# Patient Record
Sex: Male | Born: 1945 | Race: White | Hispanic: No | Marital: Married | Smoking: Former smoker
Health system: Southern US, Community
[De-identification: ages and names within clinical notes are randomized; demographics above are authoritative.]

## PROBLEM LIST (undated history)

## (undated) DIAGNOSIS — I10 Essential (primary) hypertension: Secondary | ICD-10-CM

## (undated) DIAGNOSIS — M1990 Unspecified osteoarthritis, unspecified site: Secondary | ICD-10-CM

## (undated) DIAGNOSIS — Z87442 Personal history of urinary calculi: Secondary | ICD-10-CM

## (undated) DIAGNOSIS — C641 Malignant neoplasm of right kidney, except renal pelvis: Secondary | ICD-10-CM

## (undated) DIAGNOSIS — G8929 Other chronic pain: Secondary | ICD-10-CM

## (undated) DIAGNOSIS — M545 Low back pain, unspecified: Secondary | ICD-10-CM

## (undated) DIAGNOSIS — C61 Malignant neoplasm of prostate: Secondary | ICD-10-CM

## (undated) DIAGNOSIS — M4800 Spinal stenosis, site unspecified: Secondary | ICD-10-CM

## (undated) DIAGNOSIS — E781 Pure hyperglyceridemia: Secondary | ICD-10-CM

## (undated) DIAGNOSIS — B1910 Unspecified viral hepatitis B without hepatic coma: Secondary | ICD-10-CM

## (undated) HISTORY — PX: CYST EXCISION: SHX5701

## (undated) HISTORY — PX: FEMUR SURGERY: SHX943

## (undated) HISTORY — PX: JOINT REPLACEMENT: SHX530

## (undated) HISTORY — PX: FRACTURE SURGERY: SHX138

---

## 1980-03-04 HISTORY — PX: MASTOIDECTOMY: SHX711

## 1985-03-04 HISTORY — PX: FINGER SURGERY: SHX640

## 1999-08-22 ENCOUNTER — Encounter: Payer: Self-pay | Admitting: Orthopedic Surgery

## 1999-08-29 ENCOUNTER — Encounter: Payer: Self-pay | Admitting: Orthopedic Surgery

## 1999-08-29 ENCOUNTER — Inpatient Hospital Stay (HOSPITAL_COMMUNITY): Admission: RE | Admit: 1999-08-29 | Discharge: 1999-09-03 | Payer: Self-pay | Admitting: Orthopedic Surgery

## 2000-03-04 HISTORY — PX: TOTAL HIP ARTHROPLASTY: SHX124

## 2005-12-17 ENCOUNTER — Ambulatory Visit (HOSPITAL_COMMUNITY): Admission: RE | Admit: 2005-12-17 | Discharge: 2005-12-17 | Payer: Self-pay | Admitting: Orthopedic Surgery

## 2006-03-04 DIAGNOSIS — B1910 Unspecified viral hepatitis B without hepatic coma: Secondary | ICD-10-CM

## 2006-03-04 HISTORY — DX: Unspecified viral hepatitis B without hepatic coma: B19.10

## 2006-03-04 HISTORY — PX: INGUINAL HERNIA REPAIR: SUR1180

## 2006-10-03 DIAGNOSIS — C61 Malignant neoplasm of prostate: Secondary | ICD-10-CM

## 2006-10-03 HISTORY — DX: Malignant neoplasm of prostate: C61

## 2006-10-03 HISTORY — PX: PROSTATECTOMY: SHX69

## 2008-03-04 HISTORY — PX: CARPAL TUNNEL RELEASE: SHX101

## 2008-10-26 ENCOUNTER — Ambulatory Visit (HOSPITAL_BASED_OUTPATIENT_CLINIC_OR_DEPARTMENT_OTHER): Admission: RE | Admit: 2008-10-26 | Discharge: 2008-10-26 | Payer: Self-pay | Admitting: Orthopedic Surgery

## 2010-06-09 LAB — POCT I-STAT, CHEM 8
BUN: 27 mg/dL — ABNORMAL HIGH (ref 6–23)
Calcium, Ion: 1.11 mmol/L — ABNORMAL LOW (ref 1.12–1.32)
Chloride: 102 mEq/L (ref 96–112)
Creatinine, Ser: 0.8 mg/dL (ref 0.4–1.5)
Glucose, Bld: 103 mg/dL — ABNORMAL HIGH (ref 70–99)
HCT: 41 % (ref 39.0–52.0)
Hemoglobin: 13.9 g/dL (ref 13.0–17.0)
Potassium: 2.8 mEq/L — ABNORMAL LOW (ref 3.5–5.1)
Sodium: 141 mEq/L (ref 135–145)
TCO2: 30 mmol/L (ref 0–100)

## 2010-07-17 NOTE — Op Note (Signed)
NAME:  Jonathan Harvey, Jonathan Harvey NO.:  [id number]   MEDICAL RECORD NO.:  [id number]          PATIENT TYPE:  AMB   LOCATION:  DSC                          FACILITY:  [facility]   PHYSICIAN:  Feliberto Gottron. Turner Daniels, M.D.   DATE OF BIRTH:  August 20, 1945   DATE OF PROCEDURE:  10/26/2008  DATE OF DISCHARGE:                               OPERATIVE REPORT   PREOPERATIVE DIAGNOSIS:  Right carpal tunnel syndrome.   POSTOPERATIVE DIAGNOSIS:  Right carpal tunnel syndrome.   PROCEDURE:  Right carpal tunnel release.   SURGEON:  Feliberto Gottron. Turner Daniels, MD   FIRST ASSISTANT:  Shirl Harris, PA-C   ANESTHESIA:  Bier block.   ESTIMATED BLOOD LOSS:  Minimal.   FLUID REPLACEMENT:  500 mL of crystalloid.   DRAINS PLACED:  None.   TOURNIQUET TIME:  None.   INDICATIONS FOR PROCEDURE:  A 65 year old gentleman with fairly classic  clinical right carpal tunnel syndrome who has failed conservative  treatment with observation, splinting, anti-inflammatory medicines.  It  wakes him up 7/7 nights per week.  He desires elective right carpal  tunnel release to decrease pain and increase functioning.  He does not  have any thenar muscle atrophy.  Risks and benefits of surgery were  discussed, questions answered.   DESCRIPTION OF PROCEDURE:  The patient identified by armband and taken  to the operating room at [facility] after receiving  preoperative IV antibiotics.  Tourniquet was applied high to the right  arm, and right Bier block anesthetic was induced.  The right upper  extremity prepped and draped in the usual sterile fashion from the  fingertips to the mid forearm and time-out procedure performed.  We then  began the operation itself by making a palmar longitudinal incision  starting at the wrist flexion crease going distally for about 3 or 4 cm  through the skin and subcutaneous tissue.  We then made a small incision  in the palmar fascia distally allowing passage of a Therapist, nutritional into  the carpal tunnel.  We then cut down the [place], which was kept  on the volar aspect of the tunnel to protect the nerve performing the  carpal tunnel release, which was then extended up into the forearm  fascia with a black handled scissors.  This allowed [place] to thoroughly  probed the carpal tunnel.  The median nerve was noted to be intact.  There was an area where it was necked down into an hour glass  configuration as the one under the transverse carpal ligament.  At this  point, the wound was irrigated out with normal saline solution and then  the skin  only was closed with running 4-0 nylon suture, and a dressing of  Xeroform, 4 x 4 dressing sponges, Webril, and an Ace wrap applied.  The  patient was then taken to the recovery room without difficulty after the  tourniquet was let down.      Feliberto Gottron. Turner Daniels, M.D.  Electronically Signed     FJR/MEDQ  D:  10/26/2008  T:  10/27/2008  Job:  [id number] 

## 2010-07-20 NOTE — Op Note (Signed)
[place]  Patient:    CASMIR, AUGUSTE                         MRN: [id number] Proc. Date: 08/29/99 Adm. 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  It was fixed with Knowles pins that are still in place. Because of increasing pain and stiffness and inability to ambulate, as well as about a 1-1/2 inch leg length discrepancy, half that being an apparent leg length discrepancy, desires total hip arthroplasty.  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  It was fixed with Knowles pins that are still in place.  Because of increasing pain and stiffness and inability to ambulate, as well as about a 1-1/2 inch leg length discrepancy, half that being an apparent leg length discrepancy, desires total hip arthroplasty.  PROCEDURE:  Left total hip arthroplasty after removal of the Knowles pins.  We used the SROM system for the stem side with a 20 x 15 x 42 x 160 stem.  A 20S large cone, a NK+0 28 mm Cobalt chrome ball, a 52 mm Depuy dual locking cup with a 10 degree liner index posterior and superior.  ANESTHESIA:  General endotracheal.  SURGEON:  Alinda Deem, M.D.  FIRST ASSISTANT:  Dorthula Matas, P.A.-C.  SECOND ASSISTANT:  Mamie Levers, P.A. student.  ESTIMATED BLOOD LOSS:  750 cc.  FLUID REPLACEMENT:  2600 cc of crystalloid.  DRAINS PLACED:  Two medium Hemovacs and a Foley catheter.  URINE OUTPUT:  300 cc.  INDICATIONS FOR PROCEDURE:  A 65 year old man with severe degenerative arthritis of the left hip.  He has failed conservative treatment and anti-inflammatory  medicines, judicious use of narcotics, and use of a cane. He has had this arthritis for many, many years with a large amount of hypertrophic _______ around the pelvis.  He desires elective total hip arthroplasty to decrease pain and increase functioning, and he has first ______ motion at the hip joint as it is now.  DESCRIPTION OF PROCEDURE:  The patient was identified by arm band and taken to the operating room at [facility].  Appropriate anesthetic monitors were attached and general endotracheal anesthesia induced with the patient in the supine position.  A Foley catheter was placed.  He was rolled up on to his right side and _________ Loraine Leriche II pelvic clamp in the right lateral decubitus position.  The left lower extremity was then prepped and draped in the usual sterile fashion from the ankle to the hemipelvis, and the skin along the lateral hip and thigh infiltrated with 20 cc of 0.5% Marcaine and epinephrine solution.  A 25 cm incision centered over the greater trochanter, allowing a lateral to posterolateral approach was then made through the skin and subcutaneous tissue down to the level of the iliotibial band as it ran over the greater trochanter.  This was then incised in line with the skin incision, exposing the greater trochanter and the vastus lateralis.  We were able to palpate the screw  heads of the Knowles pins through the vastus lateralis and these fibers were split, and then using a chisel, we had to cut out two of the screw heads and then using the Knowles pin and wrench, extracted the screws without difficulty.  We then directed attention to the posterior aspect of the hip and took down the posterior capsule, at least what was left of it.  There was virtually no capsule and carved out large amounts of osteophytes, some of them, almost an inch in size from the posterior aspect of the acetabulum and the femoral head and neck region.  Once this had been accomplished, we  were actually able to dislocate the femoral head and also the large anterior osteophytes were removed as well.  Once we had carved off, the osteophytes were identified in the femoral neck, and performed a standard neck cut one fingerbreadth above the lesser trochanter, removing the huge femoral head that was about double the normal volume of the femoral head one would expect.  The patient actually had a pseudoacetabulum that had a good 1/4 inch thick bone around most of the rim, and after trimming it so that it was anteverted about 15-20 degrees, we then sequentially reamed the pseudoacetabulum up to 56 mm, obtaining good bony contact in all areas.  We reamed up to 55 mm with the hemispherical reamer and then hammered into place a 56 mm dual lock shell in 20 degrees of anteversion, and placed a 10 degree liner in the shell after placing the occluder first. An excellent fit was accomplished and no supplemental screws were necessary.  We continued to trim excess bone from around the periphery and the acetabulum, and then flexed and internally rotated the proximal femur, and once again removed huge osteophytes from around the femoral neck, some of them almost 3/4 of an inch in width.  We identified the old femoral neck using the box chisel at the proximal femur followed by sequential reaming up to a 15 mm axial reamer to the appropriate depth through an SROM stem, and then reamed half way down with a 15.5 cylindrical reamer.  Conical reaming was then accomplished up to a 20S cone followed by calcar milling to a 20 large calcar. A 20S large trial cone was then placed with a 15 mm diameter stem x 42 base neck, and NK+0 and NK+6, 28 mm trial balls were placed.  The NK+6 gave [place] the best leg length recovery, although he still probably is a good half inch short.  The hip was stable at 90 of flexion of 60 of internal rotation, and full extension and external rotation of about 30 degrees, and could  not be dislocated anteriorly.  At this point, the trial components were removed and the femur was once again sounded with the cone extractor, and then a 46F large ZTT cone was hammered  into place followed by a 20 x 15 x 42 x 160 stem in the same version as the cone, and an NK+6, 28 mm Cobalt chrome ball was hammered on the stem.  The hip once again reduced and stability found to be excellent.  Because of the amount of dissection and removal of heterotopic bone, medium Hemovacs were placed in the wound to help remove excess bone.  The wound was thoroughly irrigated with normal saline solution.  There was no posterior capsule repair.  The iliotibial band was repaired with running #1 Vicryl suture, the subcutaneous tissue with 0 and 2-0 undyed Vicryl suture, the  skin with running interlocking 3-0 nylon suture.  A dressing of Xeroform followed by a dressing, sponges, and Hypafix tape applied.  The patient was rolled supine and an abduction pillow placed.  He was awakened and taken to the recovery room without difficulty. DD:  08/29/99 TD:  08/30/99 Job: [id number]

## 2010-07-20 NOTE — Discharge Summary (Signed)
[place]  Patient:    Jonathan Harvey, Jonathan Harvey                         MRN: [id number] Adm. Date:  [id number] Disc. Date: [id number] Attending:  Alinda Deem Dictator:   Dorthula Matas, P.A.-C.                           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  SECONDARY DIAGNOSES: 1. History of viral hepatitis B. 2. Hypertension.  PROCEDURES:  Left total hip arthroplasty.  DISCHARGE SUMMARY/INTRODUCTION:  The patient is a 65 year old male admitted for treatment of end-stage DJD of the left hip.  HISTORY OF PRESENT ILLNESS:  The patient has a multi-year history of increasing pain of the left hip after an injury as a child, much worse x the last 20 months.  He has trouble sleeping.  He cannot do house chores and cannot work.  He has start up pain and profound limp.  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  The patient uses a cane or crutch.  He cannot do exercises because of pain.  Unable to fully extend hip.  The risks and benefits of total hip arthroplasty were reviewed and the patient understands and desires left total hip.  X-ray showed positive femoral head collapse with osteophytes and DJD changes with positive retained pins from previous surgery.  SOCIAL HISTORY:  Occasional ethanol.  No tobacco.  No IV drug abuse.  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  PAST MEDICAL HISTORY:  Normal childhood diseases.  FAMILY HISTORY:  Mother is alive and well at age 70 with a history of cardiac disease and DJD.  Father alive and well at age 68 with a history of CVA and hypertension.  ADULT DISEASES:  Hypertension and DJD.  PAST SURGICAL HISTORY:  Hip surgery in 1962.  Mastoidectomy in 1980.  Leg fracture in 1957.  ALLERGIES:  No known  difficulties with general endotracheal anesthesia.  No known drug allergies.  MEDICATIONS ON ADMISSION: 1. Vioxx 25 mg 1 p.o. q.d. 2. Hyzaar 100/25 1 p.o. q.d. 3. Rondec-TR SA 1 p.o. q.d. 4. Tetracycline 500 mg 1 p.o. q.d. 5. Aspirin 80 mg 1 p.o. q.d.  REVIEW OF SYSTEMS:  Positive for glasses and decreased hearing.  Denies shortness of breath, chest pain, or bleeding from mouth or rectum.  No recent UTIs, free bleeding, or glandular diseases.  PHYSICAL EXAMINATION:  VITAL SIGNS:  The patient is a 7 foot 1 inch, 190 pound male.  Blood pressure 140/88.  HEENT:  Head is normocephalic, atraumatic.  Eyes:  PERRLA, ______.  Ears: TMs are clear.  Nose is clear.  No polyps.  Throat is clear.  NECK:  Supple.  Full range of motion.  RESPIRATORY:  Clear to auscultation and percussion.  CARDIOVASCULAR:  Regular rate and rhythm.  No murmurs, rubs, or gallops.  ABDOMEN:  Soft, nontender.  No masses.  Bowel sounds 2+.  GENITOURINARY/RECTAL:  Not performed.  NEUROLOGIC:  Alert and oriented x 3.  Normal speech pattern.  Cranial nerves II-XII grossly intact.  ORTHOPEDIC:  Left hip 30 degree forward flexion retracture, ______ attempts at motion and foot tap.  Range of motion of the left hip internal rotation 10, right hip internal rotation 30.  External rotation left is 30 and right 45. Abduction 20 and 40.  Adduction 10 and 20.  Neurovascularly intact to light touch and pinprick.  DTRs 2+.  Pulses distally 2+.  LABORATORY DATA:  Preoperative labs, including CBC, CMET, chest x-ray, EKG, PT, and PTT were all within normal limits with the exception of EKG which showed right bundle branch block.  HOSPITAL COURSE:  On the day of admission, the patient was taken to the operating room at [facility], where he underwent a left total hip arthroplasty after removal of old pins from the left hip.  The patient received an ______ system with a stem 20 x 15 x 42 x 160, a 20 S large  cone, an NK+0 20 mm cobalt chrome ball, a 52 mm dual locking cup by DePuy, and a 10-degree liner index posterior and superior.  The patient was placed on perioperative antibiotics.  He was placed on postoperative Coumadin per pharmacy protocol.  Foley catheter was placed in bladder and medium Hemovac double arm was placed in the wound.  He was begun on postoperative PT the day of surgery.  He was placed on postoperative PCA for pain control. First postoperative day, the patient was awake and alert in moderate pain. Vital signs were stable.  He was afebrile, hemoglobin 10, neurovascularly intact.  Dressing was dry and otherwise doing well postoperatively.  The second postoperative day, the patient was without complaint.  ______ and physical therapy.  He was afebrile.  Vital signs were stable.  PT 19.1, INR of 2.1.  The dressing was dry.  Hemovac was quiescent and discontinued without difficulty.  Continue with physical therapy.  The third postoperative day, the patient was without complaint, taking p.o., and voiding without difficulty. Afebrile, vital signs stable.  The wound was benign, otherwise neurovascularly intact.  Hemoglobin 8.1, hematocrit 21.9, INR 2.2.  The dressing was changed. Continue with Coumadin and discharge planning continued.  The fourth postoperative day, the patient was awake and alert.  Decreased pain, otherwise improving.  On the fifth postoperative day, the patient was awake and alert and in moderate pain.  He was afebrile.  The wound was clean and dry.  He was neurovascularly intact and medically stable.  He was discharged home to the care of his family on that date.  ACTIVITY:  He is to continue being weight-bearing as tolerated with total hip precautions on the left side.  WOUND CARE:  He will do dressing changes q.d.  FOLLOW-UP:  Return to see [place] in one weeks time for followup check and possible suture removal.  DISCHARGE MEDICATIONS:  He will continue with  Coumadin per pharmacy protocol  for two weeks postoperatively.  He was given Tylox for pain control and will resume home preoperative medications.  DISCHARGE INSTRUCTIONS:  He should return to see [place] in one weeks time, as mentioned, or sooner if he is having difficulty with drainage, increased pain, or fever over 101. DD:  09/17/99 TD:  09/17/99 Job: [id number]

## 2012-01-10 DIAGNOSIS — M4802 Spinal stenosis, cervical region: Secondary | ICD-10-CM | POA: Insufficient documentation

## 2012-07-01 ENCOUNTER — Ambulatory Visit
Admission: RE | Admit: 2012-07-01 | Discharge: 2012-07-01 | Disposition: A | Discharge status: Home / Self Care | Payer: BC Managed Care – PPO | Source: Ambulatory Visit | Attending: Orthopedic Surgery | Admitting: Orthopedic Surgery

## 2012-07-01 ENCOUNTER — Other Ambulatory Visit: Payer: Self-pay | Admitting: Orthopedic Surgery

## 2012-07-01 DIAGNOSIS — C61 Malignant neoplasm of prostate: Secondary | ICD-10-CM | POA: Insufficient documentation

## 2012-07-01 DIAGNOSIS — N529 Male erectile dysfunction, unspecified: Secondary | ICD-10-CM | POA: Insufficient documentation

## 2012-07-01 DIAGNOSIS — R102 Pelvic and perineal pain: Secondary | ICD-10-CM

## 2012-07-01 DIAGNOSIS — R3129 Other microscopic hematuria: Secondary | ICD-10-CM | POA: Insufficient documentation

## 2015-09-19 ENCOUNTER — Other Ambulatory Visit: Payer: Self-pay | Admitting: Orthopedic Surgery

## 2015-09-19 ENCOUNTER — Ambulatory Visit
Admission: RE | Admit: 2015-09-19 | Discharge: 2015-09-19 | Disposition: A | Discharge status: Home / Self Care | Payer: Medicare Other | Source: Ambulatory Visit | Attending: Orthopedic Surgery | Admitting: Orthopedic Surgery

## 2015-09-19 DIAGNOSIS — M8950 Osteolysis, unspecified site: Secondary | ICD-10-CM

## 2016-03-20 ENCOUNTER — Ambulatory Visit: Payer: Medicare Other | Admitting: Physical Therapy

## 2016-03-26 ENCOUNTER — Ambulatory Visit: Payer: Medicare Other | Attending: Orthopedic Surgery | Admitting: Physical Therapy

## 2016-03-26 DIAGNOSIS — M25661 Stiffness of right knee, not elsewhere classified: Secondary | ICD-10-CM | POA: Diagnosis present

## 2016-03-26 DIAGNOSIS — M25561 Pain in right knee: Secondary | ICD-10-CM | POA: Diagnosis not present

## 2016-03-26 DIAGNOSIS — G8929 Other chronic pain: Secondary | ICD-10-CM | POA: Diagnosis present

## 2016-03-26 NOTE — Therapy (Signed)
[facility] [place], [place], [address] Phone: [phone]   Fax:  [phone]  Physical Therapy Evaluation  Patient Details  Name: Jonathan Harvey MRN: [id number] Date of Birth: October 16, 1945 Referring Provider: [name] MD.  Encounter Date: 03/26/2016      PT End of Session - 03/26/16 1339    Visit Number 1   Number of Visits 8   Date for PT Re-Evaluation 04/23/16   PT Start Time 1115   PT Stop Time 1204   PT Time Calculation (min) 49 min   Activity Tolerance Patient tolerated treatment well   Behavior During Therapy [facility] for tasks assessed/performed      No past medical history on file.  No past surgical history on file.  There were no vitals filed for this visit.       Subjective Assessment - 03/26/16 1323    Pertinent History Sciatica.  Leg length discrepancy.  Left knee pain.            [facility] PT Assessment - 03/26/16 0001      Assessment   Medical Diagnosis Right knee pain.   Referring Provider [name] MD.   Onset Date/Surgical Date --  Years.     Precautions   Precautions None     Restrictions   Weight Bearing Restrictions No     Balance Screen   Has the patient fallen in the past 6 months No   Has the patient had a decrease in activity level because of a fear of falling?  No   Is the patient reluctant to leave their home because of a fear of falling?  No     Home Environment   Living Environment Private residence     Prior Function   Level of Independence Independent     Observation/Other Assessments   Observations --  Left shoe lift.  Severe loss of RT patellar mobility.     Posture/Postural Control   Posture Comments Bilateral hip ER.  Right tibial rotation.  Right patella sits laterally.  Right genu valgum.  Right LE significantly longer than left.     ROM / Strength   AROM / PROM / Strength AROM;Strength     AROM   Overall AROM Comments -25 degrees of right knee  extension and flexion= 95 degrees and right hip flexion= 105 degrees.     Strength   Overall Strength Comments Right hip strength= 4-/5; right knee strength is a solid 4+/5.     Palpation   Palpation comment Pain "in" right knee.     Special Tests    Special Tests --  Valgus instability.     Transfers   Transfers --  Independent.     Ambulation/Gait   Gait Comments The patient ambulating with a cane with right knee in excessiove valgum and right tibial ER with a Trendelenburg gait pattern noted.                     [facility] [facility] PT Treatment/Exercise - 03/26/16 0001      Exercises   Exercises Knee/Hip     Knee/Hip Exercises: Aerobic   Nustep Level 3 x 15 minutes moving forward x 1 to increase knee flexion.                  PT Short Term Goals - 03/26/16 1305      PT SHORT TERM GOAL #1   Title STG's=LTG's.  PT Long Term Goals - 03/26/16 1305      PT LONG TERM GOAL #1   Title Independent with a HEP prior to surgery next month.   Time 3   Period Weeks   Status New               Plan - 03/26/16 1330    Clinical Impression Statement The patient presents to OPPT with a great deal of right knee pain.  His pain is increasing and his function is decreasing.  He has a significant loss of right hip strength and loss of both right knee flexion and extension.  He has a signifcant gait deviation, leg length discrepancy and right knee instability and a severe loss of right patellar mobility all of which decrease his level of function.   Rehab Potential Good   Clinical Impairments Affecting Rehab Potential Right knee flexion contracture.   PT Frequency 2x / week   PT Duration 3 weeks   PT Treatment/Interventions ADLs/Self Care Home Management;Therapeutic activities;Therapeutic exercise;Neuromuscular re-education;Patient/family education;Manual techniques   PT Next Visit Plan Nustep; Left knee ROM; right hip strengthening.      Patient  will benefit from skilled therapeutic intervention in order to improve the following deficits and impairments:  Abnormal gait, Decreased activity tolerance, Decreased range of motion, Decreased strength, Difficulty walking, Pain  Visit Diagnosis: Chronic pain of right knee - Plan: PT plan of care cert/re-cert  Stiffness of right knee, not elsewhere classified - Plan: PT plan of care cert/re-cert      G-Codes - [id number]    Functional Assessment Tool Used 51% limitation.   Functional Limitation Mobility: Walking and moving around   Mobility: Walking and Moving Around Current Status [phone]) At least 40 percent but less than 60 percent impaired, limited or restricted   Mobility: Walking and Moving Around Goal Status [phone]) At least 20 percent but less than 40 percent impaired, limited or restricted       Problem List There are no active problems to display for this patient.   Juli Odom, [place] MPT 03/26/2016, 1:47 PM  [facility] [address] [place], [place], [address] Phone: [phone]   Fax:  [phone]  Name: Jonathan Harvey MRN: [id number] Date of Birth: 20-Mar-1945

## 2016-03-29 ENCOUNTER — Ambulatory Visit: Payer: Medicare Other | Admitting: Physical Therapy

## 2016-03-29 DIAGNOSIS — M25561 Pain in right knee: Secondary | ICD-10-CM | POA: Diagnosis not present

## 2016-03-29 DIAGNOSIS — M25661 Stiffness of right knee, not elsewhere classified: Secondary | ICD-10-CM

## 2016-03-29 DIAGNOSIS — G8929 Other chronic pain: Secondary | ICD-10-CM

## 2016-03-29 NOTE — Therapy (Signed)
[facility] [place], [place], [address] Phone: [phone]   Fax:  [phone]  Physical Therapy Treatment  Patient Details  Name: Jonathan Harvey MRN: [id number] Date of Birth: 1946-03-02 Referring Provider: [name] MD.  Encounter Date: 03/29/2016      PT End of Session - 03/29/16 1010    Visit Number 2   Number of Visits 8   Date for PT Re-Evaluation 04/23/16   PT Start Time 0910      No past medical history on file.  No past surgical history on file.  There were no vitals filed for this visit.  Treatment:  Nustep level 4 x 15 minutes; supine 5# overpressure stretch x 5 minute; rockerboard x 5 minutes; 10# knee extension stretch x 4 minutes                               PT Short Term Goals - 03/26/16 1305      PT SHORT TERM GOAL #1   Title STG's=LTG's.           PT Long Term Goals - 03/26/16 1305      PT LONG TERM GOAL #1   Title Independent with a HEP prior to surgery next month.   Time 3   Period Weeks   Status New             Patient will benefit from skilled therapeutic intervention in order to improve the following deficits and impairments:     Visit Diagnosis: Chronic pain of right knee  Stiffness of right knee, not elsewhere classified     Problem List There are no active problems to display for this patient.   Rion Schnitzer, [place] MPT 03/29/2016, 10:12 AM  [facility] [address] [place], [place], [address] Phone: [phone]   Fax:  [phone]  Name: Jonathan Harvey MRN: [id number] Date of Birth: 03-20-1945

## 2016-04-01 ENCOUNTER — Ambulatory Visit: Payer: Medicare Other | Admitting: Physical Therapy

## 2016-04-01 ENCOUNTER — Encounter: Payer: Self-pay | Admitting: Physical Therapy

## 2016-04-01 DIAGNOSIS — M25561 Pain in right knee: Principal | ICD-10-CM

## 2016-04-01 DIAGNOSIS — G8929 Other chronic pain: Secondary | ICD-10-CM

## 2016-04-01 DIAGNOSIS — M25661 Stiffness of right knee, not elsewhere classified: Secondary | ICD-10-CM

## 2016-04-01 NOTE — Therapy (Signed)
[facility] [place], [place], [address] Phone: [phone]   Fax:  [phone]  Physical Therapy Treatment  Patient Details  Name: Jonathan Harvey MRN: [id number] Date of Birth: 02-29-1946 Referring Provider: [name] MD.  Encounter Date: 04/01/2016      PT End of Session - 04/01/16 1522    Visit Number 3   Number of Visits 8   Date for PT Re-Evaluation 04/23/16   PT Start Time [id number]   PT Stop Time 1528   PT Time Calculation (min) 44 min   Activity Tolerance Patient tolerated treatment well   Behavior During Therapy [place] for tasks assessed/performed      History reviewed. No pertinent past medical history.  History reviewed. No pertinent surgical history.  There were no vitals filed for this visit.      Subjective Assessment - 04/01/16 1448    Subjective Patient going to see ortho MD tomorrow re: left hip pain/previous THR (bone growing over onto femoral head component) ongoing pain knee in right LE   Pertinent History Sciatica.  Leg length discrepancy.  Left knee pain.   Limitations Walking   How long can you walk comfortably? Short community distances.   Patient Stated Goals Reduce overall pain.   Currently in Pain? Yes   Pain Score 4    Pain Location Knee   Pain Orientation Right   Pain Descriptors / Indicators Aching   Pain Type Chronic pain   Pain Onset More than a month ago   Pain Frequency Constant   Aggravating Factors  prolong activity   Pain Relieving Factors at rest            [facility] PT Assessment - 04/01/16 0001      ROM / Strength   AROM / PROM / Strength PROM;AROM     AROM   AROM Assessment Site Knee   Right/Left Knee Right   Right Knee Extension -19   Right Knee Flexion 95     PROM   PROM Assessment Site Knee   Right/Left Knee Right   Right Knee Extension -14   Right Knee Flexion 105                     [facility] Adult PT Treatment/Exercise - 04/01/16 0001       Knee/Hip Exercises: Aerobic   Nustep 37min L4 UE/LE     Knee/Hip Exercises: Standing   Lateral Step Up 2 sets;Right;10 reps;Step Height: 6"   Forward Step Up Right;2 sets;10 reps;Step Height: 6"   Rocker Board 3 minutes     Knee/Hip Exercises: Supine   Other Supine Knee/Hip Exercises hip abd x20 with red t-band     Knee/Hip Exercises: Sidelying   Hip ABduction Strengthening;Right;10 reps;1 set     Manual Therapy   Manual Therapy Passive ROM   Passive ROM gentle PROM to right knee flexion and ext with gentle holds                  PT Short Term Goals - 03/26/16 1305      PT SHORT TERM GOAL #1   Title STG's=LTG's.           PT Long Term Goals - 04/01/16 1523      PT LONG TERM GOAL #1   Title Independent with a HEP prior to surgery next month.   Time 3   Period Weeks   Status On-going  Plan - 04/01/16 1523    Clinical Impression Statement Patient tolerated treatment well today. Patient reported less pain with exercises. Patient able to complete all exercises with no difficulty. Patient has reported doing self stretches per MPT instruction. Current goal ongoing.    Rehab Potential Good   Clinical Impairments Affecting Rehab Potential Right knee flexion contracture.   PT Frequency 2x / week   PT Duration 3 weeks   PT Treatment/Interventions ADLs/Self Care Home Management;Therapeutic activities;Therapeutic exercise;Neuromuscular re-education;Patient/family education;Manual techniques   PT Next Visit Plan Nustep; Left knee ROM; right hip strengthening.   PT Home Exercise Plan establish HEP next treatment   Consulted and Agree with Plan of Care Patient      Patient will benefit from skilled therapeutic intervention in order to improve the following deficits and impairments:  Abnormal gait, Decreased activity tolerance, Decreased range of motion, Decreased strength, Difficulty walking, Pain  Visit Diagnosis: Chronic pain of right  knee  Stiffness of right knee, not elsewhere classified     Problem List There are no active problems to display for this patient.   Phillips Climes, PTA 04/01/2016, 3:28 PM  [facility] [address] [place], [place], [address] Phone: [phone]   Fax:  [phone]  Name: Jonathan Harvey MRN: [id number] Date of Birth: 18-Dec-1945

## 2016-04-03 ENCOUNTER — Encounter: Payer: Medicare Other | Admitting: Physical Therapy

## 2016-04-10 ENCOUNTER — Other Ambulatory Visit: Payer: Self-pay | Admitting: Orthopedic Surgery

## 2016-04-16 ENCOUNTER — Inpatient Hospital Stay (HOSPITAL_COMMUNITY): Admission: RE | Admit: 2016-04-16 | Payer: Medicare Other | Source: Ambulatory Visit

## 2016-04-18 ENCOUNTER — Encounter (HOSPITAL_COMMUNITY)
Admission: RE | Admit: 2016-04-18 | Discharge: 2016-04-18 | Disposition: A | Discharge status: Home / Self Care | Payer: Medicare Other | Source: Ambulatory Visit | Attending: Orthopedic Surgery | Admitting: Orthopedic Surgery

## 2016-04-18 ENCOUNTER — Encounter (HOSPITAL_COMMUNITY): Admission: RE | Admit: 2016-04-18 | Payer: Medicare Other | Source: Ambulatory Visit

## 2016-04-18 ENCOUNTER — Encounter (HOSPITAL_COMMUNITY): Payer: Self-pay

## 2016-04-18 DIAGNOSIS — I4510 Unspecified right bundle-branch block: Secondary | ICD-10-CM | POA: Insufficient documentation

## 2016-04-18 DIAGNOSIS — Z01818 Encounter for other preprocedural examination: Secondary | ICD-10-CM | POA: Diagnosis not present

## 2016-04-18 DIAGNOSIS — Z01812 Encounter for preprocedural laboratory examination: Secondary | ICD-10-CM | POA: Diagnosis not present

## 2016-04-18 DIAGNOSIS — Z0183 Encounter for blood typing: Secondary | ICD-10-CM | POA: Diagnosis not present

## 2016-04-18 HISTORY — DX: Essential (primary) hypertension: I10

## 2016-04-18 HISTORY — DX: Personal history of urinary calculi: Z87.442

## 2016-04-18 HISTORY — DX: Unspecified osteoarthritis, unspecified site: M19.90

## 2016-04-18 LAB — BASIC METABOLIC PANEL
ANION GAP: 13 (ref 5–15)
BUN: 24 mg/dL — ABNORMAL HIGH (ref 6–20)
CO2: 29 mmol/L (ref 22–32)
Calcium: 9.6 mg/dL (ref 8.9–10.3)
Chloride: 100 mmol/L — ABNORMAL LOW (ref 101–111)
Creatinine, Ser: 1.41 mg/dL — ABNORMAL HIGH (ref 0.61–1.24)
GFR calc non Af Amer: 50 mL/min — ABNORMAL LOW (ref >60)
GFR, EST AFRICAN AMERICAN: 58 mL/min — AB (ref >60)
Glucose, Bld: 100 mg/dL — ABNORMAL HIGH (ref 65–99)
POTASSIUM: 3.2 mmol/L — AB (ref 3.5–5.1)
SODIUM: 142 mmol/L (ref 135–145)

## 2016-04-18 LAB — CBC WITH DIFFERENTIAL/PLATELET
BASOS ABS: 0 10*3/uL (ref 0.0–0.1)
Basophils Relative: 0 %
Eosinophils Absolute: 0.4 10*3/uL (ref 0.0–0.7)
Eosinophils Relative: 8 %
HEMATOCRIT: 38.2 % — AB (ref 39.0–52.0)
Hemoglobin: 13.2 g/dL (ref 13.0–17.0)
LYMPHS PCT: 34 %
Lymphs Abs: 1.9 10*3/uL (ref 0.7–4.0)
MCH: 31.1 pg (ref 26.0–34.0)
MCHC: 34.6 g/dL (ref 30.0–36.0)
MCV: 89.9 fL (ref 78.0–100.0)
Monocytes Absolute: 0.4 10*3/uL (ref 0.1–1.0)
Monocytes Relative: 8 %
NEUTROS ABS: 2.7 10*3/uL (ref 1.7–7.7)
Neutrophils Relative %: 50 %
PLATELETS: 275 10*3/uL (ref 150–400)
RBC: 4.25 MIL/uL (ref 4.22–5.81)
RDW: 13.4 % (ref 11.5–15.5)
WBC: 5.5 10*3/uL (ref 4.0–10.5)

## 2016-04-18 LAB — TYPE AND SCREEN
ABO/RH(D): A POS
ANTIBODY SCREEN: NEGATIVE

## 2016-04-18 LAB — SURGICAL PCR SCREEN
MRSA, PCR: NEGATIVE
Staphylococcus aureus: NEGATIVE

## 2016-04-18 LAB — PROTIME-INR
INR: 1.06
Prothrombin Time: 13.8 seconds (ref 11.4–15.2)

## 2016-04-18 LAB — URINALYSIS, ROUTINE W REFLEX MICROSCOPIC
BILIRUBIN URINE: NEGATIVE
GLUCOSE, UA: NEGATIVE mg/dL
HGB URINE DIPSTICK: NEGATIVE
Ketones, ur: NEGATIVE mg/dL
Leukocytes, UA: NEGATIVE
Nitrite: NEGATIVE
Protein, ur: NEGATIVE mg/dL
SPECIFIC GRAVITY, URINE: 1.009 (ref 1.005–1.030)
pH: 6 (ref 5.0–8.0)

## 2016-04-18 LAB — APTT: APTT: 38 s — AB (ref 24–36)

## 2016-04-18 LAB — ABO/RH: ABO/RH(D): A POS

## 2016-04-18 NOTE — Pre-Procedure Instructions (Signed)
Jonathan Harvey  04/18/2016      [facility] [place], [place] [address] Phone: [phone] Fax: [phone]    Your procedure is scheduled on  04-24-2016   Wednesday .  Report to [facility] Admitting at 10:45 A.M.   Call this number if you have problems the morning of surgery:  [phone]   Remember:  Do not eat food or drink liquids after midnight.   Take these medicines the morning of surgery with A SIP OF WATER Zyrtec,gabapentin(neurontin),metoprolol,Tramadol     Do not wear jewelry,  Do not wear lotions, powders, or perfumes, or deoderant.  Do not shave 48 hours prior to surgery.  Men may shave face and neck.   Do not bring valuables to the hospital.  [facility] is not responsible for any belongings or valuables.  Contacts, dentures or bridgework may not be worn into surgery.  Leave your suitcase in the car.  After surgery it may be brought to your room.  For patients admitted to the hospital, discharge time will be determined by your treatment team.  Patients discharged the day of surgery will not be allowed to drive home.   [place] - Preparing for Surgery  Before surgery, you can play an important role.  Because skin is not sterile, your skin needs to be as free of germs as possible.  You can reduce the number of germs on you skin by washing with CHG (chlorahexidine gluconate) soap before surgery.  CHG is an antiseptic cleaner which kills germs and bonds with the skin to continue killing germs even after washing.  Please DO NOT use if you have an allergy to CHG or antibacterial soaps.  If your skin becomes reddened/irritated stop using the CHG and inform your nurse when you arrive at Short Stay.  Do not shave (including legs and underarms) for at least 48 hours prior to the first CHG shower.  You may shave your face.  Please follow these instructions carefully:   1.  Shower with CHG  Soap the night before surgery and the                                morning of Surgery.  2.  If you choose to wash your hair, wash your hair first as usual with your  normal shampoo.  3.  After you shampoo, rinse your hair and body thoroughly to remove the                      Shampoo.  4.  Use CHG as you would any other liquid soap.  You can apply chg directly  to the skin and wash gently with scrungie or a clean washcloth.  5.  Apply the CHG Soap to your body ONLY FROM THE NECK DOWN.        Do not use on open wounds or open sores.  Avoid contact with your eyes,       ears, mouth and genitals (private parts).  Wash genitals (private parts)       with your normal soap.  6.  Wash thoroughly, paying special attention to the area where your surgery   will be performed.  7.  Thoroughly rinse your body with warm water from the neck down.  8.  DO NOT shower/wash with your normal soap after  using and rinsing off  the CHG Soap.  9.  Pat yourself dry with a clean towel.            10.  Wear clean pajamas.            11.  Place clean sheets on your bed the night of your first shower and do not  sleep with pets.  Day of Surgery  Do not apply any lotions/deoderants the morning of surgery.  Please wear clean clothes to the hospital/surgery center.   Special instructions: STOP ASPIRIN,ANTIINFLAMATORIES (IBUPROFEN,ALEVE,MOTRIN,ADVIL,GOODY'S POWDERS),HERBAL SUPPLEMENTS,FISH OIL,AND VITAMINS 5-7 DAYS PRIOR TO SURGERY  .

## 2016-04-23 MED ORDER — CEFAZOLIN SODIUM-DEXTROSE 2-4 GM/100ML-% IV SOLN
2.0000 g | INTRAVENOUS | Status: AC
  Administered 2016-04-24: 2 g via INTRAVENOUS
  Filled 2016-04-23: qty 100

## 2016-04-23 MED ORDER — TRANEXAMIC ACID 1000 MG/10ML IV SOLN
2000.0000 mg | INTRAVENOUS | Status: AC
  Administered 2016-04-24: 2000 mg via TOPICAL
  Filled 2016-04-23: qty 20

## 2016-04-23 NOTE — H&P (Signed)
TOTAL HIP REVISION ADMISSION H&P  Patient is admitted for left revision total hip arthroplasty.  Subjective:  Chief Complaint: left hip pain  HPI: Jonathan Harvey, 71 y.o. male, has a history of pain and functional disability in the left hip due to arthritis and patient has failed non-surgical conservative treatments for greater than 12 weeks to include NSAID's and/or analgesics, flexibility and strengthening excercises, use of assistive devices and activity modification. The indications for the revision total hip arthroplasty are bearing surface wear leading to  implant or hip misalignment.  Onset of symptoms was gradual starting 10 years ago with gradually worsening course since that time.  Prior procedures on the left hip include arthroplasty.  Patient currently rates pain in the left hip at 10 out of 10 with activity.  There is night pain, worsening of pain with activity and weight bearing, trendelenberg gait, pain that interfers with activities of daily living and pain with passive range of motion. Patient has evidence of joint space narrowing by imaging studies.  This condition presents safety issues increasing the risk of falls.   There is no current active infection.  There are no active problems to display for this patient.  Past Medical History:  Diagnosis Date  . Arthritis   . Blood dyscrasia    prostectomy    . Hepatitis    Hepatitis B  2008  . History of kidney stones   . Hypertension     Past Surgical History:  Procedure Laterality Date  . BREAST LUMPECTOMY Left    lump remove from left arm  . CARPAL TUNNEL RELEASE Right 2010  . curved femur Left   . HAND SURGERY Right 1987   right thumb and index finger  . HERNIA REPAIR  2008   inguineal  . JOINT REPLACEMENT Left 2002   hip replacement  . MASTOIDECTOMY  1982  . radical prostectomy      No prescriptions prior to admission.   Allergies  Allergen Reactions  . Lactose Intolerance (Gi) Other (See Comments)    Mild GI  upset    Social History  Substance Use Topics  . Smoking status: Never Smoker  . Smokeless tobacco: Former Systems developer  . Alcohol use 8.4 oz/week    14 Cans of beer per week    No family history on file.    Review of Systems  Constitutional: Negative.   HENT: Positive for hearing loss.   Eyes: Negative.        Poor vision  Respiratory: Positive for shortness of breath.   Cardiovascular: Negative.   Gastrointestinal: Negative.   Genitourinary: Negative.        Prostate cancer  Musculoskeletal: Positive for joint pain.  Skin: Negative.   Neurological: Negative.   Endo/Heme/Allergies: Negative.   Psychiatric/Behavioral: Negative.     Objective:  Physical Exam  Constitutional: He is oriented to person, place, and time. He appears well-developed and well-nourished.  HENT:  Head: Normocephalic and atraumatic.  Eyes: Pupils are equal, round, and reactive to light.  Neck: Normal range of motion. Neck supple.  Cardiovascular: Intact distal pulses.   Respiratory: Effort normal.  Musculoskeletal: He exhibits tenderness.  he continues to have mild irritability in the left hip with range of motion.  Mild tenderness laterally.  No instability.  Calves are soft and nontender.  He is neurovascularly intact distally.  Neurological: He is alert and oriented to person, place, and time.  Skin: Skin is warm and dry.  Psychiatric: He has a normal mood  and affect. His behavior is normal. Judgment and thought content normal.    Vital signs in last 24 hours:     Labs:   Estimated body mass index is 30.03 kg/m as calculated from the following:   Height as of 04/18/16: 5\' 8"  (1.727 m).   Weight as of 04/18/16: 89.6 kg (197 lb 8 oz).  Imaging Review:  Plain radiographs demonstrate AP of the pelvis and crosstable lateral of the left hip are also taken and reviewed in office today.  Patient does appear to have bearing surface wear with superior migration of the femoral head.  Patient also has an  old greater troche fracture.  Assessment/Plan:  End stage arthritis, left hip(s) with failed previous arthroplasty.  The patient history, physical examination, clinical judgement of the provider and imaging studies are consistent with end stage degenerative joint disease of the left hip(s), previous total hip arthroplasty. Revision total hip arthroplasty is deemed medically necessary. The treatment options including medical management, injection therapy, arthroscopy and arthroplasty were discussed at length. The risks and benefits of total hip arthroplasty were presented and reviewed. The risks due to aseptic loosening, infection, stiffness, dislocation/subluxation,  thromboembolic complications and other imponderables were discussed.  The patient acknowledged the explanation, agreed to proceed with the plan and consent was signed. Patient is being admitted for inpatient treatment for surgery, pain control, PT, OT, prophylactic antibiotics, VTE prophylaxis, progressive ambulation and ADL's and discharge planning. The patient is planning to be discharged home with home health services

## 2016-04-24 ENCOUNTER — Inpatient Hospital Stay (HOSPITAL_COMMUNITY)
Admission: RE | Admit: 2016-04-24 | Discharge: 2016-04-26 | DRG: 467 | Disposition: A | Discharge status: Home Health | Payer: Medicare Other | Source: Ambulatory Visit | Attending: Orthopedic Surgery | Admitting: Orthopedic Surgery

## 2016-04-24 ENCOUNTER — Inpatient Hospital Stay (HOSPITAL_COMMUNITY): Payer: Medicare Other

## 2016-04-24 ENCOUNTER — Inpatient Hospital Stay (HOSPITAL_COMMUNITY): Payer: Medicare Other | Admitting: Certified Registered Nurse Anesthetist

## 2016-04-24 ENCOUNTER — Encounter (HOSPITAL_COMMUNITY)
Admission: RE | Disposition: A | Discharge status: Home Health | Payer: Self-pay | Source: Ambulatory Visit | Attending: Orthopedic Surgery

## 2016-04-24 ENCOUNTER — Encounter (HOSPITAL_COMMUNITY): Payer: Self-pay | Admitting: *Deleted

## 2016-04-24 ENCOUNTER — Inpatient Hospital Stay: Admit: 2016-04-24 | Payer: Medicare Other | Admitting: Orthopedic Surgery

## 2016-04-24 DIAGNOSIS — K759 Inflammatory liver disease, unspecified: Secondary | ICD-10-CM | POA: Diagnosis present

## 2016-04-24 DIAGNOSIS — I10 Essential (primary) hypertension: Secondary | ICD-10-CM | POA: Diagnosis present

## 2016-04-24 DIAGNOSIS — T84061A Wear of articular bearing surface of internal prosthetic left hip joint, initial encounter: Principal | ICD-10-CM | POA: Diagnosis present

## 2016-04-24 DIAGNOSIS — Y838 Other surgical procedures as the cause of abnormal reaction of the patient, or of later complication, without mention of misadventure at the time of the procedure: Secondary | ICD-10-CM | POA: Diagnosis present

## 2016-04-24 DIAGNOSIS — E739 Lactose intolerance, unspecified: Secondary | ICD-10-CM | POA: Diagnosis present

## 2016-04-24 DIAGNOSIS — Z87442 Personal history of urinary calculi: Secondary | ICD-10-CM

## 2016-04-24 DIAGNOSIS — Z87891 Personal history of nicotine dependence: Secondary | ICD-10-CM

## 2016-04-24 DIAGNOSIS — M25552 Pain in left hip: Secondary | ICD-10-CM | POA: Diagnosis present

## 2016-04-24 DIAGNOSIS — M1612 Unilateral primary osteoarthritis, left hip: Secondary | ICD-10-CM | POA: Diagnosis present

## 2016-04-24 DIAGNOSIS — Z01818 Encounter for other preprocedural examination: Secondary | ICD-10-CM

## 2016-04-24 DIAGNOSIS — Z96649 Presence of unspecified artificial hip joint: Secondary | ICD-10-CM

## 2016-04-24 DIAGNOSIS — D62 Acute posthemorrhagic anemia: Secondary | ICD-10-CM | POA: Diagnosis not present

## 2016-04-24 HISTORY — PX: TOTAL HIP REVISION: SHX763

## 2016-04-24 SURGERY — TOTAL HIP REVISION
Anesthesia: General | Site: Hip | Laterality: Left

## 2016-04-24 SURGERY — ARTHROPLASTY, KNEE, TOTAL
Anesthesia: Spinal | Laterality: Right

## 2016-04-24 MED ORDER — HYDROMORPHONE HCL 1 MG/ML IJ SOLN: 0.2500 mg | INTRAMUSCULAR | Status: DC | PRN

## 2016-04-24 MED ORDER — ALUMINUM HYDROXIDE GEL 320 MG/5ML PO SUSP
15.0000 mL | ORAL | Status: DC | PRN
  Filled 2016-04-24: qty 30

## 2016-04-24 MED ORDER — DOCUSATE SODIUM 100 MG PO CAPS
100.0000 mg | ORAL_CAPSULE | Freq: Two times a day (BID) | ORAL | Status: DC
  Administered 2016-04-24 – 2016-04-26 (×4): 100 mg via ORAL
  Filled 2016-04-24 (×4): qty 1

## 2016-04-24 MED ORDER — ACETAMINOPHEN 650 MG RE SUPP: 650.0000 mg | Freq: Four times a day (QID) | RECTAL | Status: DC | PRN

## 2016-04-24 MED ORDER — ONDANSETRON HCL 4 MG PO TABS: 4.0000 mg | ORAL_TABLET | Freq: Four times a day (QID) | ORAL | Status: DC | PRN

## 2016-04-24 MED ORDER — ONDANSETRON HCL 4 MG/2ML IJ SOLN: 4.0000 mg | Freq: Four times a day (QID) | INTRAMUSCULAR | Status: DC | PRN

## 2016-04-24 MED ORDER — MIDAZOLAM HCL 2 MG/2ML IJ SOLN
INTRAMUSCULAR | Status: AC
  Filled 2016-04-24: qty 2

## 2016-04-24 MED ORDER — ACETAMINOPHEN 325 MG PO TABS: 650.0000 mg | ORAL_TABLET | Freq: Four times a day (QID) | ORAL | Status: DC | PRN

## 2016-04-24 MED ORDER — METHOCARBAMOL 500 MG PO TABS
500.0000 mg | ORAL_TABLET | Freq: Four times a day (QID) | ORAL | Status: DC | PRN
  Administered 2016-04-26: 500 mg via ORAL
  Filled 2016-04-24: qty 1

## 2016-04-24 MED ORDER — SUGAMMADEX SODIUM 200 MG/2ML IV SOLN
INTRAVENOUS | Status: DC | PRN
  Administered 2016-04-24: 200 mg via INTRAVENOUS

## 2016-04-24 MED ORDER — OXYCODONE-ACETAMINOPHEN 5-325 MG PO TABS: 1.0000 | ORAL_TABLET | ORAL | 0 refills | Status: DC | PRN

## 2016-04-24 MED ORDER — MIDAZOLAM HCL 2 MG/2ML IJ SOLN
INTRAMUSCULAR | Status: DC | PRN
  Administered 2016-04-24: 2 mg via INTRAVENOUS

## 2016-04-24 MED ORDER — ASPIRIN EC 325 MG PO TBEC
325.0000 mg | DELAYED_RELEASE_TABLET | Freq: Every day | ORAL | Status: DC
  Administered 2016-04-25 – 2016-04-26 (×2): 325 mg via ORAL
  Filled 2016-04-24 (×2): qty 1

## 2016-04-24 MED ORDER — DIPHENHYDRAMINE HCL 12.5 MG/5ML PO ELIX: 12.5000 mg | ORAL_SOLUTION | ORAL | Status: DC | PRN

## 2016-04-24 MED ORDER — ROCURONIUM BROMIDE 10 MG/ML (PF) SYRINGE
PREFILLED_SYRINGE | INTRAVENOUS | Status: DC | PRN
  Administered 2016-04-24: 50 mg via INTRAVENOUS
  Administered 2016-04-24 (×2): 10 mg via INTRAVENOUS

## 2016-04-24 MED ORDER — LIDOCAINE 2% (20 MG/ML) 5 ML SYRINGE
INTRAMUSCULAR | Status: DC | PRN
  Administered 2016-04-24: 100 mg via INTRAVENOUS

## 2016-04-24 MED ORDER — OXYCODONE HCL 5 MG PO TABS
5.0000 mg | ORAL_TABLET | ORAL | Status: DC | PRN
  Administered 2016-04-24 – 2016-04-25 (×3): 10 mg via ORAL
  Administered 2016-04-26: 5 mg via ORAL
  Administered 2016-04-26: 10 mg via ORAL
  Filled 2016-04-24: qty 1
  Filled 2016-04-24 (×4): qty 2

## 2016-04-24 MED ORDER — DEXAMETHASONE SODIUM PHOSPHATE 10 MG/ML IJ SOLN
10.0000 mg | Freq: Once | INTRAMUSCULAR | Status: AC
  Administered 2016-04-25: 10 mg via INTRAVENOUS
  Filled 2016-04-24: qty 1

## 2016-04-24 MED ORDER — TRANEXAMIC ACID 1000 MG/10ML IV SOLN
1000.0000 mg | INTRAVENOUS | Status: AC
  Administered 2016-04-24: 1000 mg via INTRAVENOUS
  Filled 2016-04-24: qty 10

## 2016-04-24 MED ORDER — MENTHOL 3 MG MT LOZG: 1.0000 | LOZENGE | OROMUCOSAL | Status: DC | PRN

## 2016-04-24 MED ORDER — LOSARTAN POTASSIUM 50 MG PO TABS
100.0000 mg | ORAL_TABLET | Freq: Every day | ORAL | Status: DC
  Administered 2016-04-26: 100 mg via ORAL
  Filled 2016-04-24 (×2): qty 2

## 2016-04-24 MED ORDER — ROCURONIUM BROMIDE 50 MG/5ML IV SOSY
PREFILLED_SYRINGE | INTRAVENOUS | Status: AC
  Filled 2016-04-24: qty 5

## 2016-04-24 MED ORDER — LIDOCAINE 2% (20 MG/ML) 5 ML SYRINGE
INTRAMUSCULAR | Status: AC
  Filled 2016-04-24: qty 5

## 2016-04-24 MED ORDER — HYDROCHLOROTHIAZIDE 25 MG PO TABS
25.0000 mg | ORAL_TABLET | Freq: Every day | ORAL | Status: DC
  Administered 2016-04-26: 25 mg via ORAL
  Filled 2016-04-24 (×2): qty 1

## 2016-04-24 MED ORDER — PROPOFOL 10 MG/ML IV BOLUS
INTRAVENOUS | Status: DC | PRN
  Administered 2016-04-24: 150 mg via INTRAVENOUS

## 2016-04-24 MED ORDER — METOCLOPRAMIDE HCL 5 MG PO TABS
5.0000 mg | ORAL_TABLET | Freq: Three times a day (TID) | ORAL | Status: DC | PRN
Start: 2016-04-24 — End: 2016-04-26

## 2016-04-24 MED ORDER — PROPOFOL 10 MG/ML IV BOLUS
INTRAVENOUS | Status: AC
  Filled 2016-04-24: qty 20

## 2016-04-24 MED ORDER — SODIUM CHLORIDE 0.9 % IR SOLN
Status: DC | PRN
  Administered 2016-04-24: 1

## 2016-04-24 MED ORDER — BUPIVACAINE-EPINEPHRINE (PF) 0.5% -1:200000 IJ SOLN
INTRAMUSCULAR | Status: AC
  Filled 2016-04-24: qty 30

## 2016-04-24 MED ORDER — METOCLOPRAMIDE HCL 5 MG/ML IJ SOLN: 5.0000 mg | Freq: Three times a day (TID) | INTRAMUSCULAR | Status: DC | PRN

## 2016-04-24 MED ORDER — LACTATED RINGERS IV SOLN
INTRAVENOUS | Status: DC
  Administered 2016-04-24 (×2): via INTRAVENOUS

## 2016-04-24 MED ORDER — KCL IN DEXTROSE-NACL 20-5-0.45 MEQ/L-%-% IV SOLN
INTRAVENOUS | Status: DC
  Administered 2016-04-24: 21:00:00 via INTRAVENOUS
  Filled 2016-04-24 (×2): qty 1000

## 2016-04-24 MED ORDER — PHENYLEPHRINE HCL 10 MG/ML IJ SOLN
INTRAVENOUS | Status: DC | PRN
  Administered 2016-04-24: 25 ug/min via INTRAVENOUS

## 2016-04-24 MED ORDER — FENTANYL CITRATE (PF) 100 MCG/2ML IJ SOLN
INTRAMUSCULAR | Status: AC
  Filled 2016-04-24: qty 2

## 2016-04-24 MED ORDER — BUPIVACAINE LIPOSOME 1.3 % IJ SUSP
20.0000 mL | INTRAMUSCULAR | Status: AC
  Administered 2016-04-24: 20 mL
  Filled 2016-04-24: qty 20

## 2016-04-24 MED ORDER — ONDANSETRON HCL 4 MG/2ML IJ SOLN
INTRAMUSCULAR | Status: DC | PRN
  Administered 2016-04-24: 4 mg via INTRAVENOUS

## 2016-04-24 MED ORDER — SUCCINYLCHOLINE CHLORIDE 200 MG/10ML IV SOSY
PREFILLED_SYRINGE | INTRAVENOUS | Status: AC
  Filled 2016-04-24: qty 10

## 2016-04-24 MED ORDER — METHOCARBAMOL 1000 MG/10ML IJ SOLN
500.0000 mg | Freq: Four times a day (QID) | INTRAMUSCULAR | Status: DC | PRN
  Filled 2016-04-24: qty 5

## 2016-04-24 MED ORDER — LOSARTAN POTASSIUM-HCTZ 100-25 MG PO TABS: 1.0000 | ORAL_TABLET | Freq: Every day | ORAL | Status: DC

## 2016-04-24 MED ORDER — FLEET ENEMA 7-19 GM/118ML RE ENEM: 1.0000 | ENEMA | Freq: Once | RECTAL | Status: DC | PRN

## 2016-04-24 MED ORDER — PHENOL 1.4 % MT LIQD: 1.0000 | OROMUCOSAL | Status: DC | PRN

## 2016-04-24 MED ORDER — BUPIVACAINE-EPINEPHRINE 0.5% -1:200000 IJ SOLN
INTRAMUSCULAR | Status: DC | PRN
  Administered 2016-04-24: 30 mL

## 2016-04-24 MED ORDER — SODIUM CHLORIDE 0.9 % IV SOLN
2000.0000 mg | INTRAVENOUS | Status: AC
  Administered 2016-04-24: 2000 mg via TOPICAL
  Filled 2016-04-24: qty 20

## 2016-04-24 MED ORDER — PHENYLEPHRINE 40 MCG/ML (10ML) SYRINGE FOR IV PUSH (FOR BLOOD PRESSURE SUPPORT)
PREFILLED_SYRINGE | INTRAVENOUS | Status: DC | PRN
  Administered 2016-04-24 (×3): 80 ug via INTRAVENOUS

## 2016-04-24 MED ORDER — FENTANYL CITRATE (PF) 100 MCG/2ML IJ SOLN
INTRAMUSCULAR | Status: DC | PRN
  Administered 2016-04-24 (×2): 50 ug via INTRAVENOUS

## 2016-04-24 MED ORDER — LORATADINE 10 MG PO TABS
10.0000 mg | ORAL_TABLET | Freq: Every day | ORAL | Status: DC
  Administered 2016-04-24 – 2016-04-26 (×3): 10 mg via ORAL
  Filled 2016-04-24 (×2): qty 1

## 2016-04-24 MED ORDER — METOPROLOL SUCCINATE ER 25 MG PO TB24
25.0000 mg | ORAL_TABLET | Freq: Every day | ORAL | Status: DC
  Administered 2016-04-25 – 2016-04-26 (×2): 25 mg via ORAL
  Filled 2016-04-24 (×2): qty 1

## 2016-04-24 MED ORDER — ASPIRIN EC 325 MG PO TBEC: 325.0000 mg | DELAYED_RELEASE_TABLET | Freq: Two times a day (BID) | ORAL | 0 refills | Status: DC

## 2016-04-24 MED ORDER — TIZANIDINE HCL 2 MG PO TABS: 2.0000 mg | ORAL_TABLET | Freq: Four times a day (QID) | ORAL | 0 refills | Status: DC | PRN

## 2016-04-24 MED ORDER — GABAPENTIN 300 MG PO CAPS
300.0000 mg | ORAL_CAPSULE | Freq: Three times a day (TID) | ORAL | Status: DC
  Administered 2016-04-24 – 2016-04-26 (×5): 300 mg via ORAL
  Filled 2016-04-24 (×5): qty 1

## 2016-04-24 MED ORDER — CELECOXIB 200 MG PO CAPS
200.0000 mg | ORAL_CAPSULE | Freq: Two times a day (BID) | ORAL | Status: DC
  Administered 2016-04-24 – 2016-04-26 (×4): 200 mg via ORAL
  Filled 2016-04-24 (×4): qty 1

## 2016-04-24 MED ORDER — POLYETHYLENE GLYCOL 3350 17 G PO PACK: 17.0000 g | PACK | Freq: Every day | ORAL | Status: DC | PRN

## 2016-04-24 MED ORDER — DEXTROSE-NACL 5-0.45 % IV SOLN: INTRAVENOUS | Status: DC

## 2016-04-24 MED ORDER — BISACODYL 5 MG PO TBEC: 5.0000 mg | DELAYED_RELEASE_TABLET | Freq: Every day | ORAL | Status: DC | PRN

## 2016-04-24 MED ORDER — FUROSEMIDE 20 MG PO TABS
20.0000 mg | ORAL_TABLET | Freq: Every day | ORAL | Status: DC
  Administered 2016-04-24 – 2016-04-26 (×3): 20 mg via ORAL
  Filled 2016-04-24 (×3): qty 1

## 2016-04-24 MED ORDER — PHENYLEPHRINE 40 MCG/ML (10ML) SYRINGE FOR IV PUSH (FOR BLOOD PRESSURE SUPPORT)
PREFILLED_SYRINGE | INTRAVENOUS | Status: AC
  Filled 2016-04-24: qty 10

## 2016-04-24 MED ORDER — HYDROMORPHONE HCL 2 MG/ML IJ SOLN: 1.0000 mg | INTRAMUSCULAR | Status: DC | PRN

## 2016-04-24 SURGICAL SUPPLY — 71 items
BLADE SAW SAG 73X25 THK (BLADE)
BLADE SAW SGTL 73X25 THK (BLADE) IMPLANT
BRUSH FEMORAL CANAL (MISCELLANEOUS) IMPLANT
COVER SURGICAL LIGHT HANDLE (MISCELLANEOUS) ×3 IMPLANT
DRAPE C-ARM 42X72 X-RAY (DRAPES) IMPLANT
DRAPE ORTHO SPLIT 77X108 STRL (DRAPES) ×3
DRAPE PROXIMA HALF (DRAPES) ×3 IMPLANT
DRAPE SURG ORHT 6 SPLT 77X108 (DRAPES) ×1 IMPLANT
DRAPE U-SHAPE 47X51 STRL (DRAPES) ×3 IMPLANT
DRESSING AQUACEL AG SP 3.5X10 (GAUZE/BANDAGES/DRESSINGS) IMPLANT
DRILL BIT 7/64X5 (BIT) ×3 IMPLANT
DRSG AQUACEL AG ADV 3.5X10 (GAUZE/BANDAGES/DRESSINGS) ×3 IMPLANT
DRSG AQUACEL AG SP 3.5X10 (GAUZE/BANDAGES/DRESSINGS) ×3
DURAPREP 26ML APPLICATOR (WOUND CARE) ×3 IMPLANT
ELECT BLADE 4.0 EZ CLEAN MEGAD (MISCELLANEOUS)
ELECT BLADE 6.5 EXT (BLADE) ×2 IMPLANT
ELECT REM PT RETURN 9FT ADLT (ELECTROSURGICAL) ×3
ELECTRODE BLDE 4.0 EZ CLN MEGD (MISCELLANEOUS) IMPLANT
ELECTRODE REM PT RTRN 9FT ADLT (ELECTROSURGICAL) ×1 IMPLANT
EVACUATOR 1/8 PVC DRAIN (DRAIN) IMPLANT
GAUZE SPONGE 4X4 12PLY STRL (GAUZE/BANDAGES/DRESSINGS) ×3 IMPLANT
GAUZE XEROFORM 5X9 LF (GAUZE/BANDAGES/DRESSINGS) ×3 IMPLANT
GLOVE BIO SURGEON STRL SZ7.5 (GLOVE) ×3 IMPLANT
GLOVE BIO SURGEON STRL SZ8.5 (GLOVE) ×6 IMPLANT
GLOVE BIOGEL PI IND STRL 8 (GLOVE) ×2 IMPLANT
GLOVE BIOGEL PI IND STRL 9 (GLOVE) ×1 IMPLANT
GLOVE BIOGEL PI INDICATOR 8 (GLOVE) ×4
GLOVE BIOGEL PI INDICATOR 9 (GLOVE) ×2
GOWN STRL REUS W/ TWL LRG LVL3 (GOWN DISPOSABLE) ×1 IMPLANT
GOWN STRL REUS W/ TWL XL LVL3 (GOWN DISPOSABLE) ×2 IMPLANT
GOWN STRL REUS W/TWL LRG LVL3 (GOWN DISPOSABLE) ×3
GOWN STRL REUS W/TWL XL LVL3 (GOWN DISPOSABLE) ×6
HANDPIECE INTERPULSE COAX TIP (DISPOSABLE) ×3
HEAD SROM 36MM PLUS9 (Hips) IMPLANT
HOOD PEEL AWAY FACE SHEILD DIS (HOOD) ×6 IMPLANT
KIT BASIN OR (CUSTOM PROCEDURE TRAY) ×3 IMPLANT
KIT ROOM TURNOVER OR (KITS) ×3 IMPLANT
LINER MARATHON 41OD 36X56 (Hips) ×2 IMPLANT
MANIFOLD NEPTUNE II (INSTRUMENTS) ×3 IMPLANT
NDL 18GX1X1/2 (RX/OR ONLY) (NEEDLE) IMPLANT
NEEDLE 18GX1X1/2 (RX/OR ONLY) (NEEDLE) ×3 IMPLANT
NEEDLE 22X1 1/2 (OR ONLY) (NEEDLE) ×3 IMPLANT
NS IRRIG 1000ML POUR BTL (IV SOLUTION) ×6 IMPLANT
PACK TOTAL JOINT (CUSTOM PROCEDURE TRAY) ×3 IMPLANT
PAD ARMBOARD 7.5X6 YLW CONV (MISCELLANEOUS) ×6 IMPLANT
PASSER SUT SWANSON 36MM LOOP (INSTRUMENTS) ×2 IMPLANT
PRESSURIZER FEMORAL UNIV (MISCELLANEOUS) IMPLANT
RING LOCK ACET OD 56/68 (Hips) ×2 IMPLANT
SET HNDPC FAN SPRY TIP SCT (DISPOSABLE) IMPLANT
SLEEVE SURGEON STRL (DRAPES) IMPLANT
SPONGE LAP 18X18 X RAY DECT (DISPOSABLE) ×6 IMPLANT
SROM HEAD 36MM PLUS9 (Hips) ×3 IMPLANT
STAPLER VISISTAT 35W (STAPLE) ×3 IMPLANT
SUT ETHIBOND 2 V 37 (SUTURE) ×3 IMPLANT
SUT VIC AB 0 CTX 36 (SUTURE) ×6
SUT VIC AB 0 CTX36XBRD ANTBCTR (SUTURE) ×1 IMPLANT
SUT VIC AB 1 CTX 36 (SUTURE) ×6
SUT VIC AB 1 CTX36XBRD ANBCTR (SUTURE) ×1 IMPLANT
SUT VIC AB 2-0 CT1 27 (SUTURE) ×3
SUT VIC AB 2-0 CT1 TAPERPNT 27 (SUTURE) ×1 IMPLANT
SUT VIC AB 3-0 CT1 27 (SUTURE) ×3
SUT VIC AB 3-0 CT1 TAPERPNT 27 (SUTURE) ×1 IMPLANT
SWAB COLLECTION DEVICE MRSA (MISCELLANEOUS) ×3 IMPLANT
SYR 20ML ECCENTRIC (SYRINGE) ×3 IMPLANT
SYR CONTROL 10ML LL (SYRINGE) ×3 IMPLANT
TOWEL OR 17X24 6PK STRL BLUE (TOWEL DISPOSABLE) ×3 IMPLANT
TOWEL OR 17X26 10 PK STRL BLUE (TOWEL DISPOSABLE) ×3 IMPLANT
TOWER CARTRIDGE SMART MIX (DISPOSABLE) IMPLANT
TRAY FOLEY CATH 14FR (SET/KITS/TRAYS/PACK) ×2 IMPLANT
TUBE ANAEROBIC SPECIMEN COL (MISCELLANEOUS) ×3 IMPLANT
WATER STERILE IRR 1000ML POUR (IV SOLUTION) ×5 IMPLANT

## 2016-04-24 NOTE — Interval H&P Note (Signed)
History and Physical Interval Note:  04/24/2016 12:51 PM  Jonathan Harvey  has presented today for surgery, with the diagnosis of LEFT HIP POLY WEAR  The various methods of treatment have been discussed with the patient and family. After consideration of risks, benefits and other options for treatment, the patient has consented to  Procedure(s): TOTAL HIP REVISION (Left) as a surgical intervention .  The patient's history has been reviewed, patient examined, no change in status, stable for surgery.  I have reviewed the patient's chart and labs.  Questions were answered to the patient's satisfaction.     Kerin Salen

## 2016-04-24 NOTE — Op Note (Signed)
Preop diagnosis: Worn polyethylene liner with fracture of locking ring 71 year old left total hip   Postoperative diagnosis: Same  Procedure: Revision: Revision right total hip arthroplasty with removal of the 6 mm 10 fractured polyethylene liner and locking ring pieces Roman intact Duraloc shell we also removed the 28 mm 6 femoral head and revise to a new +4, 10 index posterior superior polyethylene liner with a new locking ring. On the femoral side we revised the femoral head component to a +936 mm femoral head.  Surgeon: Kathalene Frames. Mayer Camel M.D.  Assistant: Kerry Hough. Barton Dubois  (present throughout entire procedure and necessary for timely completion of the procedure)  Estimated blood loss: 400 cc  Fluid replacement: 1500 cc of crystalloid  Complications: None  Specimens: none  Indications: Patient had uncemented right total up arthroplasty by me 17 years ago and did well until the last couple of years when he was noted to have superior polyethylene wear in the last 6 months pain is gotten much worse x-ray show that the locking ring may be breaking and he is taken for revision of the polyethylene liner locking ring femoral head and any other components and may be damaged or loose. He is having significant pain and grinding and catching. Risks and benefits of surgery been discussed and all questions answered.    Procedure: Patient was identified by arm band receive preoperative IV antibiotics in the holding area at, [facility] hospital. Taken to the operating room where the appropriate anesthetic monitors were attached and general endotracheal anesthesia induced with the patient in the supine position. Rolled into the L lateral decubitus position and fixed there with a mark 2 pelvic clamp. The limb prepped and draped in usual sterile fashion from the ankle to the hemipelvis. Time out procedure performed. Skin along the lateral hip and thigh infiltrated with 10 cc of a mixture of 20 mL X roll and 30 mL  1/2% Marcaine and epinephrine solution. We began the operation by recreating the old posterior lateral incision 15 cm in line through the skin and subcutaneous tissue down to the level of the IT band which was cut in line with the skin incision. This exposed a gap between the greater trochanter and a trochanteric flare consistent with a trochanteric fracture he sustained a years ago but did well with nonoperative treatment. There is some metal staining of the tissues that was removed with electrocautery. We then took down the junction of the posterior pseudocapsule and greater trochanter encountering serosanguineous synovial fluid. The fluid was sent for Gram stain and culture although did not appear infected. We then took out posterior inferior and superior pseudocapsule which also calcifications and ossifications. We then dislocated a total hip and removed the femoral head with a mallet and metal cylinder.  We continued to remove scar tissue from around the acetabular component. The trunnion was tucked superiorly and anteriorly to the acetabular component, and the polyethylene liner was removed using the extraction tool. The O liner was noted to be fractured superiorly and the locking ring was fractured and pieces of it were found in the synovium and out along the greater trochanter. We then carefully cleared around the edges of the acetabular component in preparation for the a new liner and make sure that the locking mechanism was intact. At this point we tested the acetabular component to make sure was not loose it was not and a new 56 mm Duraloc polyethylene liner +4 mm, 10 liner index posterior superior was hammered into  place with a new locking ring and settled down firmly. Because the patient was short going into the procedure we selected a +9 mm 36 mm metal head to lengthen him a total of about 6 mm compared to where he was preoperatively. Stability was noted to 100 of flexion with 60 of internal  rotation.  The wound is irrigated out normal saline solution. The trochanteric fracture was reduced with a limb and abduction and #1 running Vicryl sutures were used to hold the reduction we anticipated will stretch out since this fractures a years old on the bone was relatively thin. The wound is thoroughly irrigated out with pulse lavage at this point and the rest of the expiratory limb Marcaine solution were injected into the soft tissues for anesthesia. We then closed the IT band with running #1 Vicryl suture, the subcutaneous tissue with 0 and 2-0 undyed Vicryl suture, the skin was closed with running interlocking 3-0 nylon suture. A dressing of Aquacel was then applied, the patient was unclamped a rolled supine awakened extubated and taken to the recovery without difficulty.

## 2016-04-24 NOTE — Discharge Instructions (Signed)

## 2016-04-24 NOTE — Transfer of Care (Signed)
Immediate Anesthesia Transfer of Care Note  Patient: Jonathan Harvey  Procedure(s) Performed: Procedure(s): TOTAL HIP REVISION (Left)  Patient Location: PACU  Anesthesia Type:General  Level of Consciousness: awake, alert  and oriented  Airway & Oxygen Therapy: Patient Spontanous Breathing and Patient connected to nasal cannula oxygen  Post-op Assessment: Report given to RN and Post -op Vital signs reviewed and stable  Post vital signs: Reviewed and stable  Last Vitals:  Vitals:   04/24/16 1111  BP: (!) 151/65  Pulse: 88  Resp: 18  Temp: 36.7 C    Last Pain:  Vitals:   04/24/16 1111  TempSrc: Oral      Patients Stated Pain Goal: 1 ([id number])  Complications: No apparent anesthesia complications

## 2016-04-24 NOTE — Anesthesia Postprocedure Evaluation (Signed)
Anesthesia Post Note  Patient: MATTTHEW KAWECKI  Procedure(s) Performed: Procedure(s) (LRB): TOTAL HIP REVISION (Left)  Patient location during evaluation: PACU Anesthesia Type: General Level of consciousness: awake and alert Pain management: pain level controlled Vital Signs Assessment: post-procedure vital signs reviewed and stable Respiratory status: spontaneous breathing, nonlabored ventilation, respiratory function stable and patient connected to nasal cannula oxygen Cardiovascular status: blood pressure returned to baseline and stable Postop Assessment: no signs of nausea or vomiting Anesthetic complications: no       Last Vitals:  Vitals:   04/24/16 1750 04/24/16 1956  BP: (!) 114/52 (!) 115/52  Pulse: 74 80  Resp: 14 14  Temp: 36.4 C 36.4 C    Last Pain:  Vitals:   04/24/16 1956  TempSrc: Oral  PainSc:                  Maeleigh Buschman,W. EDMOND

## 2016-04-24 NOTE — Interval H&P Note (Signed)
History and Physical Interval Note:  04/24/2016 12:54 PM  Jonathan Harvey  has presented today for surgery, with the diagnosis of LEFT HIP POLY WEAR  The various methods of treatment have been discussed with the patient and family. After consideration of risks, benefits and other options for treatment, the patient has consented to  Procedure(s): TOTAL HIP REVISION (Left) as a surgical intervention .  The patient's history has been reviewed, patient examined, no change in status, stable for surgery.  I have reviewed the patient's chart and labs.  Questions were answered to the patient's satisfaction.     Kerin Salen

## 2016-04-24 NOTE — Anesthesia Procedure Notes (Signed)
Procedure Name: Intubation Date/Time: 04/24/2016 1:46 PM Performed by: Julieta Bellini Pre-anesthesia Checklist: Patient identified, Emergency Drugs available, Suction available and Patient being monitored Patient Re-evaluated:Patient Re-evaluated prior to inductionOxygen Delivery Method: Circle system utilized Preoxygenation: Pre-oxygenation with 100% oxygen Intubation Type: IV induction Ventilation: Mask ventilation without difficulty Laryngoscope Size: Mac and 3 Grade View: Grade I Tube type: Oral Tube size: 7.5 mm Number of attempts: 1 Airway Equipment and Method: Stylet Placement Confirmation: ETT inserted through vocal cords under direct vision,  positive ETCO2 and breath sounds checked- equal and bilateral Secured at: 23 cm Tube secured with: Tape Dental Injury: Teeth and Oropharynx as per pre-operative assessment

## 2016-04-24 NOTE — Anesthesia Preprocedure Evaluation (Addendum)
Anesthesia Evaluation  Patient identified by MRN, date of birth, ID band Patient awake    Reviewed: Allergy & Precautions, NPO status   Airway Mallampati: II  TM Distance: >3 FB     Dental   Pulmonary neg pulmonary ROS,    breath sounds clear to auscultation       Cardiovascular hypertension,  Rhythm:Regular Rate:Normal     Neuro/Psych    GI/Hepatic negative GI ROS, (+) Hepatitis -  Endo/Other  negative endocrine ROS  Renal/GU negative Renal ROS     Musculoskeletal   Abdominal   Peds  Hematology  (+) Blood dyscrasia, ,   Anesthesia Other Findings   Reproductive/Obstetrics                             Anesthesia Physical Anesthesia Plan  ASA: III  Anesthesia Plan: General   Post-op Pain Management:    Induction: Intravenous  Airway Management Planned: Oral ETT  Additional Equipment:   Intra-op Plan:   Post-operative Plan: Possible Post-op intubation/ventilation  Informed Consent: I have reviewed the patients History and Physical, chart, labs and discussed the procedure including the risks, benefits and alternatives for the proposed anesthesia with the patient or authorized representative who has indicated his/her understanding and acceptance.   Dental advisory given  Plan Discussed with: Anesthesiologist and CRNA  Anesthesia Plan Comments:         Anesthesia Quick Evaluation

## 2016-04-25 ENCOUNTER — Encounter (HOSPITAL_COMMUNITY): Payer: Self-pay | Admitting: Orthopedic Surgery

## 2016-04-25 LAB — CBC
HEMATOCRIT: 33.1 % — AB (ref 39.0–52.0)
Hemoglobin: 11.1 g/dL — ABNORMAL LOW (ref 13.0–17.0)
MCH: 30.8 pg (ref 26.0–34.0)
MCHC: 33.5 g/dL (ref 30.0–36.0)
MCV: 91.9 fL (ref 78.0–100.0)
PLATELETS: 260 10*3/uL (ref 150–400)
RBC: 3.6 MIL/uL — ABNORMAL LOW (ref 4.22–5.81)
RDW: 14 % (ref 11.5–15.5)
WBC: 9 10*3/uL (ref 4.0–10.5)

## 2016-04-25 LAB — BASIC METABOLIC PANEL
ANION GAP: 9 (ref 5–15)
BUN: 17 mg/dL (ref 6–20)
CALCIUM: 8.9 mg/dL (ref 8.9–10.3)
CO2: 30 mmol/L (ref 22–32)
CREATININE: 1.2 mg/dL (ref 0.61–1.24)
Chloride: 102 mmol/L (ref 101–111)
Glucose, Bld: 120 mg/dL — ABNORMAL HIGH (ref 65–99)
Potassium: 3.6 mmol/L (ref 3.5–5.1)
Sodium: 141 mmol/L (ref 135–145)

## 2016-04-25 NOTE — Care Management Note (Signed)
Case Management Note  Patient Details  Name: Jonathan Harvey MRN: [id number] Date of Birth: 23-Nov-1945  Subjective/Objective:  71 y.o. S/p revision THA. Ordered 3n1 from [facility] rep.                   Action/Plan: CM will follow.    Expected Discharge Date:                  Expected Discharge Plan:     [name]:     Discharge planning Services  CM Consult  Post Acute Care Choice:  Durable Medical Equipment Choice offered to:     DME Arranged:  3-N-1 DME Agency:  [place]:    [place] [facility]:     Status of Service:  In process, will continue to follow  If discussed at Long Length of Stay Meetings, dates discussed:    Additional Comments:  Delrae Sawyers, RN 04/25/2016, 4:34 PM

## 2016-04-25 NOTE — Progress Notes (Signed)
PATIENT ID: Jonathan Harvey  MRN: [id number]  DOB/AGE:  1945-04-13 / 71 y.o.  1 Day Post-Op Procedure(s) (LRB): TOTAL HIP REVISION (Left)    PROGRESS NOTE Subjective: Patient is alert, oriented, no Nausea, no Vomiting, yes passing gas, . Taking PO well. Denies SOB, Chest or Calf Pain. Using Incentive Spirometer, PAS in place. Ambulate WBAT with pt standing at beside last night. Patient reports pain as  4/10  .    Objective: Vital signs in last 24 hours: Vitals:   04/24/16 1750 04/24/16 1956 04/25/16 0002 04/25/16 0600  BP: (!) 114/52 (!) 115/52 116/68 (!) 112/59  Pulse: 74 80 75 78  Resp: 14 14 14 16   Temp: 97.6 F (36.4 C) 97.5 F (36.4 C) 97.5 F (36.4 C) 98.3 F (36.8 C)  TempSrc: Oral Oral Oral Oral  SpO2: 100% 99% 99% 96%  Weight:      Height:          Intake/Output from previous day: I/O last 3 completed shifts: In: 1440 [P.O.:240; I.V.:1200] Out: 1375 [Urine:975; Blood:400]   Intake/Output this shift: No intake/output data recorded.   LABORATORY DATA:  Recent Labs  04/25/16 0627  WBC 9.0  HGB 11.1*  HCT 33.1*  PLT 260  NA 141  K 3.6  CL 102  CO2 30  BUN 17  CREATININE 1.20  GLUCOSE 120*  CALCIUM 8.9    Examination: Neurologically intact Neurovascular intact Sensation intact distally Intact pulses distally Dorsiflexion/Plantar flexion intact Incision: dressing C/D/I and no drainage No cellulitis present Compartment soft} XR AP&Lat of hip shows well placed\fixed THA  Assessment:   1 Day Post-Op Procedure(s) (LRB): TOTAL HIP REVISION (Left) ADDITIONAL DIAGNOSIS:  Expected Acute Blood Loss Anemia, Hypertension and kidney stones, hepatitis, and blood dyscrasia  Plan: PT/OT WBAT, THA  DVT Prophylaxis: SCDx72 hrs, ASA 325 mg BID x 2 weeks  DISCHARGE PLAN: Home, when pt passes therapy goals  DISCHARGE NEEDS: HHPT, Walker and 3-in-1 comode seat

## 2016-04-25 NOTE — Evaluation (Signed)
Physical Therapy Evaluation Patient Details Name: Jonathan Harvey MRN: [id number] DOB: 07/21/1945 Today's Date: 04/25/2016   History of Present Illness  Pt is a 71 y.o. male s/p L posterior total hip revision. PMHx: Hepatitis B, HTN.   Clinical Impression  Pt is POD 1 from the above procedure and moving well with therapy. Prior to admission, pt was independent and performed a few short sessions of therapy in order to prepare for his right knee replacement. Pt will  Benefit from continued skilled PT services to address the below deficits prior to discharge.     Follow Up Recommendations Home health PT;Supervision for mobility/OOB    Equipment Recommendations  3in1 (PT)    Recommendations for Other Services       Precautions / Restrictions Precautions Precautions: Fall;Posterior Hip Precaution Booklet Issued: Yes (comment) Precaution Comments: Pt is able to recall all precautions from OT eval Restrictions Weight Bearing Restrictions: Yes LLE Weight Bearing: Weight bearing as tolerated      Mobility  Bed Mobility               General bed mobility comments: OOB in recliner when PT arrives  Transfers Overall transfer level: Needs assistance Equipment used: Rolling walker (2 wheeled) Transfers: Sit to/from Stand Sit to Stand: Supervision         General transfer comment: Cues for hand placement.  Ambulation/Gait Ambulation/Gait assistance: Supervision Ambulation Distance (Feet): 500 Feet Assistive device: Rolling walker (2 wheeled) Gait Pattern/deviations: Step-through pattern Gait velocity: decreased Gait velocity interpretation: Below normal speed for age/gender General Gait Details: Mild antalgic gait with good step through sequencing  Stairs            Wheelchair Mobility    Modified Rankin (Stroke Patients Only)       Balance Overall balance assessment: Needs assistance Sitting-balance support: Feet supported;No upper extremity  supported Sitting balance-Leahy Scale: Good     Standing balance support: No upper extremity supported;During functional activity Standing balance-Leahy Scale: Good                               Pertinent Vitals/Pain Pain Assessment: 0-10 Pain Score: 3  Pain Location: L hip Pain Descriptors / Indicators: Sore;Dull Pain Intervention(s): Monitored during session;Limited activity within patient's tolerance;Premedicated before session    Home Living Family/patient expects to be discharged to:: Private residence Living Arrangements: Spouse/significant other Available Help at Discharge: Family;Available 24 hours/day Type of Home: House Home Access: Stairs to enter   [employer] of Steps: 2 Home Layout: One level Home Equipment: Walker - 2 wheels      Prior Function Level of Independence: Independent               Hand Dominance   Dominant Hand: Right    Extremity/Trunk Assessment   Upper Extremity Assessment Upper Extremity Assessment: Defer to OT evaluation    Lower Extremity Assessment Lower Extremity Assessment: LLE deficits/detail LLE Deficits / Details: pt presents with at least 3/5 grossly to LLE. Unable to fully assess due to pain LLE: Unable to fully assess due to pain    Cervical / Trunk Assessment Cervical / Trunk Assessment: Normal  Communication   Communication: HOH  Cognition Arousal/Alertness: Awake/alert Behavior During Therapy: WFL for tasks assessed/performed Overall Cognitive Status: Within Functional Limits for tasks assessed                      General Comments  Exercises Total Joint Exercises Ankle Circles/Pumps: AROM;Both;20 reps;Supine Quad Sets: AROM;Left;10 reps;Supine Heel Slides: AROM;Left;10 reps;Supine   Assessment/Plan    PT Assessment Patient needs continued PT services  PT Problem List Decreased strength;Decreased activity tolerance;Decreased balance;Decreased mobility       PT  Treatment Interventions DME instruction;Gait training;Stair training;Functional mobility training;Therapeutic activities;Therapeutic exercise;Balance training;Patient/family education    PT Goals (Current goals can be found in the Care Plan section)  Acute Rehab PT Goals Patient Stated Goal: return home PT Goal Formulation: With patient Time For Goal Achievement: 05/02/16 Potential to Achieve Goals: Good    Frequency 7X/week   Barriers to discharge        Co-evaluation               End of Session Equipment Utilized During Treatment: Gait belt Activity Tolerance: Patient tolerated treatment well Patient left: in chair;with call bell/phone within reach Nurse Communication: Mobility status PT Visit Diagnosis: Difficulty in walking, not elsewhere classified (R26.2);Pain Pain - Right/Left: Left Pain - part of body: Hip         Time: [id number] PT Time Calculation (min) (ACUTE ONLY): 20 min   Charges:   PT Evaluation $PT Eval Moderate Complexity: 1 Procedure     PT G Codes:         Scheryl Marten PT, DPT  [phone]  04/25/2016, 2:02 PM

## 2016-04-25 NOTE — Evaluation (Signed)
Occupational Therapy Evaluation and Discharge  Patient Details Name: Jonathan Harvey MRN: [id number] DOB: 29-Jan-1946 Today's Date: 04/25/2016    History of Present Illness Pt is a 71 y.o. male s/p L posterior total hip revision. PMHx: Hepatitis B, HTN.    Clinical Impression   Pt reports he was independent with ADL PTA. Currently pt overall supervision for ADL and functional mobility with the exception of min assist for LB ADL due to posterior hip precautions. Educated pt on maintaining posterior hip precautions during functional activities; pt able to recall 3/3 during session. Pt planning to d/c home with 24/7 supervision from his wife. No further acute OT needs identified; signing off at this time. Please re-consult if needs change. Thank you for this referral.    Follow Up Recommendations  No OT follow up;Supervision - Intermittent    Equipment Recommendations  3 in 1 bedside commode    Recommendations for Other Services PT consult     Precautions / Restrictions Precautions Precautions: Fall;Posterior Hip Precaution Booklet Issued: No Precaution Comments: Educated pt on posterior hip precautions, able to recall 3/3 at end of session.  Restrictions Weight Bearing Restrictions: Yes LLE Weight Bearing: Weight bearing as tolerated      Mobility Bed Mobility Overal bed mobility: Needs Assistance Bed Mobility: Sit to Supine       Sit to supine: Supervision;HOB elevated   General bed mobility comments: Increased time, no physical assist. HOB elevated with use of bed rails.  Transfers Overall transfer level: Needs assistance Equipment used: Rolling walker (2 wheeled) Transfers: Sit to/from Stand Sit to Stand: Supervision         General transfer comment: Cues for hand placement.    Balance Overall balance assessment: Needs assistance Sitting-balance support: Feet supported;No upper extremity supported Sitting balance-Leahy Scale: Good     Standing balance support:  No upper extremity supported;During functional activity Standing balance-Leahy Scale: Good                              ADL Overall ADL's : Needs assistance/impaired Eating/Feeding: Independent;Sitting   Grooming: Supervision/safety;Standing   Upper Body Bathing: Supervision/ safety;Sitting   Lower Body Bathing: Minimal assistance;Sit to/from stand   Upper Body Dressing : Supervision/safety;Sitting   Lower Body Dressing: Minimal assistance;Sit to/from stand Lower Body Dressing Details (indicate cue type and reason): Min assist due to posterior hip precautions. Pt reports wife to assist as needed. Toilet Transfer: Supervision/safety;Ambulation;BSC;RW Toilet Transfer Details (indicate cue type and reason): Simulated by sit to stand from EOB with functional mobility in room.     Tub/ Shower Transfer: Supervision/safety;Walk-in shower;Ambulation;Shower Technical sales engineer Details (indicate cue type and reason): Educated on walk in shower transfer technique with RW. Educated on use of shower chair for safety and supervision with transfers initially. Functional mobility during ADLs: Supervision/safety;Rolling walker       Vision Baseline Vision/History: Wears glasses Wears Glasses: At all times       Perception     Praxis      Pertinent Vitals/Pain Pain Assessment: Faces Faces Pain Scale: Hurts little more Pain Location: L hip Pain Descriptors / Indicators: Sore Pain Intervention(s): Monitored during session;Repositioned;Ice applied     Hand Dominance     Extremity/Trunk Assessment Upper Extremity Assessment Upper Extremity Assessment: Overall WFL for tasks assessed   Lower Extremity Assessment Lower Extremity Assessment: Defer to PT evaluation   Cervical / Trunk Assessment Cervical / Trunk Assessment: Normal  Communication Communication Communication: HOH (hearing aids)   Cognition Arousal/Alertness: Awake/alert Behavior During  Therapy: WFL for tasks assessed/performed Overall Cognitive Status: Within Functional Limits for tasks assessed                     General Comments       Exercises       Shoulder Instructions      Home Living Family/patient expects to be discharged to:: Private residence Living Arrangements: Spouse/significant other Available Help at Discharge: Family;Available 24 hours/day Type of Home: House Home Access: Stairs to enter [employer] of Steps: 2   Home Layout: One level     Bathroom Shower/Tub: Occupational psychologist: Handicapped height     Home Equipment: Environmental consultant - 2 wheels          Prior Functioning/Environment Level of Independence: Independent                 OT Problem List: Decreased strength;Impaired balance (sitting and/or standing);Decreased knowledge of use of DME or AE;Decreased knowledge of precautions;Pain      OT Treatment/Interventions:      OT Goals(Current goals can be found in the care plan section) Acute Rehab OT Goals Patient Stated Goal: return home OT Goal Formulation: All assessment and education complete, DC therapy  OT Frequency:     Barriers to D/C:            Co-evaluation              End of Session Equipment Utilized During Treatment: Rolling walker Nurse Communication: Mobility status;Precautions;Weight bearing status  Activity Tolerance: Patient tolerated treatment well Patient left: in chair;with call bell/phone within reach  OT Visit Diagnosis: Other abnormalities of gait and mobility (R26.89);Pain Pain - Right/Left: Left Pain - part of body: Hip                ADL either performed or assessed with clinical judgement  Time: 0813-0836 OT Time Calculation (min): 23 min Charges:  OT General Charges $OT Visit: 1 Procedure OT Evaluation $OT Eval Moderate Complexity: 1 Procedure OT Treatments $Self Care/Home Management : 8-22 mins G-Codes:     Mel Almond A. Ulice Brilliant, M.S.,  OTR/L Pager: [place] 04/25/2016, 8:50 AM

## 2016-04-25 NOTE — Progress Notes (Signed)
Physical Therapy Treatment Patient Details Name: Jonathan Harvey MRN: [id number] DOB: 1945/10/07 Today's Date: 04/25/2016    History of Present Illness Pt is a 71 y.o. male s/p L posterior total hip revision. PMHx: Hepatitis B, HTN.     PT Comments    Pt continues to be moving well with therapy. Performed gait x 500' and added in exercises in HEP. Answered all questions by wife who was present throughout this session. Pt will need to perform stair negotiation prior to discharge home.     Follow Up Recommendations  Home health PT;Supervision for mobility/OOB     Equipment Recommendations  3in1 (PT)    Recommendations for Other Services       Precautions / Restrictions Precautions Precautions: Fall;Posterior Hip Precaution Booklet Issued: Yes (comment) Precaution Comments: Pt is able to recall all precautions from OT eval Restrictions Weight Bearing Restrictions: Yes LLE Weight Bearing: Weight bearing as tolerated    Mobility  Bed Mobility               General bed mobility comments: OOB in recliner when PT arrives  Transfers Overall transfer level: Needs assistance Equipment used: Rolling walker (2 wheeled) Transfers: Sit to/from Stand Sit to Stand: Supervision         General transfer comment: Cues for hand placement.  Ambulation/Gait Ambulation/Gait assistance: Supervision Ambulation Distance (Feet): 500 Feet Assistive device: Rolling walker (2 wheeled) Gait Pattern/deviations: Step-through pattern Gait velocity: decreased Gait velocity interpretation: Below normal speed for age/gender General Gait Details: Mild antalgic gait with good step through sequencing   Stairs            Wheelchair Mobility    Modified Rankin (Stroke Patients Only)       Balance Overall balance assessment: Needs assistance Sitting-balance support: Feet supported;No upper extremity supported Sitting balance-Leahy Scale: Good     Standing balance support: No  upper extremity supported;During functional activity Standing balance-Leahy Scale: Good                      Cognition Arousal/Alertness: Awake/alert Behavior During Therapy: WFL for tasks assessed/performed Overall Cognitive Status: Within Functional Limits for tasks assessed                      Exercises Total Joint Exercises Ankle Circles/Pumps: AROM;Both;20 reps;Supine Quad Sets: AROM;Left;10 reps;Supine Short Arc Quad: AROM;Left;10 reps;Supine Heel Slides: AROM;Left;10 reps;Supine Hip ABduction/ADduction: AROM;Left;10 reps;Supine Long Arc Quad: PROM;Left;10 reps;Supine    General Comments        Pertinent Vitals/Pain Pain Assessment: 0-10 Pain Score: 3  Pain Location: L hip Pain Descriptors / Indicators: Sore;Dull Pain Intervention(s): Monitored during session;Limited activity within patient's tolerance;Repositioned;Ice applied    Home Living Family/patient expects to be discharged to:: Private residence Living Arrangements: Spouse/significant other Available Help at Discharge: Family;Available 24 hours/day Type of Home: House Home Access: Stairs to enter   Home Layout: One level Home Equipment: Environmental consultant - 2 wheels      Prior Function Level of Independence: Independent          PT Goals (current goals Jonathan now be found in the care plan section) Acute Rehab PT Goals Patient Stated Goal: return home PT Goal Formulation: With patient Time For Goal Achievement: 05/02/16 Potential to Achieve Goals: Good Progress towards PT goals: Progressing toward goals    Frequency    7X/week      PT Plan Current plan remains appropriate    Co-evaluation  End of Session Equipment Utilized During Treatment: Gait belt Activity Tolerance: Patient tolerated treatment well Patient left: in chair;with call bell/phone within reach Nurse Communication: Mobility status PT Visit Diagnosis: Difficulty in walking, not elsewhere classified  (R26.2);Pain Pain - Right/Left: Left Pain - part of body: Hip     Time: [id number] PT Time Calculation (min) (ACUTE ONLY): 22 min  Charges:  $Gait Training: 8-22 mins                    G Codes:       Scheryl Marten PT, DPT  [phone]  04/25/2016, 4:31 PM

## 2016-04-26 LAB — CBC
HCT: 29.4 % — ABNORMAL LOW (ref 39.0–52.0)
Hemoglobin: 9.8 g/dL — ABNORMAL LOW (ref 13.0–17.0)
MCH: 30.4 pg (ref 26.0–34.0)
MCHC: 33.3 g/dL (ref 30.0–36.0)
MCV: 91.3 fL (ref 78.0–100.0)
PLATELETS: 235 10*3/uL (ref 150–400)
RBC: 3.22 MIL/uL — AB (ref 4.22–5.81)
RDW: 13.9 % (ref 11.5–15.5)
WBC: 8.8 10*3/uL (ref 4.0–10.5)

## 2016-04-26 NOTE — Discharge Summary (Signed)
Patient ID: Jonathan Harvey MRN: [id number] DOB/AGE: Jul 06, 1945 71 y.o.  Admit date: 04/24/2016 Discharge date: 04/26/2016  Admission Diagnoses:  Active Problems:   Status post revision of total hip   Discharge Diagnoses:  Same  Past Medical History:  Diagnosis Date  . Arthritis   . Blood dyscrasia    prostectomy    . Hepatitis    Hepatitis B  2008  . History of kidney stones   . Hypertension     Surgeries: Procedure(s): TOTAL HIP REVISION on 04/24/2016   Consultants:   Discharged Condition: Improved  Hospital Course: VAUN SHELDON is an 71 y.o. male who was admitted 04/24/2016 for operative treatment of<principal problem not specified>. Patient has severe unremitting pain that affects sleep, daily activities, and work/hobbies. After pre-op clearance the patient was taken to the operating room on 04/24/2016 and underwent  Procedure(s): TOTAL HIP REVISION.    Patient was given perioperative antibiotics: Anti-infectives    Start     Dose/Rate Route Frequency Ordered Stop   04/24/16 1230  ceFAZolin (ANCEF) IVPB 2g/100 mL premix     2 g 200 mL/hr over 30 Minutes Intravenous To [facility] 04/23/16 0810 04/24/16 1351       Patient was given sequential compression devices, early ambulation, and chemoprophylaxis to prevent DVT.  Patient benefited maximally from hospital stay and there were no complications.    Recent vital signs: Patient Vitals for the past 24 hrs:  BP Temp Temp src Pulse Resp SpO2  04/26/16 0410 134/63 97.8 F (36.6 C) Oral 76 18 99 %  04/25/16 2235 (!) 131/49 98.3 F (36.8 C) Oral 91 16 98 %  04/25/16 1600 118/60 98.6 F (37 C) Oral 78 - 98 %     Recent laboratory studies:  Recent Labs  04/25/16 0627 04/26/16 0317  WBC 9.0 8.8  HGB 11.1* 9.8*  HCT 33.1* 29.4*  PLT 260 235  NA 141  --   K 3.6  --   CL 102  --   CO2 30  --   BUN 17  --   CREATININE 1.20  --   GLUCOSE 120*  --   CALCIUM 8.9  --      Discharge Medications:    Allergies as of 04/26/2016      Reactions   Lactose Intolerance (gi) Other (See Comments)   Mild GI upset      Medication List    STOP taking these medications   meloxicam 15 MG tablet Commonly known as:  MOBIC     TAKE these medications   aspirin EC 325 MG tablet Take 1 tablet (325 mg total) by mouth 2 (two) times daily. What changed:  medication strength  how much to take  when to take this   cetirizine-pseudoephedrine 5-120 MG tablet Commonly known as:  ZYRTEC-D Take 1 tablet by mouth at bedtime.   furosemide 40 MG tablet Commonly known as:  LASIX Take 20 mg by mouth daily.   gabapentin 300 MG capsule Commonly known as:  NEURONTIN Take 300 mg by mouth 3 (three) times daily. Morning, Lunch, & Bedtime.   hydrocortisone 2.5 % rectal cream Commonly known as:  ANUSOL-HC Place 1 application rectally daily. Applied after bathing due to hemorrhoids   losartan-hydrochlorothiazide 100-25 MG tablet Commonly known as:  HYZAAR Take 1 tablet by mouth daily.   metoprolol succinate 25 MG 24 hr tablet Commonly known as:  TOPROL-XL Take 25 mg by mouth daily.   multivitamin with minerals Tabs tablet Take  1 tablet by mouth daily.   oxyCODONE-acetaminophen 5-325 MG tablet Commonly known as:  ROXICET Take 1 tablet by mouth every 4 (four) hours as needed.   simvastatin 40 MG tablet Commonly known as:  ZOCOR Take 20 mg by mouth at bedtime.   tiZANidine 2 MG tablet Commonly known as:  ZANAFLEX Take 1 tablet (2 mg total) by mouth every 6 (six) hours as needed for muscle spasms.   traMADol 50 MG tablet Commonly known as:  ULTRAM Take 100 mg by mouth 3 (three) times daily as needed (for pain). 100 mg in the morning & 100 mg at lunch time (may take an additional dose at dinner)   Vitamin B-12 500 MCG Subl Place 5,000 mcg under the tongue daily.   Vitamin D 2000 units tablet Take 2,000 Units by mouth 2 (two) times daily.            Durable Medical Equipment         Start     Ordered   04/25/16 1616  For home use only DME 3 n 1  Once     04/25/16 1616   04/24/16 1734  DME Walker rolling  Once    Question:  Patient needs a walker to treat with the following condition  Answer:  Status post revision of total hip   04/24/16 1733   04/24/16 1734  DME 3 n 1  Once     04/24/16 1733   04/24/16 1734  DME Bedside commode  Once    Question:  Patient needs a bedside commode to treat with the following condition  Answer:  Status post revision of total hip   04/24/16 1733      Diagnostic Studies: Dg Chest 2 View  Result Date: 04/24/2016 CLINICAL DATA:  71 year old male with a history of preoperative x-rays for hip surgery EXAM: CHEST  2 VIEW COMPARISON:  None. FINDINGS: Cardiomediastinal silhouette within normal limits. Asymmetric elevation of the right hemidiaphragm. No comparison available. No pneumothorax or pleural effusion.  No confluent airspace disease. IMPRESSION: No radiographic evidence of acute cardiopulmonary disease. Asymmetric elevation the right hemidiaphragm, uncertain etiology and chronicity, with no comparison available. Signed, Dulcy Fanny. Earleen Newport, DO [facility] Radiology Electronically Signed   By: Corrie Mckusick D.O.   On: 04/24/2016 11:33   Dg Pelvis Portable  Result Date: 04/24/2016 CLINICAL DATA:  71 year old male with a history of total hip arthroplasty EXAM: PORTABLE PELVIS 1-2 VIEWS COMPARISON:  09/19/2015, 07/01/2012 FINDINGS: Re- demonstration of incomplete healing of superior and inferior left pubic ramus. These fractures were present on the study 09/19/2015, and not present 07/01/2012. Re- demonstration of surgical changes of left hip arthroplasty. When compared across modalities, degree lucency at the proximal femur is similar to that on the comparison CT study. Alignment of the acetabular component relatively unchanged compared to the tomogram. Unremarkable appearance of the proximal right  femur. Degenerative changes of the right hip. Degenerative changes of the spine. IMPRESSION: No new acute fracture identified. 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 These fractures were present on CT 09/19/2015, and not on the CT 07/01/2012. Re- demonstration of left hip arthroplasty, with proximal femoral lucency adjacent to the femoral stem, described on prior CT 09/19/2015. Appearance is relatively unchanged from this CT. Degenerative changes of the right hip. Electronically Signed   By: Corrie Mckusick D.O.   On: 04/24/2016 17:14    Disposition:   Discharge Instructions  Call MD / Call 911    Complete by:  As directed    If you experience chest pain or shortness of breath, CALL 911 and be transported to the hospital emergency room.  If you develope a fever above 101 F, pus (white drainage) or increased drainage or redness at the wound, or calf pain, call your surgeon's office.   Constipation Prevention    Complete by:  As directed    Drink plenty of fluids.  Prune juice may be helpful.  You may use a stool softener, such as Colace (over the counter) 100 mg twice a day.  Use MiraLax (over the counter) for constipation as needed.   Diet - low sodium heart healthy    Complete by:  As directed    Driving restrictions    Complete by:  As directed    No driving for 2 weeks   Follow the hip precautions as taught in Physical Therapy    Complete by:  As directed    Increase activity slowly as tolerated    Complete by:  As directed    Patient may shower    Complete by:  As directed    You may shower without a dressing once there is no drainage.  Do not wash over the wound.  If drainage remains, cover wound with plastic wrap and then shower.      Follow-up Information    Kerin Salen, MD Follow up in 2 week(s).   Specialty:  Orthopedic Surgery Contact information: [place] [address] [place] Follow up.   Why:  3n1 will be provided by the above agency.  Contact information: [address] [place] [address] [phone]            Signed: Hardin Negus Rokia Bosket R 04/26/2016, 10:57 AM

## 2016-04-26 NOTE — Care Management (Addendum)
[facility] confirmed that they will be able to rpovide Home health PT for patient. Start of care will be either 04/29/16 or 04/30/16. CM called Sandi Raveling, Dr. Damita Dunnings scheduler and informed her. Also called patient's home and left a voice message stating they will receive a call from  [facility] agency. Case manager faxed orders, demographics, OP note to [facility].

## 2016-04-26 NOTE — Progress Notes (Signed)
PATIENT ID: Jonathan Harvey  MRN: [id number]  DOB/AGE:  69-18-1947 / 70 y.o.  2 Days Post-Op Procedure(s) (LRB): TOTAL HIP REVISION (Left)    PROGRESS NOTE Subjective: Patient is alert, oriented, no Nausea, no Vomiting, yes passing gas, . Taking PO well. Denies SOB, Chest or Calf Pain. Using Incentive Spirometer, PAS in place. Ambulate WBAT with pt walking 500 ft Patient reports pain as  4/10  .    Objective: Vital signs in last 24 hours: Vitals:   04/25/16 0900 04/25/16 1600 04/25/16 2235 04/26/16 0410  BP: (!) 116/55 118/60 (!) 131/49 134/63  Pulse: 85 78 91 76  Resp:   16 18  Temp:  98.6 F (37 C) 98.3 F (36.8 C) 97.8 F (36.6 C)  TempSrc:  Oral Oral Oral  SpO2:  98% 98% 99%  Weight:      Height:          Intake/Output from previous day: I/O last 3 completed shifts: In: 720 [P.O.:720] Out: 975 [Urine:975]   Intake/Output this shift: Total I/O In: 240 [P.O.:240] Out: -    LABORATORY DATA:  Recent Labs  04/25/16 0627 04/26/16 0317  WBC 9.0 8.8  HGB 11.1* 9.8*  HCT 33.1* 29.4*  PLT 260 235  NA 141  --   K 3.6  --   CL 102  --   CO2 30  --   BUN 17  --   CREATININE 1.20  --   GLUCOSE 120*  --   CALCIUM 8.9  --     Examination: Neurologically intact Neurovascular intact Sensation intact distally Intact pulses distally Dorsiflexion/Plantar flexion intact Incision: dressing C/D/I No cellulitis present Compartment soft} XR AP&Lat of hip shows well placed\fixed THA  Assessment:   2 Days Post-Op Procedure(s) (LRB): TOTAL HIP REVISION (Left) ADDITIONAL DIAGNOSIS:  Expected Acute Blood Loss Anemia, Hypertension  Plan: PT/OT WBAT, THA  DVT Prophylaxis: SCDx72 hrs, ASA 325 mg BID x 2 weeks  DISCHARGE PLAN: Home  DISCHARGE NEEDS: HHPT, Walker and 3-in-1 comode seat

## 2016-04-26 NOTE — Progress Notes (Signed)
Physical Therapy Treatment Note:  Clinical Impression: Pt continues to be moving well with therapy. Performed standing LE strengthening exercises and gait with RW with improved cadence and sequencing. Pt continues to make steady progress toward goals and is ready for discharge once cleared by physician.   Scheryl Marten PT, DPT  [phone]   04/26/16 1126  PT Visit Information  Last PT Received On 04/26/16  Assistance Needed +1  History of Present Illness Pt is a 71 y.o. male s/p L posterior total hip revision. PMHx: Hepatitis B, HTN.   Subjective Data  Subjective pt states that he continues to be doing well   Patient Stated Goal return home  Precautions  Precautions Fall;Posterior Hip  Precaution Booklet Issued Yes (comment)  Precaution Comments Pt is able to recall all precautions   Restrictions  Weight Bearing Restrictions Yes  LLE Weight Bearing WBAT  Pain Assessment  Pain Assessment 0-10  Pain Score 4  Pain Location L hip  Pain Descriptors / Indicators Aching  Pain Intervention(s) Monitored during session  Cognition  Arousal/Alertness Awake/alert  Behavior During Therapy WFL for tasks assessed/performed  Overall Cognitive Status Within Functional Limits for tasks assessed  Bed Mobility  General bed mobility comments OOB in recliner when PT arrives  Transfers  Overall transfer level Needs assistance  Equipment used Rolling walker (2 wheeled)  Transfers Sit to/from Stand  Sit to Stand Supervision  General transfer comment Cues for hand placement.  Ambulation/Gait  Ambulation/Gait assistance Supervision  Ambulation Distance (Feet) 500 Feet  Assistive device Rolling walker (2 wheeled)  Gait Pattern/deviations Step-through pattern  General Gait Details Mild antalgic gait with good step through sequencing  Gait velocity reaching normal  Gait velocity interpretation at or above normal speed for age/gender  Exercises  Exercises Total Joint  Total Joint Exercises  Hip  ABduction/ADduction AROM;Left;10 reps;Standing  Knee Flexion AROM;10 reps;Standing;Left  Marching in Standing AROM;10 reps;Left;Standing  Standing Hip Extension AROM;Left;10 reps;Standing  PT - End of Session  Equipment Utilized During Treatment Gait belt  Activity Tolerance Patient tolerated treatment well  Patient left in chair;with call bell/phone within reach;with family/visitor present  Nurse Communication Mobility status  PT - Assessment/Plan  PT Plan Current plan remains appropriate  PT Visit Diagnosis Difficulty in walking, not elsewhere classified (R26.2);Pain  Pain - Right/Left Left  Pain - part of body Hip  PT Frequency (ACUTE ONLY) 7X/week  Follow Up Recommendations Home health PT;Supervision for mobility/OOB  PT equipment 3in1 (PT)  AM-PAC PT "6 Clicks" Daily Activity Outcome Measure  Difficulty turning over in bed (including adjusting bedclothes, sheets and blankets)? 4  Difficulty moving from lying on back to sitting on the side of the bed?  4  Difficulty sitting down on and standing up from a chair with arms (e.g., wheelchair, bedside commode, etc,.)? 4  Help needed moving to and from a bed to chair (including a wheelchair)? 4  Help needed walking in hospital room? 4  Help needed climbing 3-5 steps with a railing?  4  6 Click Score 24  Mobility G Code  CH  PT Goal Progression  Progress towards PT goals Progressing toward goals  PT Time Calculation  PT Start Time (ACUTE ONLY) 1100  PT Stop Time (ACUTE ONLY) 1124  PT Time Calculation (min) (ACUTE ONLY) 24 min  PT General Charges  $$ ACUTE PT VISIT 1 Procedure  PT Treatments  $Gait Training 8-22 mins  $Therapeutic Exercise 8-22 mins

## 2016-04-26 NOTE — Progress Notes (Signed)
Physical Therapy Treatment Patient Details Name: Jonathan Harvey MRN: [id number] DOB: 1945-12-10 Today's Date: 04/26/2016    History of Present Illness Pt is a 71 y.o. male s/p L posterior total hip revision. PMHx: Hepatitis B, HTN.     PT Comments    Pt is POD 2 and continues to be moving very well with therapy. Performed gait x 600' this session and reviewed stair negotiation with 1 railing on right. Pt is more comfortable descending stairs with RLE due to increased pain and OA in right knee. Pt continues to make steady progress toward goals.     Follow Up Recommendations  Home health PT;Supervision for mobility/OOB     Equipment Recommendations  3in1 (PT)    Recommendations for Other Services       Precautions / Restrictions Precautions Precautions: Fall;Posterior Hip Precaution Booklet Issued: Yes (comment) Precaution Comments: Pt is able to recall all precautions  Restrictions Weight Bearing Restrictions: Yes LLE Weight Bearing: Weight bearing as tolerated    Mobility  Bed Mobility               General bed mobility comments: OOB in recliner when PT arrives  Transfers Overall transfer level: Needs assistance Equipment used: Rolling walker (2 wheeled) Transfers: Sit to/from Stand Sit to Stand: Supervision         General transfer comment: Cues for hand placement.  Ambulation/Gait Ambulation/Gait assistance: Supervision Ambulation Distance (Feet): 600 Feet Assistive device: Rolling walker (2 wheeled) Gait Pattern/deviations: Step-through pattern Gait velocity: decreased Gait velocity interpretation: Below normal speed for age/gender General Gait Details: Mild antalgic gait with good step through sequencing   Stairs Stairs: Yes   Stair Management: One rail Right;Step to pattern;Forwards Number of Stairs: 2 General stair comments: min cues for sequencing. Pt is able to perform step through sequencing ascending and is more comfortable stepping down  with RLE descending due to increased knee pain  Wheelchair Mobility    Modified Rankin (Stroke Patients Only)       Balance                                    Cognition Arousal/Alertness: Awake/alert Behavior During Therapy: WFL for tasks assessed/performed Overall Cognitive Status: Within Functional Limits for tasks assessed                      Exercises Total Joint Exercises Ankle Circles/Pumps: AROM;Both;20 reps;Supine Quad Sets: AROM;Left;10 reps;Supine Heel Slides: AROM;Left;10 reps;Supine    General Comments        Pertinent Vitals/Pain Pain Assessment: 0-10 Pain Score: 2  Pain Location: L hip Pain Descriptors / Indicators: Aching Pain Intervention(s): Monitored during session;Premedicated before session;Ice applied    Home Living                      Prior Function            PT Goals (current goals can now be found in the care plan section) Acute Rehab PT Goals Patient Stated Goal: return home Progress towards PT goals: Progressing toward goals    Frequency    7X/week      PT Plan Current plan remains appropriate    Co-evaluation             End of Session Equipment Utilized During Treatment: Gait belt Activity Tolerance: Patient tolerated treatment well Patient left: in chair;with call bell/phone within  reach Nurse Communication: Mobility status PT Visit Diagnosis: Difficulty in walking, not elsewhere classified (R26.2);Pain Pain - Right/Left: Left Pain - part of body: Hip     Time: [id number] PT Time Calculation (min) (ACUTE ONLY): 23 min  Charges:  $Gait Training: 23-37 mins                    G Codes:       Scheryl Marten PT, DPT  [phone]  04/26/2016, 10:13 AM

## 2016-04-26 NOTE — Care Management (Signed)
Case manager spoke with patient and his wife concerning [facility] agency. They are ready to discharge so Case manager will followup and contact them at Home. Patient states he thinks it is [facility]. CM will confirm.

## 2016-04-26 NOTE — Progress Notes (Signed)
Discharge instructions printed and reviewed with patient and wife, and copy given for them to take home. All questions addressed at this time. New prescriptions have been printed and signed, gave to patient to be filled after discharge. IV's removed. Room searched for patient belongings, and confirmed with patient that all valuables were accounted for. Wife assisted patient to dress. Awaiting case management to see patient after lunch as patient is in need of a raised toilet seat with handles before discharge. Will continue to monitor.

## 2016-04-29 LAB — AEROBIC/ANAEROBIC CULTURE (SURGICAL/DEEP WOUND): CULTURE: NO GROWTH

## 2016-04-29 LAB — AEROBIC/ANAEROBIC CULTURE W GRAM STAIN (SURGICAL/DEEP WOUND)

## 2016-05-13 ENCOUNTER — Ambulatory Visit: Payer: Medicare Other | Admitting: Physical Therapy

## 2016-05-16 ENCOUNTER — Encounter: Payer: Self-pay | Admitting: Physical Therapy

## 2016-05-16 ENCOUNTER — Ambulatory Visit: Payer: Medicare Other | Attending: Orthopedic Surgery | Admitting: Physical Therapy

## 2016-05-16 DIAGNOSIS — R262 Difficulty in walking, not elsewhere classified: Secondary | ICD-10-CM | POA: Diagnosis present

## 2016-05-16 DIAGNOSIS — M25561 Pain in right knee: Secondary | ICD-10-CM | POA: Insufficient documentation

## 2016-05-16 DIAGNOSIS — R2689 Other abnormalities of gait and mobility: Secondary | ICD-10-CM | POA: Insufficient documentation

## 2016-05-16 DIAGNOSIS — M25552 Pain in left hip: Secondary | ICD-10-CM | POA: Diagnosis present

## 2016-05-16 DIAGNOSIS — G8929 Other chronic pain: Secondary | ICD-10-CM | POA: Diagnosis present

## 2016-05-16 DIAGNOSIS — M25661 Stiffness of right knee, not elsewhere classified: Secondary | ICD-10-CM | POA: Insufficient documentation

## 2016-05-16 NOTE — Therapy (Signed)
[facility] [place], [place], [address] Phone: [phone]   Fax:  [phone]  Physical Therapy Evaluation  Patient Details  Name: Jonathan Harvey MRN: [id number] Date of Birth: February 02, 1946 Referring Provider: [name], MD  Encounter Date: 05/16/2016      PT End of Session - 05/16/16 0930    Visit Number 1   Number of Visits 16   Date for PT Re-Evaluation 07/16/16   Authorization Type KX modifier after 15 visit   PT Start Time 0905   PT Stop Time 0950   PT Time Calculation (min) 45 min   Activity Tolerance Patient tolerated treatment well   Behavior During Therapy [facility] for tasks assessed/performed      Past Medical History:  Diagnosis Date  . Arthritis   . Blood dyscrasia    prostectomy    . Hepatitis    Hepatitis B  2008  . History of kidney stones   . Hypertension     Past Surgical History:  Procedure Laterality Date  . BREAST LUMPECTOMY Left    lump remove from left arm  . CARPAL TUNNEL RELEASE Right 2010  . curved femur Left   . HAND SURGERY Right 1987   right thumb and index finger  . HERNIA REPAIR  2008   inguineal  . JOINT REPLACEMENT Left 2002   hip replacement  . MASTOIDECTOMY  1982  . radical prostectomy    . TOTAL HIP REVISION Left 04/24/2016   Procedure: TOTAL HIP REVISION;  Surgeon: [name], MD;  Location: [place];  Service: Orthopedics;  Laterality: Left;    There were no vitals filed for this visit.       Subjective Assessment - 05/16/16 0915    Subjective Patient arriving to therapy today s/p left total hip revision on 04/24/16. Pt reporting pain today of 4/10 today. Pt's initial left hip replacement was 15 years ago. Pt was coming to therapy beginning in January 2018 for his R knee pain for pre-op rehab. Patient reporting right knee pain of 5-6/10 with walking and standing today. Pt reporting R kneee pain varies and can reach an 8-9/10.    Pertinent History sciatica problems from  chronic back pain, pt taking gabapentin to help   Limitations Walking;House hold activities;Standing   How long can you sit comfortably? unlimited   How long can you stand comfortably? 10 minutes   How long can you walk comfortably? house hold distance using his straight cane   Patient Stated Goals Walk better   Currently in Pain? Yes   Pain Score 5    Pain Location Hip   Pain Orientation Left   Pain Descriptors / Indicators Aching;Sore   Pain Type Surgical pain   Pain Onset 1 to 4 weeks ago   Pain Frequency Constant   Aggravating Factors  walking, standing   Pain Relieving Factors sitting, resting,  meds   Effect of Pain on Daily Activities difficulty with household chores, uanble to perform yardwork   Multiple Pain Sites Yes   Pain Score 6   Pain Location Knee   Pain Orientation Right   Pain Descriptors / Indicators Aching   Pain Type Chronic pain   Pain Onset More than a month ago   Pain Frequency Constant   Aggravating Factors  walking, standing   Pain Relieving Factors sitting, recent knee injection   Effect of Pain on Daily Activities unable to perform chores  [facility] PT Assessment - 05/16/16 0001      Assessment   Medical Diagnosis s/p L total hip replacement revision   Referring Provider [name], MD   Onset Date/Surgical Date 04/24/16   Hand Dominance Right   Prior Therapy yes for left knee pre-op preparation,     Precautions   Precautions Posterior Hip     Restrictions   Weight Bearing Restrictions No     Balance Screen   Has the patient fallen in the past 6 months No     [place] residence   Living Arrangements Spouse/significant other   Available Help at Discharge Family     Prior Function   Level of Independence Independent   Leisure work out side     Cognition   Overall Cognitive Status Within Functional Limits for tasks assessed     Observation/Other Assessments   Observations Left shoe  lift, genu vlagus R LE, trendelenburg gait pattern,     Posture/Postural Control   Posture Comments R LE Leg Length Difference., R knee flexion contracture, R hip drop in standing, lateral R patella, forward head, forward shoulders     ROM / Strength   AROM / PROM / Strength Strength     AROM   Overall AROM Comments L knee WFL   AROM Assessment Site Knee   Right/Left Knee Right   Right Knee Extension -22   Right Knee Flexion 92     Strength   Strength Assessment Site Hip;Knee   Right/Left Hip Right;Left   Right Hip Flexion 5/5   Right Hip Extension 4-/5   Right Hip ABduction 4-/5   Right Hip ADduction 4-/5   Left Hip Flexion 3/5   Left Hip Extension 3/5   Left Hip ABduction 3/5   Left Hip ADduction 3-/5   Right/Left Knee Right;Left   Right Knee Flexion 4/5   Right Knee Extension 4/5   Left Knee Flexion 5/5   Left Knee Extension 5/5     Palpation   Palpation comment Tenderness around surgical incision, closed wound      Transfers   Five time sit to stand comments  19 seconds using UE support     Ambulation/Gait   Ambulation/Gait Yes   Ambulation/Gait Assistance 6: Modified independent (Device/Increase time)   Assistive device Straight cane   Gait Pattern Step-through pattern;Decreased stance time - right;Trendelenburg;Lateral trunk lean to right;Poor foot clearance - left;Poor foot clearance - right  R genu valgum, No termial knee extension or heel strike RLE     High Level Balance   High Level Balance Comments R SLS: 1 second, L SLS: 3 seconds with SBA for both                           PT Education - 05/16/16 0927    Education provided Yes   Education Details ICE, HEP, walking    Person(s) Educated Patient   Methods Explanation;Demonstration   Comprehension Verbalized understanding;Returned demonstration          PT Short Term Goals - 05/16/16 1511      PT SHORT TERM GOAL #1   Title Pt will be independent in his initial HEP.   Time  3   Period Weeks   Status New           PT Long Term Goals - 05/16/16 1508      PT LONG TERM GOAL #1  Title Independent with a HEP progression   Time 12   Period Weeks   Status New     PT LONG TERM GOAL #2   Title Pt will be able to ambulate 500 feet with straight cane with equalized step length with no pain in left hip.    Baseline pain with short distance amb using a straight cane   Time 12   Period Weeks   Status New     PT LONG TERM GOAL #3   Title Pt will be able to improve left LE strength to 5/5 in hip flexion, abduction, adduction, and extension in order to improve gait.    Time 12   Period Weeks   Status New     PT LONG TERM GOAL #4   Title Pt will improve SLS on the L LE to 10 seconds in order to improve safety with amb and gait   Time 12   Period Weeks   Status New               Plan - 05/16/16 1501    Clinical Impression Statement Pt arriving to therapy as a moderate complexity evalaution with surgical left hip pain following a total hip replacement revision on 04/24/16. Pt was receiving therapy prior for his R knee in preparation for a Total Knee Replacement. Pt amb into clinic with antalgic gait pattern, left shoe lift, R knee flexion contracture of  22 degrees, decreased heel strike on R LE, decreased stance time on the R due to increased R knee pain with a trendelenburg gait pattern,using a straight cane. Pt with bilateral LE strength deficits. Skilled PT needed to address the listed impairments with the below interventions.    Rehab Potential Good   Clinical Impairments Affecting Rehab Potential Right knee flexion contracture.   PT Frequency 2x / week   PT Duration 12 weeks   PT Treatment/Interventions ADLs/Self Care Home Management;Therapeutic activities;Therapeutic exercise;Neuromuscular re-education;Patient/family education;Manual techniques;Gait training;Stair training;Functional mobility training;Balance training;Taping;Dry  needling;Cryotherapy;Electrical Stimulation   PT Next Visit Plan Nustep, LE strengthening, Total hip protocol exercises, posterior precautions   PT Home Exercise Plan establish HEP next treatment   Consulted and Agree with Plan of Care Patient      Patient will benefit from skilled therapeutic intervention in order to improve the following deficits and impairments:  Abnormal gait, Decreased activity tolerance, Decreased range of motion, Decreased strength, Difficulty walking, Pain  Visit Diagnosis: Pain in left hip - Plan: PT plan of care cert/re-cert  Difficulty in walking, not elsewhere classified - Plan: PT plan of care cert/re-cert  Other abnormalities of gait and mobility - Plan: PT plan of care cert/re-cert  Stiffness of right knee, not elsewhere classified - Plan: PT plan of care cert/re-cert  Chronic pain of right knee - Plan: PT plan of care cert/re-cert      G-Codes - 81/01/75 1514    Functional Assessment Tool Used (Outpatient Only) clinical assessment   Functional Limitation Mobility: Walking and moving around   Mobility: Walking and Moving Around Current Status ([id number]) At least 40 percent but less than 60 percent impaired, limited or restricted   Mobility: Walking and Moving Around Goal Status [phone]) At least 40 percent but less than 60 percent impaired, limited or restricted       Problem List Patient Active Problem List   Diagnosis Date Noted  . 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

## 2016-05-22 ENCOUNTER — Ambulatory Visit: Payer: Medicare Other | Admitting: Physical Therapy

## 2016-05-22 ENCOUNTER — Encounter: Payer: Self-pay | Admitting: Physical Therapy

## 2016-05-22 DIAGNOSIS — M25552 Pain in left hip: Secondary | ICD-10-CM | POA: Diagnosis not present

## 2016-05-22 DIAGNOSIS — G8929 Other chronic pain: Secondary | ICD-10-CM

## 2016-05-22 DIAGNOSIS — R2689 Other abnormalities of gait and mobility: Secondary | ICD-10-CM

## 2016-05-22 DIAGNOSIS — M25661 Stiffness of right knee, not elsewhere classified: Secondary | ICD-10-CM

## 2016-05-22 DIAGNOSIS — M25561 Pain in right knee: Secondary | ICD-10-CM

## 2016-05-22 DIAGNOSIS — R262 Difficulty in walking, not elsewhere classified: Secondary | ICD-10-CM

## 2016-05-22 NOTE — Therapy (Signed)
[facility] [place], [place], [address] Phone: [phone]   Fax:  [phone]  Physical Therapy Treatment  Patient Details  Name: Jonathan Harvey MRN: [id number] Date of Birth: 01/24/1946 Referring Provider: [name], MD  Encounter Date: 05/22/2016      PT End of Session - 05/22/16 1313    Visit Number 2   Number of Visits 16   Date for PT Re-Evaluation 07/16/16   Authorization Type KX modifier after 15 visit   PT Start Time 8527   PT Stop Time 1345   PT Time Calculation (min) 40 min   Activity Tolerance Patient tolerated treatment well   Behavior During Therapy [facility] for tasks assessed/performed      Past Medical History:  Diagnosis Date  . Arthritis   . Blood dyscrasia    prostectomy    . Hepatitis    Hepatitis B  2008  . History of kidney stones   . Hypertension     Past Surgical History:  Procedure Laterality Date  . BREAST LUMPECTOMY Left    lump remove from left arm  . CARPAL TUNNEL RELEASE Right 2010  . curved femur Left   . HAND SURGERY Right 1987   right thumb and index finger  . HERNIA REPAIR  2008   inguineal  . JOINT REPLACEMENT Left 2002   hip replacement  . MASTOIDECTOMY  1982  . radical prostectomy    . TOTAL HIP REVISION Left 04/24/2016   Procedure: TOTAL HIP REVISION;  Surgeon: [name], MD;  Location: [place];  Service: Orthopedics;  Laterality: Left;    There were no vitals filed for this visit.      Subjective Assessment - 05/22/16 1312    Subjective Reports that he was able to put his shoes and socks on today and reports that he has been able to walk longer periods of time. Reports no reall L hip or R knee pain today only L knee toothache sensation.   Pertinent History sciatica problems from chronic back pain, pt taking gabapentin to help   Limitations Walking;House hold activities;Standing   How long can you sit comfortably? unlimited   How long can you stand comfortably? 10  minutes   How long can you walk comfortably? house hold distance using his straight cane   Patient Stated Goals Walk better   Currently in Pain? Yes   Pain Score 8    Pain Location Knee   Pain Orientation Left   Pain Descriptors / Indicators Aching   Pain Type Surgical pain   Pain Onset 1 to 4 weeks ago            [facility] PT Assessment - 05/22/16 0001      Assessment   Medical Diagnosis s/p L total hip replacement revision   Onset Date/Surgical Date 04/24/16   Hand Dominance Right   Next MD Visit 05/28/2016   Prior Therapy yes for left knee pre-op preparation,     Precautions   Precautions Posterior Hip     Restrictions   Weight Bearing Restrictions No                     [facility] Adult PT Treatment/Exercise - 05/22/16 0001      Knee/Hip Exercises: Aerobic   Nustep L3 x10 min     Knee/Hip Exercises: Standing   Hip Abduction AROM;Left;2 sets;10 reps;Knee straight   Hip Extension AROM;Left;2 sets;10 reps;Knee straight   Lateral Step Up Right;2  sets;10 reps;Hand Hold: 2;Step Height: 6"   Forward Step Up Right;2 sets;10 reps;Step Height: 6"     Knee/Hip Exercises: Seated   Long Arc Quad Strengthening;Left;2 sets;10 reps;Weights   Long Arc Quad Weight 4 lbs.     Knee/Hip Exercises: Supine   Heel Slides AROM;Left;2 sets;10 reps   Hip Adduction Isometric Strengthening;Both;2 sets;10 reps  5 sec hold   Straight Leg Raises AROM;Left;1 set;15 reps   Other Supine Knee/Hip Exercises L hip abduction AROM x20 reps                  PT Short Term Goals - 05/16/16 1511      PT SHORT TERM GOAL #1   Title Pt will be independent in his initial HEP.   Time 3   Period Weeks   Status New           PT Long Term Goals - 05/16/16 1508      PT LONG TERM GOAL #1   Title Independent with a HEP progression   Time 12   Period Weeks   Status New     PT LONG TERM GOAL #2   Title Pt will be able to ambulate 500 feet with straight cane with equalized step  length with no pain in left hip.    Baseline pain with short distance amb using a straight cane   Time 12   Period Weeks   Status New     PT LONG TERM GOAL #3   Title Pt will be able to improve left LE strength to 5/5 in hip flexion, abduction, adduction, and extension in order to improve gait.    Time 12   Period Weeks   Status New     PT LONG TERM GOAL #4   Title Pt will improve SLS on the L LE to 10 seconds in order to improve safety with amb and gait   Time 12   Period Weeks   Status New               Plan - 05/22/16 1352    Clinical Impression Statement Patient arrived to treatment with non existant L hip pain and only L knee pain. Patient taken through protocol appropriate exercises with posterior precautions. Patient only reported an exercise becoming painful as SLR in supine progressed. Patient unable to complete full AROM l hip abduction in SL thus exercise completed in both supine and standing. Patient understanding that he stands in R trunk lean secondary to leg length descrepancy and states that he will be getting the shoe lifts corrected after R TKR.  Patient educated to continue HEP that was provided for home even if exercises "don't seem important" as patient stated. Patient denied any modalities today.   Rehab Potential Good   Clinical Impairments Affecting Rehab Potential Right knee flexion contracture.   PT Frequency 2x / week   PT Duration 12 weeks   PT Treatment/Interventions ADLs/Self Care Home Management;Therapeutic activities;Therapeutic exercise;Neuromuscular re-education;Patient/family education;Manual techniques;Gait training;Stair training;Functional mobility training;Balance training;Taping;Dry needling;Cryotherapy;Electrical Stimulation   PT Next Visit Plan Nustep, LE strengthening, Total hip protocol exercises, posterior precautions   PT Home Exercise Plan establish HEP next treatment   Consulted and Agree with Plan of Care Patient      Patient  will benefit from skilled therapeutic intervention in order to improve the following deficits and impairments:  Abnormal gait, Decreased activity tolerance, Decreased range of motion, Decreased strength, Difficulty walking, Pain  Visit Diagnosis: Pain in left hip  Difficulty in walking, not elsewhere classified  Other abnormalities of gait and mobility  Stiffness of right knee, not elsewhere classified  Chronic pain of right knee     Problem List Patient Active Problem List   Diagnosis Date Noted  . Status post revision of total hip 04/24/2016    Wynelle Fanny, PTA 05/22/2016, 2:00 PM  [facility] [place], [place], [address] Phone: [phone]   Fax:  [phone]  Name: YSMAEL HIRES MRN: [id number] Date of Birth: 04/16/1945

## 2016-05-24 ENCOUNTER — Ambulatory Visit: Payer: Medicare Other | Admitting: Physical Therapy

## 2016-05-24 DIAGNOSIS — R262 Difficulty in walking, not elsewhere classified: Secondary | ICD-10-CM

## 2016-05-24 DIAGNOSIS — M25552 Pain in left hip: Secondary | ICD-10-CM | POA: Diagnosis not present

## 2016-05-24 NOTE — Therapy (Signed)
[facility] [place], [place], [address] Phone: [phone]   Fax:  [phone]  Physical Therapy Treatment  Patient Details  Name: Jonathan Harvey MRN: [id number] Date of Birth: [birth year] Referring Provider: [name], MD  Encounter Date: 05/24/2016      PT End of Session - 05/24/16 0954    Visit Number 3   Number of Visits 16   Date for PT Re-Evaluation 07/16/16   Authorization Type KX modifier after 15 visit   PT Start Time 0949   PT Stop Time 1030   PT Time Calculation (min) 41 min   Activity Tolerance Patient tolerated treatment well   Behavior During Therapy [facility] for tasks assessed/performed      Past Medical History:  Diagnosis Date  . Arthritis   . Blood dyscrasia    prostectomy    . Hepatitis    Hepatitis B  2008  . History of kidney stones   . Hypertension     Past Surgical History:  Procedure Laterality Date  . BREAST LUMPECTOMY Left    lump remove from left arm  . CARPAL TUNNEL RELEASE Right 2010  . curved femur Left   . HAND SURGERY Right 1987   right thumb and index finger  . HERNIA REPAIR  2008   inguineal  . JOINT REPLACEMENT Left 2002   hip replacement  . MASTOIDECTOMY  1982  . radical prostectomy    . TOTAL HIP REVISION Left 04/24/2016   Procedure: TOTAL HIP REVISION;  Surgeon: [name], MD;  Location: [place];  Service: Orthopedics;  Laterality: Left;    There were no vitals filed for this visit.      Subjective Assessment - 05/24/16 0955    Subjective Patient reports no pain in L hip but 5-6/10 pain in left knee.   Pertinent History sciatica problems from chronic back pain, pt taking gabapentin to help   Limitations Walking;House hold activities;Standing   How long can you sit comfortably? unlimited   How long can you stand comfortably? 10 minutes   How long can you walk comfortably? house hold distance using his straight cane   Patient Stated Goals Walk better   Currently  in Pain? Yes   Pain Score 5    Pain Location Knee   Pain Orientation Left   Pain Descriptors / Indicators Aching   Pain Type Surgical pain   Pain Onset More than a month ago   Pain Frequency Constant   Aggravating Factors  walking and standing   Pain Relieving Factors sitting, resting, meds   Effect of Pain on Daily Activities difficulty with chores , unable to perform yardwork                         [facility] Adult PT Treatment/Exercise - 05/24/16 0001      Knee/Hip Exercises: Aerobic   Nustep L5 x 10 min     Knee/Hip Exercises: Standing   Hip Abduction AROM;2 sets;10 reps;Knee straight;Both   Abduction Limitations VCs to stop ER R hip   Hip Extension Stengthening;Both;2 sets;10 reps;Knee straight   Lateral Step Up 2 sets;10 reps;Step Height: 8";Hand Hold: 2;Left   Forward Step Up 2 sets;10 reps;Step Height: 8";Both;Left     Manual Therapy   Manual Therapy Myofascial release;Soft tissue mobilization   Soft tissue mobilization to L mid and lateral quads   Myofascial Release to L ITB  PT Short Term Goals - 05/16/16 1511      PT SHORT TERM GOAL #1   Title Pt will be independent in his initial HEP.   Time 3   Period Weeks   Status New           PT Long Term Goals - 05/16/16 1508      PT LONG TERM GOAL #1   Title Independent with a HEP progression   Time 12   Period Weeks   Status New     PT LONG TERM GOAL #2   Title Pt will be able to ambulate 500 feet with straight cane with equalized step length with no pain in left hip.    Baseline pain with short distance amb using a straight cane   Time 12   Period Weeks   Status New     PT LONG TERM GOAL #3   Title Pt will be able to improve left LE strength to 5/5 in hip flexion, abduction, adduction, and extension in order to improve gait.    Time 12   Period Weeks   Status New     PT LONG TERM GOAL #4   Title Pt will improve SLS on the L LE to 10 seconds in order to  improve safety with amb and gait   Time 12   Period Weeks   Status New               Plan - 05/24/16 1251    Clinical Impression Statement Patient did very well with manual therapy today reporting significant improvement in mobility and decrease in pain upon standing after. He will benefit from continued STW/MFR to R quads and ITB.   PT Treatment/Interventions ADLs/Self Care Home Management;Therapeutic activities;Therapeutic exercise;Neuromuscular re-education;Patient/family education;Manual techniques;Gait training;Stair training;Functional mobility training;Balance training;Taping;Dry needling;Cryotherapy;Electrical Stimulation   PT Next Visit Plan Continue with manual to R quads and ITB, Nustep, LE strengthening, Total hip protocol exercises, posterior precautions. Progress HEP.      Patient will benefit from skilled therapeutic intervention in order to improve the following deficits and impairments:  Abnormal gait, Decreased activity tolerance, Decreased range of motion, Decreased strength, Difficulty walking, Pain  Visit Diagnosis: Difficulty in walking, not elsewhere classified     Problem List Patient Active Problem List   Diagnosis Date Noted  . Status post revision of total hip 04/24/2016    Madelyn Flavors PT 05/24/2016, 12:56 PM  [facility] [place], [place], [address] Phone: [phone]   Fax:  [phone]  Name: Jonathan Harvey MRN: [id number] Date of Birth: [birth year]

## 2016-05-27 ENCOUNTER — Encounter: Payer: Self-pay | Admitting: Physical Therapy

## 2016-05-27 ENCOUNTER — Ambulatory Visit: Payer: Medicare Other | Admitting: Physical Therapy

## 2016-05-27 DIAGNOSIS — M25552 Pain in left hip: Secondary | ICD-10-CM | POA: Diagnosis not present

## 2016-05-27 DIAGNOSIS — R2689 Other abnormalities of gait and mobility: Secondary | ICD-10-CM

## 2016-05-27 DIAGNOSIS — R262 Difficulty in walking, not elsewhere classified: Secondary | ICD-10-CM

## 2016-05-27 DIAGNOSIS — M25661 Stiffness of right knee, not elsewhere classified: Secondary | ICD-10-CM

## 2016-05-27 NOTE — Therapy (Signed)
[facility] [place], [place], [address] Phone: [phone]   Fax:  [phone]  Physical Therapy Treatment  Patient Details  Name: Jonathan Harvey MRN: [id number] Date of Birth: February 19, 1946 Referring Provider: [name], MD  Encounter Date: 05/27/2016      PT End of Session - 05/27/16 1435    Visit Number 4   Number of Visits 16   Date for PT Re-Evaluation 07/16/16   Authorization Type KX modifier after 15 visit   PT Start Time 1430   PT Stop Time 1515   PT Time Calculation (min) 45 min   Activity Tolerance Patient tolerated treatment well   Behavior During Therapy [facility] for tasks assessed/performed      Past Medical History:  Diagnosis Date  . Arthritis   . Blood dyscrasia    prostectomy    . Hepatitis    Hepatitis B  2008  . History of kidney stones   . Hypertension     Past Surgical History:  Procedure Laterality Date  . BREAST LUMPECTOMY Left    lump remove from left arm  . CARPAL TUNNEL RELEASE Right 2010  . curved femur Left   . HAND SURGERY Right 1987   right thumb and index finger  . HERNIA REPAIR  2008   inguineal  . JOINT REPLACEMENT Left 2002   hip replacement  . MASTOIDECTOMY  1982  . radical prostectomy    . TOTAL HIP REVISION Left 04/24/2016   Procedure: TOTAL HIP REVISION;  Surgeon: [name], MD;  Location: [facility];  Service: Orthopedics;  Laterality: Left;    There were no vitals filed for this visit.      Subjective Assessment - 05/27/16 1435    Subjective Reports that he had a community service event Saturday and was on his feet for 5-6 hours. Reports that at times she has a nauceous pain sensation from L knee pain and asked if ITB tightness could be affecting L knee pain.   Pertinent History sciatica problems from chronic back pain, pt taking gabapentin to help   Limitations Walking;House hold activities;Standing   How long can you sit comfortably? unlimited   How long can you  stand comfortably? 10 minutes   How long can you walk comfortably? house hold distance using his straight cane   Patient Stated Goals Walk better   Currently in Pain? No/denies            [facility] PT Assessment - 05/27/16 0001      Assessment   Medical Diagnosis s/p L total hip replacement revision   Onset Date/Surgical Date 04/24/16   Hand Dominance Right   Next MD Visit None scheduled   Prior Therapy yes for left knee pre-op preparation,     Precautions   Precautions Posterior Hip     Restrictions   Weight Bearing Restrictions No                     [facility] Adult PT Treatment/Exercise - 05/27/16 0001      Exercises   Exercises Knee/Hip     Knee/Hip Exercises: Aerobic   Nustep L3 x15 min     Knee/Hip Exercises: Standing   Hip Flexion AROM;Both;2 sets;10 reps;Knee bent   Hip Abduction AROM;2 sets;10 reps;Knee straight;Both   Abduction Limitations VCs to maintain neutral toe and avoid ER   Lateral Step Up 2 sets;10 reps;Step Height: 8";Hand Hold: 2;Left   Forward Step Up 2 sets;10 reps;Step Height:  8";Both;Left   Rocker Board 3 minutes     Knee/Hip Exercises: Seated   Sit to Sand 3 sets;10 reps;without UE support     Knee/Hip Exercises: Supine   Straight Leg Raises AROM;Left;2 sets;10 reps     Manual Therapy   Manual Therapy Myofascial release   Myofascial Release MFR to L ITB/lateral Quad in R sidelying to reduce tone                  PT Short Term Goals - 05/16/16 1511      PT SHORT TERM GOAL #1   Title Pt will be independent in his initial HEP.   Time 3   Period Weeks   Status New           PT Long Term Goals - 05/16/16 1508      PT LONG TERM GOAL #1   Title Independent with a HEP progression   Time 12   Period Weeks   Status New     PT LONG TERM GOAL #2   Title Pt will be able to ambulate 500 feet with straight cane with equalized step length with no pain in left hip.    Baseline pain with short distance amb using a  straight cane   Time 12   Period Weeks   Status New     PT LONG TERM GOAL #3   Title Pt will be able to improve left LE strength to 5/5 in hip flexion, abduction, adduction, and extension in order to improve gait.    Time 12   Period Weeks   Status New     PT LONG TERM GOAL #4   Title Pt will improve SLS on the L LE to 10 seconds in order to improve safety with amb and gait   Time 12   Period Weeks   Status New               Plan - 05/27/16 1521    Clinical Impression Statement Patient tolerated today's treatment well with no reports of any L hip pain or B knee pain upon arrival. Patient able to be guided through therapeutic exercise per protocol with caution regarding hip precautions. Patient continues to complete L forward step up with R trunk lean secondary to shoe lift. SLR weakness noted by patient today in clinic. Increased tone noted throughout L ITB musculature with increased sensitivity with palpation along anterior fibers of L ITB. Patient educated to try rolling L ITB and Quad in sitting with L knee extended for stretch during home manual therapy.   Rehab Potential Good   Clinical Impairments Affecting Rehab Potential Right knee flexion contracture.   PT Frequency 2x / week   PT Duration 12 weeks   PT Treatment/Interventions ADLs/Self Care Home Management;Therapeutic activities;Therapeutic exercise;Neuromuscular re-education;Patient/family education;Manual techniques;Gait training;Stair training;Functional mobility training;Balance training;Taping;Dry needling;Cryotherapy;Electrical Stimulation   PT Next Visit Plan Continue with manual to R quads and ITB, Nustep, LE strengthening, Total hip protocol exercises, posterior precautions. Progress HEP.   Consulted and Agree with Plan of Care Patient      Patient will benefit from skilled therapeutic intervention in order to improve the following deficits and impairments:  Abnormal gait, Decreased activity tolerance,  Decreased range of motion, Decreased strength, Difficulty walking, Pain  Visit Diagnosis: Difficulty in walking, not elsewhere classified  Pain in left hip  Other abnormalities of gait and mobility  Stiffness of right knee, not elsewhere classified     Problem List Patient Active  Problem List   Diagnosis Date Noted  . 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

## 2016-05-30 ENCOUNTER — Ambulatory Visit: Payer: Medicare Other | Admitting: *Deleted

## 2016-05-30 DIAGNOSIS — M25552 Pain in left hip: Secondary | ICD-10-CM

## 2016-05-30 DIAGNOSIS — R262 Difficulty in walking, not elsewhere classified: Secondary | ICD-10-CM

## 2016-05-30 DIAGNOSIS — R2689 Other abnormalities of gait and mobility: Secondary | ICD-10-CM

## 2016-05-30 NOTE — Therapy (Signed)
[facility] [place], [place], [address] Phone: [phone]   Fax:  [phone]  Physical Therapy Treatment  Patient Details  Name: Jonathan Harvey MRN: [id number] Date of Birth: [birth year] Referring Provider: [name], MD  Encounter Date: 05/30/2016      PT End of Session - 05/30/16 8921    Visit Number 5   Number of Visits 16   Date for PT Re-Evaluation 07/16/16   Authorization Type KX modifier after 15 visit   PT Start Time 1300   Activity Tolerance Patient tolerated treatment well   Behavior During Therapy [facility] for tasks assessed/performed      Past Medical History:  Diagnosis Date  . Arthritis   . Blood dyscrasia    prostectomy    . Hepatitis    Hepatitis B  2008  . History of kidney stones   . Hypertension     Past Surgical History:  Procedure Laterality Date  . BREAST LUMPECTOMY Left    lump remove from left arm  . CARPAL TUNNEL RELEASE Right 2010  . curved femur Left   . HAND SURGERY Right 1987   right thumb and index finger  . HERNIA REPAIR  2008   inguineal  . JOINT REPLACEMENT Left 2002   hip replacement  . MASTOIDECTOMY  1982  . radical prostectomy    . TOTAL HIP REVISION Left 04/24/2016   Procedure: TOTAL HIP REVISION;  Surgeon: [name], MD;  Location: [facility];  Service: Orthopedics;  Laterality: Left;    There were no vitals filed for this visit.                       [facility] Adult PT Treatment/Exercise - 05/30/16 0001      Exercises   Exercises Knee/Hip     Knee/Hip Exercises: Aerobic   Nustep L3 x15 min     Knee/Hip Exercises: Standing   Hip Flexion AROM;Both;2 sets;10 reps;Knee bent   Hip Abduction AROM;2 sets;10 reps;Knee straight;Both   Abduction Limitations VCs to maintain neutral toe and avoid ER   Lateral Step Up 2 sets;10 reps;Step Height: 8";Hand Hold: 2;Left   Forward Step Up 2 sets;10 reps;Step Height: 8";Both;Left   Rocker Board 3 minutes     Knee/Hip Exercises: Seated   Sit to Sand 3 sets;10 reps;without UE support     Knee/Hip Exercises: Supine   Straight Leg Raises AROM;Left;2 sets;10 reps     Knee/Hip Exercises: Sidelying   Hip ABduction Left;10 reps;AAROM;3 sets                  PT Short Term Goals - 05/16/16 1511      PT SHORT TERM GOAL #1   Title Pt will be independent in his initial HEP.   Time 3   Period Weeks   Status New           PT Long Term Goals - 05/16/16 1508      PT LONG TERM GOAL #1   Title Independent with a HEP progression   Time 12   Period Weeks   Status New     PT LONG TERM GOAL #2   Title Pt will be able to ambulate 500 feet with straight cane with equalized step length with no pain in left hip.    Baseline pain with short distance amb using a straight cane   Time 12   Period Weeks   Status New  PT LONG TERM GOAL #3   Title Pt will be able to improve left LE strength to 5/5 in hip flexion, abduction, adduction, and extension in order to improve gait.    Time 12   Period Weeks   Status New     PT LONG TERM GOAL #4   Title Pt will improve SLS on the L LE to 10 seconds in order to improve safety with amb and gait   Time 12   Period Weeks   Status New               Plan - 05/30/16 1309    Clinical Impression Statement Pt arrived doing fairly well today. LT hip 2-3/10, RT knee hurts more 8/10.  Pt was able to perform all LE Act.'s and therex for LT hip with minimal pain increase. He was a little challenged by sidelying hip abdution due to weakness still. 3-/5 hip abduction. HEP given for SLR and hip abduction   Rehab Potential Good   Clinical Impairments Affecting Rehab Potential Right knee flexion contracture.   PT Frequency 2x / week   PT Duration 12 weeks   PT Treatment/Interventions ADLs/Self Care Home Management;Therapeutic activities;Therapeutic exercise;Neuromuscular re-education;Patient/family education;Manual techniques;Gait training;Stair  training;Functional mobility training;Balance training;Taping;Dry needling;Cryotherapy;Electrical Stimulation   PT Next Visit Plan Continue with manual to R quads and ITB, Nustep, LE strengthening, Total hip protocol exercises, posterior precautions. Progress HEP.   PT Home Exercise Plan establish HEP next treatment   Consulted and Agree with Plan of Care Patient      Patient will benefit from skilled therapeutic intervention in order to improve the following deficits and impairments:  Abnormal gait, Decreased activity tolerance, Decreased range of motion, Decreased strength, Difficulty walking, Pain  Visit Diagnosis: Difficulty in walking, not elsewhere classified  Pain in left hip  Other abnormalities of gait and mobility     Problem List Patient Active Problem List   Diagnosis Date Noted  . Status post revision of total hip 04/24/2016    Jonathan Harvey,Jonathan Harvey,PTA 05/30/2016, 2:35 PM  [facility] [place], [place], [address] Phone: [phone]   Fax:  [phone]  Name: Jonathan Harvey MRN: [id number] Date of Birth: Mar 07, 1945

## 2016-06-03 ENCOUNTER — Ambulatory Visit: Payer: Medicare Other | Attending: Orthopedic Surgery | Admitting: Physical Therapy

## 2016-06-03 ENCOUNTER — Encounter: Payer: Self-pay | Admitting: Physical Therapy

## 2016-06-03 DIAGNOSIS — G8929 Other chronic pain: Secondary | ICD-10-CM | POA: Diagnosis present

## 2016-06-03 DIAGNOSIS — R262 Difficulty in walking, not elsewhere classified: Secondary | ICD-10-CM | POA: Diagnosis not present

## 2016-06-03 DIAGNOSIS — M25661 Stiffness of right knee, not elsewhere classified: Secondary | ICD-10-CM | POA: Diagnosis present

## 2016-06-03 DIAGNOSIS — M25552 Pain in left hip: Secondary | ICD-10-CM | POA: Diagnosis present

## 2016-06-03 DIAGNOSIS — M25561 Pain in right knee: Secondary | ICD-10-CM | POA: Insufficient documentation

## 2016-06-03 DIAGNOSIS — R2689 Other abnormalities of gait and mobility: Secondary | ICD-10-CM | POA: Insufficient documentation

## 2016-06-03 NOTE — Therapy (Signed)
[facility] [place], [place], [address] Phone: [phone]   Fax:  [phone]  Physical Therapy Treatment  Patient Details  Name: Jonathan Harvey MRN: [id number] Date of Birth: 1945/05/08 Referring Provider: [name], MD  Encounter Date: 06/03/2016      PT End of Session - 06/03/16 1400    Visit Number 6   Number of Visits 16   Date for PT Re-Evaluation 07/16/16   Authorization Type KX modifier after 15 visit   PT Start Time 9528   PT Stop Time 1429   PT Time Calculation (min) 40 min   Activity Tolerance Patient tolerated treatment well   Behavior During Therapy [facility] for tasks assessed/performed      Past Medical History:  Diagnosis Date  . Arthritis   . Blood dyscrasia    prostectomy    . Hepatitis    Hepatitis B  2008  . History of kidney stones   . Hypertension     Past Surgical History:  Procedure Laterality Date  . BREAST LUMPECTOMY Left    lump remove from left arm  . CARPAL TUNNEL RELEASE Right 2010  . curved femur Left   . HAND SURGERY Right 1987   right thumb and index finger  . HERNIA REPAIR  2008   inguineal  . JOINT REPLACEMENT Left 2002   hip replacement  . MASTOIDECTOMY  1982  . radical prostectomy    . TOTAL HIP REVISION Left 04/24/2016   Procedure: TOTAL HIP REVISION;  Surgeon: [name], MD;  Location: [facility];  Service: Orthopedics;  Laterality: Left;    There were no vitals filed for this visit.      Subjective Assessment - 06/03/16 1356    Subjective Reports that his hip is "doing great" but experienced knee discomfort following stationary bike trial. Reports that previously he was noticing difficulty in walking upon waking in the mornings but reports that walking was easier this morning upon waking.   Pertinent History sciatica problems from chronic back pain, pt taking gabapentin to help   Limitations Walking;House hold activities;Standing   How long can you sit comfortably?  unlimited   How long can you stand comfortably? 10 minutes   How long can you walk comfortably? house hold distance using his straight cane   Patient Stated Goals Walk better   Currently in Pain? Yes   Pain Score 5    Pain Location Knee   Pain Orientation Right;Left   Pain Descriptors / Indicators Aching   Pain Type Surgical pain   Pain Onset More than a month ago            [facility] PT Assessment - 06/03/16 0001      Assessment   Medical Diagnosis s/p L total hip replacement revision   Onset Date/Surgical Date 04/24/16   Hand Dominance Right   Next MD Visit None scheduled   Prior Therapy yes for left knee pre-op preparation,     Precautions   Precautions Posterior Hip     Restrictions   Weight Bearing Restrictions No                     [facility] Adult PT Treatment/Exercise - 06/03/16 0001      Knee/Hip Exercises: Aerobic   Nustep L5 x15 min     Knee/Hip Exercises: Standing   Hip Abduction Stengthening;Left;3 sets;10 reps;Knee straight   Hip Extension Stengthening;Left;3 sets;10 reps;Knee straight   Lateral Step Up Left;3 sets;10  reps;Hand Hold: 2;Step Height: 8"   Forward Step Up Left;3 sets;10 reps;Hand Hold: 2;Step Height: 8"   Step Down Left;3 sets;10 reps;Hand Hold: 2;Step Height: 6"     Knee/Hip Exercises: Seated   Sit to Sand 15 reps;without UE support  seated on 2 pillows     Knee/Hip Exercises: Supine   Bridges with Ball Squeeze Strengthening;Both;2 sets;10 reps  2# ball   Straight Leg Raises AROM;Left;2 sets;10 reps   Straight Leg Raise with External Rotation AROM;Left;2 sets;10 reps                  PT Short Term Goals - 06/03/16 1357      PT SHORT TERM GOAL #1   Title Pt will be independent in his initial HEP.   Time 3   Period Weeks   Status Achieved           PT Long Term Goals - 06/03/16 1357      PT LONG TERM GOAL #1   Title Independent with a HEP progression   Time 12   Period Weeks   Status On-going      PT LONG TERM GOAL #2   Title Pt will be able to ambulate 500 feet with straight cane with equalized step length with no pain in left hip.    Baseline pain with short distance amb using a straight cane   Time 12   Period Weeks   Status On-going     PT LONG TERM GOAL #3   Title Pt will be able to improve left LE strength to 5/5 in hip flexion, abduction, adduction, and extension in order to improve gait.    Time 12   Period Weeks   Status On-going     PT LONG TERM GOAL #4   Title Pt will improve SLS on the L LE to 10 seconds in order to improve safety with amb and gait   Time 12   Period Weeks   Status On-going               Plan - 06/03/16 1429    Clinical Impression Statement Patient tolerated today's treatment well with hip weakness still noted with hip abduction exercises. Patient attempted stationary bike but unable secondary to B knee pain. Patient had no reports of increased pain with any exercises today although he had increased difficulty with low solid chair and required two pillows to be able to complete sit to stands without UE support. Weakness also noted with SLR with ER in supine. Patient notes only difficulty at home being step down from 8 in or greater step down into sunroom at home but reports that is mostly from knee pain.   Rehab Potential Good   Clinical Impairments Affecting Rehab Potential Right knee flexion contracture.   PT Frequency 2x / week   PT Duration 12 weeks   PT Treatment/Interventions ADLs/Self Care Home Management;Therapeutic activities;Therapeutic exercise;Neuromuscular re-education;Patient/family education;Manual techniques;Gait training;Stair training;Functional mobility training;Balance training;Taping;Dry needling;Cryotherapy;Electrical Stimulation   PT Next Visit Plan Continue with manual to R quads and ITB, Nustep, LE strengthening, Total hip protocol exercises, posterior precautions. Progress HEP.   Consulted and Agree with Plan of Care  Patient      Patient will benefit from skilled therapeutic intervention in order to improve the following deficits and impairments:  Abnormal gait, Decreased activity tolerance, Decreased range of motion, Decreased strength, Difficulty walking, Pain  Visit Diagnosis: Difficulty in walking, not elsewhere classified  Pain in left hip  Other  abnormalities of gait and mobility  Stiffness of right knee, not elsewhere classified  Chronic pain of right knee     Problem List Patient Active Problem List   Diagnosis Date Noted  . 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

## 2016-06-05 ENCOUNTER — Encounter: Payer: Self-pay | Admitting: Physical Therapy

## 2016-06-05 ENCOUNTER — Ambulatory Visit: Payer: Medicare Other | Admitting: Physical Therapy

## 2016-06-05 DIAGNOSIS — R2689 Other abnormalities of gait and mobility: Secondary | ICD-10-CM

## 2016-06-05 DIAGNOSIS — R262 Difficulty in walking, not elsewhere classified: Secondary | ICD-10-CM | POA: Diagnosis not present

## 2016-06-05 DIAGNOSIS — M25561 Pain in right knee: Secondary | ICD-10-CM

## 2016-06-05 DIAGNOSIS — M25661 Stiffness of right knee, not elsewhere classified: Secondary | ICD-10-CM

## 2016-06-05 DIAGNOSIS — G8929 Other chronic pain: Secondary | ICD-10-CM

## 2016-06-05 DIAGNOSIS — M25552 Pain in left hip: Secondary | ICD-10-CM

## 2016-06-05 NOTE — Therapy (Signed)
[facility] [place], [place], [address] Phone: [phone]   Fax:  [phone]  Physical Therapy Treatment  Patient Details  Name: Jonathan Harvey MRN: [id number] Date of Birth: 06/01/1945 Referring Provider: [name], MD  Encounter Date: 06/05/2016      PT End of Session - 06/05/16 1346    Visit Number 7   Number of Visits 16   Date for PT Re-Evaluation 07/16/16   Authorization Type KX modifier after 15 visit   PT Start Time 1344   PT Stop Time 1430   PT Time Calculation (min) 46 min   Activity Tolerance Patient tolerated treatment well   Behavior During Therapy [facility] for tasks assessed/performed      Past Medical History:  Diagnosis Date  . Arthritis   . Blood dyscrasia    prostectomy    . Hepatitis    Hepatitis B  2008  . History of kidney stones   . Hypertension     Past Surgical History:  Procedure Laterality Date  . BREAST LUMPECTOMY Left    lump remove from left arm  . CARPAL TUNNEL RELEASE Right 2010  . curved femur Left   . HAND SURGERY Right 1987   right thumb and index finger  . HERNIA REPAIR  2008   inguineal  . JOINT REPLACEMENT Left 2002   hip replacement  . MASTOIDECTOMY  1982  . radical prostectomy    . TOTAL HIP REVISION Left 04/24/2016   Procedure: TOTAL HIP REVISION;  Surgeon: [name], MD;  Location: [place];  Service: Orthopedics;  Laterality: Left;    There were no vitals filed for this visit.      Subjective Assessment - 06/05/16 1346    Subjective Reports that since previous treatment he has had increased pain especially in anterior and posterior regions. States that following last treatment he picked up a bag of 50# grass seed. Reports that his surgeon did not seem too worried about it. States that walking now without cane takes effort.   Pertinent History sciatica problems from chronic back pain, pt taking gabapentin to help   Limitations Walking;House hold  activities;Standing   How long can you sit comfortably? unlimited   How long can you stand comfortably? 10 minutes   How long can you walk comfortably? house hold distance using his straight cane   Patient Stated Goals Walk better   Currently in Pain? Yes   Pain Score --  "pretty bad" but no numerical rating   Pain Location Hip   Pain Orientation Left   Pain Descriptors / Indicators Sharp   Pain Type Surgical pain   Pain Onset More than a month ago            [facility] PT Assessment - 06/05/16 0001      Assessment   Medical Diagnosis s/p L total hip replacement revision   Onset Date/Surgical Date 04/24/16   Hand Dominance Right   Prior Therapy yes for left knee pre-op preparation,     Precautions   Precautions Posterior Hip     Restrictions   Weight Bearing Restrictions No                     [facility] Adult PT Treatment/Exercise - 06/05/16 0001      Knee/Hip Exercises: Aerobic   Nustep L5 x15 min     Knee/Hip Exercises: Standing   Hip Abduction Stengthening;Left;3 sets;10 reps;Knee straight   Hip Extension Stengthening;Left;3 sets;10  reps;Knee straight   Forward Step Up Left;3 sets;10 reps;Hand Hold: 2;Step Height: 8"     Knee/Hip Exercises: Seated   Sit to Sand 3 sets;10 reps;without UE support     Knee/Hip Exercises: Supine   Straight Leg Raises AROM;Left;2 sets;10 reps     Knee/Hip Exercises: Sidelying   Hip ABduction AROM;Left;3 sets;10 reps     Manual Therapy   Manual Therapy Soft tissue mobilization   Soft tissue mobilization STW to L Quad, ITB, and anterior hip to reduce pain and tone in supine                  PT Short Term Goals - 06/03/16 1357      PT SHORT TERM GOAL #1   Title Pt will be independent in his initial HEP.   Time 3   Period Weeks   Status Achieved           PT Long Term Goals - 06/03/16 1357      PT LONG TERM GOAL #1   Title Independent with a HEP progression   Time 12   Period Weeks   Status  On-going     PT LONG TERM GOAL #2   Title Pt will be able to ambulate 500 feet with straight cane with equalized step length with no pain in left hip.    Baseline pain with short distance amb using a straight cane   Time 12   Period Weeks   Status On-going     PT LONG TERM GOAL #3   Title Pt will be able to improve left LE strength to 5/5 in hip flexion, abduction, adduction, and extension in order to improve gait.    Time 12   Period Weeks   Status On-going     PT LONG TERM GOAL #4   Title Pt will improve SLS on the L LE to 10 seconds in order to improve safety with amb and gait   Time 12   Period Weeks   Status On-going               Plan - 06/05/16 1452    Clinical Impression Statement Patient tolerated today's treatment fair as he arrived to treatment with reports of increased pain since previous treatment. Patient reported minimal pain with NuStep and pain was reassessed frequently throughout treatment. Patient denied any further pain but compliant with hip precautions during exercise (pillow between knees for sidelying to avoid hip adduction. Tone noted throughout L ITB, Quad, and along direct anterior hip. Patient no longer experiences painful tightness per patient report. Tone decreased moderately as manual therapy progressed.   Rehab Potential Good   Clinical Impairments Affecting Rehab Potential Right knee flexion contracture.   PT Frequency 2x / week   PT Duration 12 weeks   PT Treatment/Interventions ADLs/Self Care Home Management;Therapeutic activities;Therapeutic exercise;Neuromuscular re-education;Patient/family education;Manual techniques;Gait training;Stair training;Functional mobility training;Balance training;Taping;Dry needling;Cryotherapy;Electrical Stimulation   PT Next Visit Plan Continue with manual to R quads and ITB, Nustep, LE strengthening, Total hip protocol exercises, posterior precautions. Progress HEP.   Consulted and Agree with Plan of Care  Patient      Patient will benefit from skilled therapeutic intervention in order to improve the following deficits and impairments:  Abnormal gait, Decreased activity tolerance, Decreased range of motion, Decreased strength, Difficulty walking, Pain  Visit Diagnosis: Difficulty in walking, not elsewhere classified  Pain in left hip  Other abnormalities of gait and mobility  Stiffness of right knee, not elsewhere  classified  Chronic pain of right knee     Problem List Patient Active Problem List   Diagnosis Date Noted  . 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

## 2016-06-10 ENCOUNTER — Encounter: Payer: Self-pay | Admitting: Physical Therapy

## 2016-06-10 ENCOUNTER — Ambulatory Visit: Payer: Medicare Other | Admitting: Physical Therapy

## 2016-06-10 DIAGNOSIS — R2689 Other abnormalities of gait and mobility: Secondary | ICD-10-CM

## 2016-06-10 DIAGNOSIS — M25552 Pain in left hip: Secondary | ICD-10-CM

## 2016-06-10 DIAGNOSIS — M25661 Stiffness of right knee, not elsewhere classified: Secondary | ICD-10-CM

## 2016-06-10 DIAGNOSIS — M25561 Pain in right knee: Secondary | ICD-10-CM

## 2016-06-10 DIAGNOSIS — R262 Difficulty in walking, not elsewhere classified: Secondary | ICD-10-CM

## 2016-06-10 DIAGNOSIS — G8929 Other chronic pain: Secondary | ICD-10-CM

## 2016-06-10 NOTE — Therapy (Signed)
[facility] [place], [place], [address] Phone: [phone]   Fax:  [phone]  Physical Therapy Treatment  Patient Details  Name: Jonathan Harvey MRN: [id number] Date of Birth: 1945/11/18 Referring Provider: [name], MD  Encounter Date: 06/10/2016      PT End of Session - 06/10/16 1427    Visit Number 8   Number of Visits 16   Date for PT Re-Evaluation 07/16/16   Authorization Type KX modifier after 15 visit   PT Start Time 6283   PT Stop Time 1437   PT Time Calculation (min) 42 min   Activity Tolerance Patient tolerated treatment well   Behavior During Therapy [facility] for tasks assessed/performed      Past Medical History:  Diagnosis Date  . Arthritis   . Blood dyscrasia    prostectomy    . Hepatitis    Hepatitis B  2008  . History of kidney stones   . Hypertension     Past Surgical History:  Procedure Laterality Date  . BREAST LUMPECTOMY Left    lump remove from left arm  . CARPAL TUNNEL RELEASE Right 2010  . curved femur Left   . HAND SURGERY Right 1987   right thumb and index finger  . HERNIA REPAIR  2008   inguineal  . JOINT REPLACEMENT Left 2002   hip replacement  . MASTOIDECTOMY  1982  . radical prostectomy    . TOTAL HIP REVISION Left 04/24/2016   Procedure: TOTAL HIP REVISION;  Surgeon: Jonathan Pear, MD;  Location: [facility];  Service: Orthopedics;  Laterality: Left;    There were no vitals filed for this visit.      Subjective Assessment - 06/10/16 1357    Subjective Patient reported ongoing increased pain in left hip and reported it may have been from lifting a 75#-100# machine   Pertinent History sciatica problems from chronic back pain, pt taking gabapentin to help   Limitations Walking;House hold activities;Standing   How long can you sit comfortably? unlimited   How long can you stand comfortably? 10 minutes   How long can you walk comfortably? house hold distance using his straight cane    Patient Stated Goals Walk better   Currently in Pain? Yes   Pain Score 6    Pain Location Hip   Pain Orientation Left   Pain Type Surgical pain   Pain Onset More than a month ago   Pain Frequency Constant   Aggravating Factors  walking and prolong standing   Pain Relieving Factors at rest or assist device SPC   Pain Score 3   Pain Location Knee   Pain Orientation Right   Pain Descriptors / Indicators Aching   Pain Type Chronic pain   Pain Onset More than a month ago   Pain Frequency Intermittent   Aggravating Factors  prolong standing   Pain Relieving Factors rest                         [facility] Adult PT Treatment/Exercise - 06/10/16 0001      Knee/Hip Exercises: Aerobic   Nustep L5 x15 min UE/LE activity     Knee/Hip Exercises: Standing   Heel Raises Both;2 sets;10 reps   Lateral Step Up Left;3 sets;10 reps;Step Height: 6"   Forward Step Up Left;3 sets;10 reps;Step Height: 6"   Other Standing Knee Exercises reviweed standing hip flexion, abd, ext x10 each, cues required for technique  Knee/Hip Exercises: Seated   Sit to Sand 3 sets;10 reps;without UE support  focus on left LE     Knee/Hip Exercises: Supine   Bridges with Jonathan Harvey Squeeze Strengthening;Both;2 sets;10 reps  grey ball for squeese   Straight Leg Raises AROM;Left;2 sets;10 reps     Knee/Hip Exercises: Sidelying   Hip ABduction AROM;Left;3 sets;10 reps     Knee/Hip Exercises: Prone   Straight Leg Raises Strengthening;Left;10 reps                  PT Short Term Goals - 06/03/16 1357      PT SHORT TERM GOAL #1   Title Pt will be independent in his initial HEP.   Time 3   Period Weeks   Status Achieved           PT Long Term Goals - 06/03/16 1357      PT LONG TERM GOAL #1   Title Independent with a HEP progression   Time 12   Period Weeks   Status On-going     PT LONG TERM GOAL #2   Title Pt will be able to ambulate 500 feet with straight cane with equalized  step length with no pain in left hip.    Baseline pain with short distance amb using a straight cane   Time 12   Period Weeks   Status On-going     PT LONG TERM GOAL #3   Title Pt will be able to improve left LE strength to 5/5 in hip flexion, abduction, adduction, and extension in order to improve gait.    Time 12   Period Weeks   Status On-going     PT LONG TERM GOAL #4   Title Pt will improve SLS on the L LE to 10 seconds in order to improve safety with amb and gait   Time 12   Period Weeks   Status On-going               Plan - 06/10/16 1431    Clinical Impression Statement Patient tolerated treatment well today. Patient reported no increased pain from exercises. Patient reported more pain in left hip than right knee. Today focused on left hip strengthening. Educated patient on healing and protocol and following MD orders to avoid injury. Reviewed standing left hip exercises to correct technique for proper strengthening. Goals ongoing due to pain and strength deficts.     Rehab Potential Good   Clinical Impairments Affecting Rehab Potential Right knee flexion contracture.   PT Frequency 2x / week   PT Duration 12 weeks   PT Treatment/Interventions ADLs/Self Care Home Management;Therapeutic activities;Therapeutic exercise;Neuromuscular re-education;Patient/family education;Manual techniques;Gait training;Stair training;Functional mobility training;Balance training;Taping;Dry needling;Cryotherapy;Electrical Stimulation   PT Next Visit Plan Continue with manual to R quads and ITB, Nustep, LE strengthening, Total hip protocol exercises, posterior precautions. Progress HEP.   Consulted and Agree with Plan of Care Patient      Patient will benefit from skilled therapeutic intervention in order to improve the following deficits and impairments:  Abnormal gait, Decreased activity tolerance, Decreased range of motion, Decreased strength, Difficulty walking, Pain  Visit  Diagnosis: Difficulty in walking, not elsewhere classified  Pain in left hip  Other abnormalities of gait and mobility  Stiffness of right knee, not elsewhere classified  Chronic pain of right knee     Problem List Patient Active Problem List   Diagnosis Date Noted  . 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 [place], [place], [address] Phone: [phone]   Fax:  [phone]  Name: Jonathan Harvey MRN: [id number] Date of Birth: 17-Sep-1945

## 2016-06-13 ENCOUNTER — Ambulatory Visit: Payer: Medicare Other | Admitting: *Deleted

## 2016-06-13 DIAGNOSIS — G8929 Other chronic pain: Secondary | ICD-10-CM

## 2016-06-13 DIAGNOSIS — R2689 Other abnormalities of gait and mobility: Secondary | ICD-10-CM

## 2016-06-13 DIAGNOSIS — R262 Difficulty in walking, not elsewhere classified: Secondary | ICD-10-CM | POA: Diagnosis not present

## 2016-06-13 DIAGNOSIS — M25552 Pain in left hip: Secondary | ICD-10-CM

## 2016-06-13 DIAGNOSIS — M25561 Pain in right knee: Secondary | ICD-10-CM

## 2016-06-13 DIAGNOSIS — M25661 Stiffness of right knee, not elsewhere classified: Secondary | ICD-10-CM

## 2016-06-13 NOTE — Therapy (Signed)
[facility] [place], [place], [address] Phone: [phone]   Fax:  [phone]  Physical Therapy Treatment  Patient Details  Name: Jonathan Harvey MRN: [id number] Date of Birth: 01-31-1946 Referring Provider: [name], MD  Encounter Date: 06/13/2016      PT End of Session - 06/13/16 1326    Visit Number 9   Number of Visits 16   Date for PT Re-Evaluation 07/16/16   Authorization Type KX modifier after 15 visit   PT Start Time 1309   PT Stop Time 1355   PT Time Calculation (min) 46 min      Past Medical History:  Diagnosis Date  . Arthritis   . Blood dyscrasia    prostectomy    . Hepatitis    Hepatitis B  2008  . History of kidney stones   . Hypertension     Past Surgical History:  Procedure Laterality Date  . BREAST LUMPECTOMY Left    lump remove from left arm  . CARPAL TUNNEL RELEASE Right 2010  . curved femur Left   . HAND SURGERY Right 1987   right thumb and index finger  . HERNIA REPAIR  2008   inguineal  . JOINT REPLACEMENT Left 2002   hip replacement  . MASTOIDECTOMY  1982  . radical prostectomy    . TOTAL HIP REVISION Left 04/24/2016   Procedure: TOTAL HIP REVISION;  Surgeon: [name], MD;  Location: [facility];  Service: Orthopedics;  Laterality: Left;    There were no vitals filed for this visit.      Subjective Assessment - 06/13/16 1339    Subjective Patient reported ongoing increased pain in left hip and reported it may have been from lifting a 75#-100# machine.  10 mins late due to traffic.  Walking with my cane again to let my hip rest   Pertinent History sciatica problems from chronic back pain, pt taking gabapentin to help   Limitations Walking;House hold activities;Standing   How long can you sit comfortably? unlimited   How long can you stand comfortably? 10 minutes   How long can you walk comfortably? house hold distance using his straight cane   Currently in Pain? Yes   Pain  Score 4    Pain Location Hip   Pain Orientation Left   Pain Descriptors / Indicators Sharp   Pain Type Surgical pain   Pain Onset More than a month ago   Pain Frequency Intermittent                         [facility] Adult PT Treatment/Exercise - 06/13/16 0001      Knee/Hip Exercises: Aerobic   Nustep L5 x15 min UE/LE activity     Knee/Hip Exercises: Standing   Hip Abduction Stengthening;Left;3 sets;10 reps;Knee straight   Lateral Step Up Left;3 sets;10 reps;Step Height: 6"   Forward Step Up Left;3 sets;10 reps;Step Height: 6"     Knee/Hip Exercises: Supine   Bridges with Ball Squeeze Strengthening;Both;2 sets;10 reps  grey ball for squeese   Straight Leg Raises AROM;Left;2 sets;10 reps     Knee/Hip Exercises: Sidelying   Hip ABduction AROM;Left;3 sets;10 reps                  PT Short Term Goals - 06/03/16 1357      PT SHORT TERM GOAL #1   Title Pt will be independent in his initial HEP.   Time 3  Period Weeks   Status Achieved           PT Long Term Goals - 06/03/16 1357      PT LONG TERM GOAL #1   Title Independent with a HEP progression   Time 12   Period Weeks   Status On-going     PT LONG TERM GOAL #2   Title Pt will be able to ambulate 500 feet with straight cane with equalized step length with no pain in left hip.    Baseline pain with short distance amb using a straight cane   Time 12   Period Weeks   Status On-going     PT LONG TERM GOAL #3   Title Pt will be able to improve left LE strength to 5/5 in hip flexion, abduction, adduction, and extension in order to improve gait.    Time 12   Period Weeks   Status On-going     PT LONG TERM GOAL #4   Title Pt will improve SLS on the L LE to 10 seconds in order to improve safety with amb and gait   Time 12   Period Weeks   Status On-going               Plan - 06/13/16 1327    Clinical Impression Statement Pt arrived today doing fairly well, still worrie a little  from picking up machine at home the other day. Focused on LT hip strengthening and balanmce. He was able to complete all therex and act.'s with pain staying around 3-4/10. Pt is also using his SPC to ambulate some to decrease pressure on LT hip.  No LTGs met today due to weakness and gait deviations   Rehab Potential Good   Clinical Impairments Affecting Rehab Potential Right knee flexion contracture.   PT Frequency 2x / week   PT Duration 12 weeks   PT Next Visit Plan Continue with manual to R quads and ITB, Nustep, LE strengthening, Total hip protocol exercises, posterior precautions. Progress HEP.   PT Home Exercise Plan establish HEP next treatment   Consulted and Agree with Plan of Care Patient      Patient will benefit from skilled therapeutic intervention in order to improve the following deficits and impairments:  Abnormal gait, Decreased activity tolerance, Decreased range of motion, Decreased strength, Difficulty walking, Pain  Visit Diagnosis: Difficulty in walking, not elsewhere classified  Pain in left hip  Other abnormalities of gait and mobility  Stiffness of right knee, not elsewhere classified  Chronic pain of right knee     Problem List Patient Active Problem List   Diagnosis Date Noted  . 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

## 2016-06-18 ENCOUNTER — Encounter: Payer: Self-pay | Admitting: Physical Therapy

## 2016-06-18 ENCOUNTER — Ambulatory Visit: Payer: Medicare Other | Admitting: Physical Therapy

## 2016-06-18 DIAGNOSIS — G8929 Other chronic pain: Secondary | ICD-10-CM

## 2016-06-18 DIAGNOSIS — M25661 Stiffness of right knee, not elsewhere classified: Secondary | ICD-10-CM

## 2016-06-18 DIAGNOSIS — M25561 Pain in right knee: Secondary | ICD-10-CM

## 2016-06-18 DIAGNOSIS — R262 Difficulty in walking, not elsewhere classified: Secondary | ICD-10-CM | POA: Diagnosis not present

## 2016-06-18 DIAGNOSIS — M25552 Pain in left hip: Secondary | ICD-10-CM

## 2016-06-18 DIAGNOSIS — R2689 Other abnormalities of gait and mobility: Secondary | ICD-10-CM

## 2016-06-18 NOTE — Therapy (Signed)
[facility] [place], [place], [address] Phone: [phone]   Fax:  [phone]  Physical Therapy Treatment  Patient Details  Name: Jonathan Harvey MRN: [id number] Date of Birth: 01-Sep-1945 Referring Provider: [name], MD  Encounter Date: 06/18/2016      PT End of Session - 06/18/16 1445    Visit Number 10   Number of Visits 16   Date for PT Re-Evaluation 07/16/16   Authorization Type KX modifier after 15 visit   PT Start Time 1436   PT Stop Time 1522   PT Time Calculation (min) 46 min   Activity Tolerance Patient tolerated treatment well   Behavior During Therapy [facility] for tasks assessed/performed      Past Medical History:  Diagnosis Date  . Arthritis   . Blood dyscrasia    prostectomy    . Hepatitis    Hepatitis B  2008  . History of kidney stones   . Hypertension     Past Surgical History:  Procedure Laterality Date  . BREAST LUMPECTOMY Left    lump remove from left arm  . CARPAL TUNNEL RELEASE Right 2010  . curved femur Left   . HAND SURGERY Right 1987   right thumb and index finger  . HERNIA REPAIR  2008   inguineal  . JOINT REPLACEMENT Left 2002   hip replacement  . MASTOIDECTOMY  1982  . radical prostectomy    . TOTAL HIP REVISION Left 04/24/2016   Procedure: TOTAL HIP REVISION;  Surgeon: [name], MD;  Location: [place];  Service: Orthopedics;  Laterality: Left;    There were no vitals filed for this visit.      Subjective Assessment - 06/18/16 1438    Subjective Reports that he is better and has been religious about his HEP. Has been doing 3x a time sometimes 4 reps of 10.   Pertinent History sciatica problems from chronic back pain, pt taking gabapentin to help   Limitations Walking;House hold activities;Standing   How long can you sit comfortably? unlimited   How long can you stand comfortably? 10 minutes   How long can you walk comfortably? house hold distance using his straight cane    Patient Stated Goals Walk better   Currently in Pain? No/denies            [facility] PT Assessment - 06/18/16 0001      Assessment   Medical Diagnosis s/p L total hip replacement revision   Onset Date/Surgical Date 04/24/16   Hand Dominance Right   Next MD Visit 10/2016   Prior Therapy yes for left knee pre-op preparation,     Precautions   Precautions Posterior Hip     Restrictions   Weight Bearing Restrictions No                     [facility] Adult PT Treatment/Exercise - 06/18/16 0001      Knee/Hip Exercises: Aerobic   Nustep L5 x15 min UE/LE activity     Knee/Hip Exercises: Standing   Hip Flexion AROM;Both;3 sets;10 reps;Knee bent   Hip Abduction Stengthening;Both;3 sets;10 reps;Knee straight   Lateral Step Up Left;3 sets;10 reps;Step Height: 6"   Forward Step Up Left;3 sets;10 reps;Step Height: 6"     Knee/Hip Exercises: Supine   Bridges with Diona Foley Squeeze Strengthening;Both;2 sets;10 reps   Straight Leg Raises AROM;Left;3 sets;10 reps     Knee/Hip Exercises: Sidelying   Hip ABduction AROM;Left;3 sets;10 reps  PT Short Term Goals - 06/03/16 1357      PT SHORT TERM GOAL #1   Title Pt will be independent in his initial HEP.   Time 3   Period Weeks   Status Achieved           PT Long Term Goals - 06/03/16 1357      PT LONG TERM GOAL #1   Title Independent with a HEP progression   Time 12   Period Weeks   Status On-going     PT LONG TERM GOAL #2   Title Pt will be able to ambulate 500 feet with straight cane with equalized step length with no pain in left hip.    Baseline pain with short distance amb using a straight cane   Time 12   Period Weeks   Status On-going     PT LONG TERM GOAL #3   Title Pt will be able to improve left LE strength to 5/5 in hip flexion, abduction, adduction, and extension in order to improve gait.    Time 12   Period Weeks   Status On-going     PT LONG TERM GOAL #4   Title Pt will  improve SLS on the L LE to 10 seconds in order to improve safety with amb and gait   Time 12   Period Weeks   Status On-going               Plan - 06/18/16 1525    Clinical Impression Statement Patient arrived to treatment with no L hip pain and reports of diligance with HEP at home. Patient guided through various standing exercises for both strengthening and initial proprioceptive strengthening. Improvement noted in sidelying hip abduction today as well. Patient encouraged to continue HEP and use of cane as he says that he still has some L lateral hip pain at times with ambulation.    Rehab Potential Good   Clinical Impairments Affecting Rehab Potential Right knee flexion contracture.   PT Frequency 2x / week   PT Duration 12 weeks   PT Treatment/Interventions ADLs/Self Care Home Management;Therapeutic activities;Therapeutic exercise;Neuromuscular re-education;Patient/family education;Manual techniques;Gait training;Stair training;Functional mobility training;Balance training;Taping;Dry needling;Cryotherapy;Electrical Stimulation   PT Next Visit Plan Continue with manual to R quads and ITB, Nustep, LE strengthening, Total hip protocol exercises, posterior precautions. Progress HEP.   Consulted and Agree with Plan of Care Patient      Patient will benefit from skilled therapeutic intervention in order to improve the following deficits and impairments:  Abnormal gait, Decreased activity tolerance, Decreased range of motion, Decreased strength, Difficulty walking, Pain  Visit Diagnosis: Difficulty in walking, not elsewhere classified  Pain in left hip  Other abnormalities of gait and mobility  Stiffness of right knee, not elsewhere classified  Chronic pain of right knee     Problem List Patient Active Problem List   Diagnosis Date Noted  . 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 [place], [place], [address] Phone: [phone]   Fax:  [phone]  Name: Jonathan Harvey MRN: [id number] Date of Birth: 1946/02/02

## 2016-06-20 ENCOUNTER — Ambulatory Visit: Payer: Medicare Other | Admitting: *Deleted

## 2016-06-20 DIAGNOSIS — R262 Difficulty in walking, not elsewhere classified: Secondary | ICD-10-CM

## 2016-06-20 DIAGNOSIS — R2689 Other abnormalities of gait and mobility: Secondary | ICD-10-CM

## 2016-06-20 DIAGNOSIS — M25661 Stiffness of right knee, not elsewhere classified: Secondary | ICD-10-CM

## 2016-06-20 DIAGNOSIS — M25552 Pain in left hip: Secondary | ICD-10-CM

## 2016-06-20 NOTE — Therapy (Signed)
[facility] [place], [place], [address] Phone: [phone]   Fax:  [phone]  Physical Therapy Treatment  Patient Details  Name: Jonathan Harvey MRN: [id number] Date of Birth: Dec 08, 1945 Referring Provider: [name], MD  Encounter Date: 06/20/2016      PT End of Session - 06/20/16 1542    Visit Number 11   Number of Visits 16   Date for PT Re-Evaluation 07/16/16   Authorization Type KX modifier after 15 visit   PT Start Time 1345   PT Stop Time 1436   PT Time Calculation (min) 51 min      Past Medical History:  Diagnosis Date  . Arthritis   . Blood dyscrasia    prostectomy    . Hepatitis    Hepatitis B  2008  . History of kidney stones   . Hypertension     Past Surgical History:  Procedure Laterality Date  . BREAST LUMPECTOMY Left    lump remove from left arm  . CARPAL TUNNEL RELEASE Right 2010  . curved femur Left   . HAND SURGERY Right 1987   right thumb and index finger  . HERNIA REPAIR  2008   inguineal  . JOINT REPLACEMENT Left 2002   hip replacement  . MASTOIDECTOMY  1982  . radical prostectomy    . TOTAL HIP REVISION Left 04/24/2016   Procedure: TOTAL HIP REVISION;  Surgeon: [name], MD;  Location: [facility];  Service: Orthopedics;  Laterality: Left;    There were no vitals filed for this visit.                       [facility] Adult PT Treatment/Exercise - 06/20/16 0001      Knee/Hip Exercises: Aerobic   Nustep L5 x15 min UE/LE activity     Knee/Hip Exercises: Standing   Hip Flexion AROM;Both;3 sets;10 reps;Knee bent   Hip Abduction Stengthening;Both;3 sets;10 reps;Knee straight   Lateral Step Up Left;3 sets;10 reps;Step Height: 6"   Forward Step Up Left;3 sets;10 reps;Step Height: 6"     Knee/Hip Exercises: Supine   Bridges with Diona Foley Squeeze Strengthening;Both;2 sets;10 reps   Straight Leg Raises AROM;Left;3 sets;10 reps     Knee/Hip Exercises: Sidelying   Hip  ABduction AROM;Left;10 reps;AAROM;5 sets  needs AAROM for full ROM                  PT Short Term Goals - 06/03/16 1357      PT SHORT TERM GOAL #1   Title Pt will be independent in his initial HEP.   Time 3   Period Weeks   Status Achieved           PT Long Term Goals - 06/03/16 1357      PT LONG TERM GOAL #1   Title Independent with a HEP progression   Time 12   Period Weeks   Status On-going     PT LONG TERM GOAL #2   Title Pt will be able to ambulate 500 feet with straight cane with equalized step length with no pain in left hip.    Baseline pain with short distance amb using a straight cane   Time 12   Period Weeks   Status On-going     PT LONG TERM GOAL #3   Title Pt will be able to improve left LE strength to 5/5 in hip flexion, abduction, adduction, and extension in order to improve gait.  Time 12   Period Weeks   Status On-going     PT LONG TERM GOAL #4   Title Pt will improve SLS on the L LE to 10 seconds in order to improve safety with amb and gait   Time 12   Period Weeks   Status On-going               Plan - 06/20/16 1530    Clinical Impression Statement Pt arrives to clinic today feeling fairly good with minimal hip pain. He reports he has been more diligent with hip abduction strengthening in sidelying. He continues to have some strength deficits in hip abduction and is unable to goal due to weakness 3-/5   Rehab Potential Good   Clinical Impairments Affecting Rehab Potential Right knee flexion contracture.   PT Frequency 2x / week   PT Duration 12 weeks   PT Treatment/Interventions ADLs/Self Care Home Management;Therapeutic activities;Therapeutic exercise;Neuromuscular re-education;Patient/family education;Manual techniques;Gait training;Stair training;Functional mobility training;Balance training;Taping;Dry needling;Cryotherapy;Electrical Stimulation   PT Next Visit Plan Continue with manual to R quads and ITB, Nustep, LE  strengthening, Total hip protocol exercises, posterior precautions. Progress HEP.   PT Home Exercise Plan establish HEP next treatment   Consulted and Agree with Plan of Care Patient      Patient will benefit from skilled therapeutic intervention in order to improve the following deficits and impairments:  Abnormal gait, Decreased activity tolerance, Decreased range of motion, Decreased strength, Difficulty walking, Pain  Visit Diagnosis: Difficulty in walking, not elsewhere classified  Pain in left hip  Other abnormalities of gait and mobility  Stiffness of right knee, not elsewhere classified     Problem List Patient Active Problem List   Diagnosis Date Noted  . 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

## 2016-06-24 ENCOUNTER — Encounter: Payer: Medicare Other | Admitting: Physical Therapy

## 2016-06-27 ENCOUNTER — Ambulatory Visit: Payer: Medicare Other | Admitting: Physical Therapy

## 2016-06-27 ENCOUNTER — Encounter: Payer: Self-pay | Admitting: Physical Therapy

## 2016-06-27 DIAGNOSIS — G8929 Other chronic pain: Secondary | ICD-10-CM

## 2016-06-27 DIAGNOSIS — M25661 Stiffness of right knee, not elsewhere classified: Secondary | ICD-10-CM

## 2016-06-27 DIAGNOSIS — M25552 Pain in left hip: Secondary | ICD-10-CM

## 2016-06-27 DIAGNOSIS — M25561 Pain in right knee: Secondary | ICD-10-CM

## 2016-06-27 DIAGNOSIS — R262 Difficulty in walking, not elsewhere classified: Secondary | ICD-10-CM

## 2016-06-27 DIAGNOSIS — R2689 Other abnormalities of gait and mobility: Secondary | ICD-10-CM

## 2016-06-27 NOTE — Therapy (Signed)
[facility] [place], [place], [address] Phone: [phone]   Fax:  [phone]  Physical Therapy Treatment  Patient Details  Name: Jonathan Harvey MRN: [id number] Date of Birth: 1946/01/31 Referring Provider: [name], MD  Encounter Date: 06/27/2016      PT End of Session - 06/27/16 1439    Visit Number 12   Number of Visits 16   Date for PT Re-Evaluation 07/16/16   Authorization Type KX modifier after 15 visit   PT Start Time 8101   PT Stop Time 1512   PT Time Calculation (min) 40 min   Activity Tolerance Patient tolerated treatment well   Behavior During Therapy [facility] for tasks assessed/performed      Past Medical History:  Diagnosis Date  . Arthritis   . Blood dyscrasia    prostectomy    . Hepatitis    Hepatitis B  2008  . History of kidney stones   . Hypertension     Past Surgical History:  Procedure Laterality Date  . BREAST LUMPECTOMY Left    lump remove from left arm  . CARPAL TUNNEL RELEASE Right 2010  . curved femur Left   . HAND SURGERY Right 1987   right thumb and index finger  . HERNIA REPAIR  2008   inguineal  . JOINT REPLACEMENT Left 2002   hip replacement  . MASTOIDECTOMY  1982  . radical prostectomy    . TOTAL HIP REVISION Left 04/24/2016   Procedure: TOTAL HIP REVISION;  Surgeon: [name], MD;  Location: [facility];  Service: Orthopedics;  Laterality: Left;    There were no vitals filed for this visit.      Subjective Assessment - 06/27/16 1439    Subjective Reports that he can now walk comfortably short distances without cane but if he has to walk a long distance that he uses cane. Reports that he is thinking of calling to schedule knee replacement.   Pertinent History sciatica problems from chronic back pain, pt taking gabapentin to help   Limitations Walking;House hold activities;Standing   How long can you sit comfortably? unlimited   How long can you stand comfortably? 10  minutes   How long can you walk comfortably? house hold distance using his straight cane   Currently in Pain? No/denies            [facility] PT Assessment - 06/27/16 0001      Assessment   Medical Diagnosis s/p L total hip replacement revision   Onset Date/Surgical Date 04/24/16   Hand Dominance Right   Next MD Visit 10/2016   Prior Therapy yes for left knee pre-op preparation,     Precautions   Precautions Posterior Hip     Restrictions   Weight Bearing Restrictions No                     [facility] Adult PT Treatment/Exercise - 06/27/16 0001      Knee/Hip Exercises: Aerobic   Nustep L6 x15 min UE/LE activity     Knee/Hip Exercises: Standing   Hip Flexion AROM;Both;3 sets;10 reps;Knee bent   Hip Extension Stengthening;Left;3 sets;10 reps;Knee straight   Lateral Step Up Left;3 sets;10 reps;Hand Hold: 2;Step Height: 8"   Forward Step Up Left;3 sets;10 reps;Hand Hold: 2;Step Height: 8"   SLS 2 trials x max hold     Knee/Hip Exercises: Supine   Hip Adduction Isometric Strengthening;Both;2 sets;10 reps  5 sec hold   Straight Leg  Raises AROM;Left;3 sets;10 reps     Knee/Hip Exercises: Sidelying   Hip ABduction AROM;Left;3 sets;10 reps                  PT Short Term Goals - 06/03/16 1357      PT SHORT TERM GOAL #1   Title Pt will be independent in his initial HEP.   Time 3   Period Weeks   Status Achieved           PT Long Term Goals - 06/03/16 1357      PT LONG TERM GOAL #1   Title Independent with a HEP progression   Time 12   Period Weeks   Status On-going     PT LONG TERM GOAL #2   Title Pt will be able to ambulate 500 feet with straight cane with equalized step length with no pain in left hip.    Baseline pain with short distance amb using a straight cane   Time 12   Period Weeks   Status On-going     PT LONG TERM GOAL #3   Title Pt will be able to improve left LE strength to 5/5 in hip flexion, abduction, adduction, and  extension in order to improve gait.    Time 12   Period Weeks   Status On-going     PT LONG TERM GOAL #4   Title Pt will improve SLS on the L LE to 10 seconds in order to improve safety with amb and gait   Time 12   Period Weeks   Status On-going               Plan - 06/27/16 1513    Clinical Impression Statement Patient continues to tolerate treatments well with no reports of any increased pain in L hip during treatment. Patient guided through steps with increased step height with no difficulties reported by patient. Standing hip flexion completed bilaterally to improve stability in affected hip and simulate SLS. Weakness still noted in L hip abductors with patient more vigilant with exercise technique today. SLS fairly difficult for patient on gym floor today with only intermittant UE and toe touch support.   Rehab Potential Good   Clinical Impairments Affecting Rehab Potential Right knee flexion contracture.   PT Frequency 2x / week   PT Duration 12 weeks   PT Treatment/Interventions ADLs/Self Care Home Management;Therapeutic activities;Therapeutic exercise;Neuromuscular re-education;Patient/family education;Manual techniques;Gait training;Stair training;Functional mobility training;Balance training;Taping;Dry needling;Cryotherapy;Electrical Stimulation   PT Next Visit Plan Continue with manual to R quads and ITB, Nustep, LE strengthening, Total hip protocol exercises, posterior precautions. Progress HEP.   Consulted and Agree with Plan of Care Patient      Patient will benefit from skilled therapeutic intervention in order to improve the following deficits and impairments:  Abnormal gait, Decreased activity tolerance, Decreased range of motion, Decreased strength, Difficulty walking, Pain  Visit Diagnosis: Difficulty in walking, not elsewhere classified  Pain in left hip  Other abnormalities of gait and mobility  Stiffness of right knee, not elsewhere  classified  Chronic pain of right knee     Problem List Patient Active Problem List   Diagnosis Date Noted  . 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

## 2016-07-02 ENCOUNTER — Ambulatory Visit: Payer: Medicare Other | Attending: Orthopedic Surgery | Admitting: *Deleted

## 2016-07-02 DIAGNOSIS — M25552 Pain in left hip: Secondary | ICD-10-CM | POA: Diagnosis present

## 2016-07-02 DIAGNOSIS — M25561 Pain in right knee: Secondary | ICD-10-CM | POA: Insufficient documentation

## 2016-07-02 DIAGNOSIS — G8929 Other chronic pain: Secondary | ICD-10-CM | POA: Diagnosis present

## 2016-07-02 DIAGNOSIS — M25661 Stiffness of right knee, not elsewhere classified: Secondary | ICD-10-CM | POA: Insufficient documentation

## 2016-07-02 DIAGNOSIS — R2689 Other abnormalities of gait and mobility: Secondary | ICD-10-CM | POA: Insufficient documentation

## 2016-07-02 DIAGNOSIS — R262 Difficulty in walking, not elsewhere classified: Secondary | ICD-10-CM | POA: Insufficient documentation

## 2016-07-02 NOTE — Therapy (Signed)
[facility] [place], [place], [address] Phone: [phone]   Fax:  [phone]  Physical Therapy Treatment  Patient Details  Name: Jonathan Harvey MRN: [id number] Date of Birth: 08/22/1945 Referring Provider: [name], MD  Encounter Date: 07/02/2016      PT End of Session - 07/02/16 1050    Visit Number 13   Number of Visits 16   Date for PT Re-Evaluation 07/16/16   Authorization Type KX modifier after 15 visit   PT Start Time 0900   PT Stop Time 0950   PT Time Calculation (min) 50 min      Past Medical History:  Diagnosis Date  . Arthritis   . Blood dyscrasia    prostectomy    . Hepatitis    Hepatitis B  2008  . History of kidney stones   . Hypertension     Past Surgical History:  Procedure Laterality Date  . BREAST LUMPECTOMY Left    lump remove from left arm  . CARPAL TUNNEL RELEASE Right 2010  . curved femur Left   . HAND SURGERY Right 1987   right thumb and index finger  . HERNIA REPAIR  2008   inguineal  . JOINT REPLACEMENT Left 2002   hip replacement  . MASTOIDECTOMY  1982  . radical prostectomy    . TOTAL HIP REVISION Left 04/24/2016   Procedure: TOTAL HIP REVISION;  Surgeon: [name], MD;  Location: [facility];  Service: Orthopedics;  Laterality: Left;    There were no vitals filed for this visit.      Subjective Assessment - 07/02/16 0900    Subjective I  was doing a lot around the house on Saturday and had a sharp pain and it felt like something moved in my LT HIP. I have still been exercising it and it's doing a little better.6-7/10 this morning , but now 2-3/10   Pertinent History sciatica problems from chronic back pain, pt taking gabapentin to help   Limitations Walking;House hold activities;Standing   How long can you sit comfortably? unlimited   How long can you stand comfortably? 10 minutes   How long can you walk comfortably? house hold distance using his straight cane   Patient  Stated Goals Walk better   Currently in Pain? Yes   Pain Score 3    Pain Location Hip   Pain Orientation Left   Pain Descriptors / Indicators Sharp   Pain Type Surgical pain   Pain Onset More than a month ago   Pain Frequency Intermittent                         [facility] Adult PT Treatment/Exercise - 07/02/16 0001      Knee/Hip Exercises: Aerobic   Nustep L6 x15 min UE/LE activity     Knee/Hip Exercises: Standing   Hip Flexion --   Hip Abduction Stengthening;Both;3 sets;10 reps;Knee straight   Lateral Step Up --   Forward Step Up --   Rocker Board 5 minutes     Knee/Hip Exercises: Supine   Bridges with Clamshell Strengthening;Both;3 sets;10 reps   Straight Leg Raises AROM;Left;3 sets;10 reps     Knee/Hip Exercises: Sidelying   Hip ABduction AROM;Left;10 reps;5 sets;AAROM  Pt needed assistance for Full ROM                  PT Short Term Goals - 06/03/16 1357      PT SHORT  TERM GOAL #1   Title Pt will be independent in his initial HEP.   Time 3   Period Weeks   Status Achieved           PT Long Term Goals - 06/03/16 1357      PT LONG TERM GOAL #1   Title Independent with a HEP progression   Time 12   Period Weeks   Status On-going     PT LONG TERM GOAL #2   Title Pt will be able to ambulate 500 feet with straight cane with equalized step length with no pain in left hip.    Baseline pain with short distance amb using a straight cane   Time 12   Period Weeks   Status On-going     PT LONG TERM GOAL #3   Title Pt will be able to improve left LE strength to 5/5 in hip flexion, abduction, adduction, and extension in order to improve gait.    Time 12   Period Weeks   Status On-going     PT LONG TERM GOAL #4   Title Pt will improve SLS on the L LE to 10 seconds in order to improve safety with amb and gait   Time 12   Period Weeks   Status On-going               Plan - 07/02/16 1051    Clinical Impression Statement Pt  arrived to clinic today with some increased soreness in RT hip due to having some pain and the sensation of his hip or a muscle moving. He was able to complete all LT hip strengthening exs and balance act's with minimal complaints and no sensation of instability.LT hip ABDuction strength is still of concern. He still needs assistance in sidelying position to complete Full ROM .   Rehab Potential Good   Clinical Impairments Affecting Rehab Potential Right knee flexion contracture.   PT Frequency 2x / week   PT Duration 12 weeks   PT Treatment/Interventions ADLs/Self Care Home Management;Therapeutic activities;Therapeutic exercise;Neuromuscular re-education;Patient/family education;Manual techniques;Gait training;Stair training;Functional mobility training;Balance training;Taping;Dry needling;Cryotherapy;Electrical Stimulation   PT Next Visit Plan Continue with manual to R quads and ITB, Nustep, LE strengthening, Total hip protocol exercises, posterior precautions. Progress HEP.   PT Home Exercise Plan establish HEP next treatment   Consulted and Agree with Plan of Care Patient      Patient will benefit from skilled therapeutic intervention in order to improve the following deficits and impairments:  Abnormal gait, Decreased activity tolerance, Decreased range of motion, Decreased strength, Difficulty walking, Pain  Visit Diagnosis: Difficulty in walking, not elsewhere classified  Pain in left hip     Problem List Patient Active Problem List   Diagnosis Date Noted  . Status post revision of total hip 04/24/2016    [name],[name], PTA 07/02/2016, 11:11 AM  [facility] [place], [place], [address] Phone: [phone]   Fax:  [phone]  Name: Jonathan Harvey MRN: [id number] Date of Birth: [birth year]

## 2016-07-04 ENCOUNTER — Ambulatory Visit: Payer: Medicare Other | Admitting: *Deleted

## 2016-07-04 DIAGNOSIS — M25552 Pain in left hip: Secondary | ICD-10-CM

## 2016-07-04 DIAGNOSIS — R2689 Other abnormalities of gait and mobility: Secondary | ICD-10-CM

## 2016-07-04 DIAGNOSIS — R262 Difficulty in walking, not elsewhere classified: Secondary | ICD-10-CM

## 2016-07-04 NOTE — Therapy (Signed)
[facility] [place], [place], [address] Phone: [phone]   Fax:  [phone]  Physical Therapy Treatment  Patient Details  Name: Jonathan Harvey MRN: [id number] Date of Birth: [birth year] Referring Provider: [name], MD  Encounter Date: 07/04/2016      PT End of Session - 07/04/16 1048    Visit Number 14   Number of Visits 16   Date for PT Re-Evaluation 07/16/16   PT Start Time 0900   PT Stop Time 0949   PT Time Calculation (min) 49 min      Past Medical History:  Diagnosis Date  . Arthritis   . Blood dyscrasia    prostectomy    . Hepatitis    Hepatitis B  2008  . History of kidney stones   . Hypertension     Past Surgical History:  Procedure Laterality Date  . BREAST LUMPECTOMY Left    lump remove from left arm  . CARPAL TUNNEL RELEASE Right 2010  . curved femur Left   . HAND SURGERY Right 1987   right thumb and index finger  . HERNIA REPAIR  2008   inguineal  . JOINT REPLACEMENT Left 2002   hip replacement  . MASTOIDECTOMY  1982  . radical prostectomy    . TOTAL HIP REVISION Left 04/24/2016   Procedure: TOTAL HIP REVISION;  Surgeon: [name], MD;  Location: [facility];  Service: Orthopedics;  Laterality: Left;    There were no vitals filed for this visit.      Subjective Assessment - 07/04/16 0921    Pertinent History sciatica problems from chronic back pain, pt taking gabapentin to help   Limitations Walking;House hold activities;Standing   How long can you sit comfortably? unlimited   How long can you stand comfortably? 10 minutes   How long can you walk comfortably? house hold distance using his straight cane   Patient Stated Goals Walk better   Currently in Pain? Yes   Pain Score 3    Pain Location Hip   Pain Orientation Left   Pain Descriptors / Indicators Sore;Sharp   Pain Type Surgical pain   Pain Onset More than a month ago                         [facility] Adult PT  Treatment/Exercise - 07/04/16 0001      Knee/Hip Exercises: Stretches   Hip Flexor Stretch Left;3 reps;30 seconds  HEP     Knee/Hip Exercises: Aerobic   Nustep L6 x15 min UE/LE activity     Knee/Hip Exercises: Standing   Hip Flexion AROM;Both;3 sets;10 reps;Knee bent   Hip Abduction Stengthening;Both;3 sets;10 reps;Knee straight  also added Y Tband around knees x 20     Manual Therapy   Manual Therapy Passive ROM   Passive ROM AAROM with sidelying Abduction 3 x 10 working in [employer],   Hip flexor stretching     Worked on standing postures and gait             PT Short Term Goals - 06/03/16 1357      PT SHORT TERM GOAL #1   Title Pt will be independent in his initial HEP.   Time 3   Period Weeks   Status Achieved           PT Long Term Goals - 06/03/16 1357      PT LONG TERM GOAL #1   Title Independent with  a HEP progression   Time 12   Period Weeks   Status On-going     PT LONG TERM GOAL #2   Title Pt will be able to ambulate 500 feet with straight cane with equalized step length with no pain in left hip.    Baseline pain with short distance amb using a straight cane   Time 12   Period Weeks   Status On-going     PT LONG TERM GOAL #3   Title Pt will be able to improve left LE strength to 5/5 in hip flexion, abduction, adduction, and extension in order to improve gait.    Time 12   Period Weeks   Status On-going     PT LONG TERM GOAL #4   Title Pt will improve SLS on the L LE to 10 seconds in order to improve safety with amb and gait   Time 12   Period Weeks   Status On-going               Plan - 07/04/16 0957    Clinical Impression Statement Pt arrived to clinic doing a little better today with discomfort in LT hip. He still my F/U with MD for evaluation.Marland Kitchen He was able to perform all therex and act.'s, but RT hip abduction weakness is evident. 3-/10 with sidelying Abduction and needs assistance. We focused on eccentrics from higher  ranges and reviewed standing posture to decrease LT. Pt was also sore with palpation over LT ASIS  and Hip flexor area   Rehab Potential Good   Clinical Impairments Affecting Rehab Potential Right knee flexion contracture.   PT Frequency 2x / week   PT Duration 12 weeks   PT Treatment/Interventions ADLs/Self Care Home Management;Therapeutic activities;Therapeutic exercise;Neuromuscular re-education;Patient/family education;Manual techniques;Gait training;Stair training;Functional mobility training;Balance training;Taping;Dry needling;Cryotherapy;Electrical Stimulation   PT Next Visit Plan Continue with manual to R quads and ITB, Nustep, LE strengthening, Total hip protocol exercises, posterior precautions. Progress HEP.  STW and hip flexor stretching next visit   PT Home Exercise Plan establish HEP next treatment   Consulted and Agree with Plan of Care Patient      Patient will benefit from skilled therapeutic intervention in order to improve the following deficits and impairments:  Abnormal gait, Decreased activity tolerance, Decreased range of motion, Decreased strength, Difficulty walking, Pain  Visit Diagnosis: Difficulty in walking, not elsewhere classified  Pain in left hip  Other abnormalities of gait and mobility     Problem List Patient Active Problem List   Diagnosis Date Noted  . Status post revision of total hip 04/24/2016    Justo Hengel,[name], PTA 07/04/2016, 10:52 AM  [facility] [place], [place], [address] Phone: [phone]   Fax:  [phone]  Name: KOLBE DELMONACO MRN: [id number] Date of Birth: 1945/10/31

## 2016-07-09 ENCOUNTER — Encounter: Payer: Medicare Other | Admitting: *Deleted

## 2016-07-11 ENCOUNTER — Encounter: Payer: Medicare Other | Admitting: *Deleted

## 2016-07-17 ENCOUNTER — Encounter: Payer: Self-pay | Admitting: Physical Therapy

## 2016-07-17 ENCOUNTER — Ambulatory Visit: Payer: Medicare Other | Admitting: Physical Therapy

## 2016-07-17 DIAGNOSIS — M25661 Stiffness of right knee, not elsewhere classified: Secondary | ICD-10-CM

## 2016-07-17 DIAGNOSIS — R2689 Other abnormalities of gait and mobility: Secondary | ICD-10-CM

## 2016-07-17 DIAGNOSIS — R262 Difficulty in walking, not elsewhere classified: Secondary | ICD-10-CM | POA: Diagnosis not present

## 2016-07-17 DIAGNOSIS — M25561 Pain in right knee: Secondary | ICD-10-CM

## 2016-07-17 DIAGNOSIS — G8929 Other chronic pain: Secondary | ICD-10-CM

## 2016-07-17 DIAGNOSIS — M25552 Pain in left hip: Secondary | ICD-10-CM

## 2016-07-17 NOTE — Therapy (Signed)
[facility] [place], [place], [address] Phone: [phone]   Fax:  [phone]  Physical Therapy Treatment  Patient Details  Name: Jonathan Harvey MRN: [id number] Date of Birth: 24-Dec-1945 Referring Provider: [name], MD  Encounter Date: 07/17/2016      PT End of Session - 07/17/16 1316    Visit Number 15   Number of Visits 16   Date for PT Re-Evaluation 07/16/16   Authorization Type KX modifier after 15 visit   PT Start Time 1301   PT Stop Time 1348   PT Time Calculation (min) 47 min   Activity Tolerance Patient tolerated treatment well   Behavior During Therapy [facility] for tasks assessed/performed      Past Medical History:  Diagnosis Date  . Arthritis   . Blood dyscrasia    prostectomy    . Hepatitis    Hepatitis B  2008  . History of kidney stones   . Hypertension     Past Surgical History:  Procedure Laterality Date  . BREAST LUMPECTOMY Left    lump remove from left arm  . CARPAL TUNNEL RELEASE Right 2010  . curved femur Left   . HAND SURGERY Right 1987   right thumb and index finger  . HERNIA REPAIR  2008   inguineal  . JOINT REPLACEMENT Left 2002   hip replacement  . MASTOIDECTOMY  1982  . radical prostectomy    . TOTAL HIP REVISION Left 04/24/2016   Procedure: TOTAL HIP REVISION;  Surgeon: [name], MD;  Location: [facility];  Service: Orthopedics;  Laterality: Left;    There were no vitals filed for this visit.      Subjective Assessment - 07/17/16 1306    Subjective Reports that he was unable to take his meds as he was in [place]. Reports trying new combination but spent a lot of time yesterday gardening. Reports continued compliance with HEP and has scheduled R TKR 08/26/2016.   Pertinent History sciatica problems from chronic back pain, pt taking gabapentin to help   Limitations Walking;House hold activities;Standing   How long can you sit comfortably? unlimited   How long can you stand  comfortably? 10 minutes   How long can you walk comfortably? house hold distance using his straight cane   Patient Stated Goals Walk better   Currently in Pain? Yes   Pain Score 8    Pain Location Hip   Pain Orientation Left   Pain Descriptors / Indicators Sharp   Pain Type Surgical pain   Pain Onset More than a month ago            [facility] PT Assessment - 07/17/16 0001      Assessment   Medical Diagnosis s/p L total hip replacement revision   Onset Date/Surgical Date 04/24/16   Hand Dominance Right   Next MD Visit None for L hip   Prior Therapy yes for left knee pre-op preparation,     Precautions   Precautions Posterior Hip     Restrictions   Weight Bearing Restrictions No                     [facility] Adult PT Treatment/Exercise - 07/17/16 0001      Knee/Hip Exercises: Aerobic   Nustep L6 x15 min UE/LE activity     Knee/Hip Exercises: Standing   Hip ADduction Strengthening;Left;20 reps  Eccentric L hip abduction deloading with green theraband    Lateral Step Up  Left;3 sets;10 reps;Hand Hold: 2;Step Height: 8"   Forward Step Up Left;3 sets;10 reps;Hand Hold: 2;Step Height: 8"     Knee/Hip Exercises: Seated   Sit to Sand Other (comment)  Unable secondary to pain following three attempts     Knee/Hip Exercises: Supine   Short Arc Quad Sets Strengthening;Left;2 sets;10 reps   Short Arc [employer] Limitations with 2# ball squeeze   Bridges Limitations 2x10 reps   Bridges with Clamshell Strengthening;Both;2 sets;10 reps  Green theraband     Knee/Hip Exercises: Sidelying   Hip ABduction AROM;Left;2 sets;10 reps                  PT Short Term Goals - 06/03/16 1357      PT SHORT TERM GOAL #1   Title Pt will be independent in his initial HEP.   Time 3   Period Weeks   Status Achieved           PT Long Term Goals - 06/03/16 1357      PT LONG TERM GOAL #1   Title Independent with a HEP progression   Time 12   Period Weeks   Status  On-going     PT LONG TERM GOAL #2   Title Pt will be able to ambulate 500 feet with straight cane with equalized step length with no pain in left hip.    Baseline pain with short distance amb using a straight cane   Time 12   Period Weeks   Status On-going     PT LONG TERM GOAL #3   Title Pt will be able to improve left LE strength to 5/5 in hip flexion, abduction, adduction, and extension in order to improve gait.    Time 12   Period Weeks   Status On-going     PT LONG TERM GOAL #4   Title Pt will improve SLS on the L LE to 10 seconds in order to improve safety with amb and gait   Time 12   Period Weeks   Status On-going               Plan - 07/17/16 1423    Clinical Impression Statement Patient tolerated today's treatment well as more advanced hip strengthening and stabilization exercises completed. Eccentric deloading for improvement of L hip abduction with green theraband completed today with no complaints from patient. Hip stabilization exercises completed with resistance with bridging activities to improve LPH complex.   Rehab Potential Good   Clinical Impairments Affecting Rehab Potential Right knee flexion contracture.   PT Frequency 2x / week   PT Duration 12 weeks   PT Treatment/Interventions ADLs/Self Care Home Management;Therapeutic activities;Therapeutic exercise;Neuromuscular re-education;Patient/family education;Manual techniques;Gait training;Stair training;Functional mobility training;Balance training;Taping;Dry needling;Cryotherapy;Electrical Stimulation   PT Next Visit Plan D/C next treatment and provide information regarding gym program.   Consulted and Agree with Plan of Care Patient      Patient will benefit from skilled therapeutic intervention in order to improve the following deficits and impairments:  Abnormal gait, Decreased activity tolerance, Decreased range of motion, Decreased strength, Difficulty walking, Pain  Visit Diagnosis: Difficulty  in walking, not elsewhere classified  Pain in left hip  Other abnormalities of gait and mobility  Stiffness of right knee, not elsewhere classified  Chronic pain of right knee     Problem List Patient Active Problem List   Diagnosis Date Noted  . Status post revision of total hip 04/24/2016    Wynelle Fanny, PTA  07/17/2016, 2:29 PM  [facility] [place], [place], [address] Phone: [phone]   Fax:  [phone]  Name: Jonathan Harvey MRN: [id number] Date of Birth: August 26, 1945

## 2016-07-23 ENCOUNTER — Ambulatory Visit: Payer: Medicare Other | Admitting: Physical Therapy

## 2016-07-23 NOTE — Therapy (Signed)
[facility] [place], [place], [address] Phone: [phone]   Fax:  [phone]  Physical Therapy Treatment  Patient Details  Name: Jonathan Harvey MRN: [id number] Date of Birth: [birth year] Referring Provider: [name], MD  Encounter Date: 07/23/2016    Past Medical History:  Diagnosis Date  . Arthritis   . Blood dyscrasia    prostectomy    . Hepatitis    Hepatitis B  2008  . History of kidney stones   . Hypertension     Past Surgical History:  Procedure Laterality Date  . BREAST LUMPECTOMY Left    lump remove from left arm  . CARPAL TUNNEL RELEASE Right 2010  . curved femur Left   . HAND SURGERY Right 1987   right thumb and index finger  . HERNIA REPAIR  2008   inguineal  . JOINT REPLACEMENT Left 2002   hip replacement  . MASTOIDECTOMY  1982  . radical prostectomy    . TOTAL HIP REVISION Left 04/24/2016   Procedure: TOTAL HIP REVISION;  Surgeon: [name], MD;  Location: [facility];  Service: Orthopedics;  Laterality: Left;    There were no vitals filed for this visit.      Subjective Assessment - 07/23/16 1138    Subjective Please see discharge summary.                                   PT Short Term Goals - 06/03/16 1357      PT SHORT TERM GOAL #1   Title Pt will be independent in his initial HEP.   Time 3   Period Weeks   Status Achieved           PT Long Term Goals - 06/03/16 1357      PT LONG TERM GOAL #1   Title Independent with a HEP progression   Time 12   Period Weeks   Status On-going     PT LONG TERM GOAL #2   Title Pt will be able to ambulate 500 feet with straight cane with equalized step length with no pain in left hip.    Baseline pain with short distance amb using a straight cane   Time 12   Period Weeks   Status On-going     PT LONG TERM GOAL #3   Title Pt will be able to improve left LE strength to 5/5 in hip flexion, abduction, adduction,  and extension in order to improve gait.    Time 12   Period Weeks   Status On-going     PT LONG TERM GOAL #4   Title Pt will improve SLS on the L LE to 10 seconds in order to improve safety with amb and gait   Time 12   Period Weeks   Status On-going             Patient will benefit from skilled therapeutic intervention in order to improve the following deficits and impairments:     Visit Diagnosis: Difficulty in walking, not elsewhere classified  Pain in left hip  Other abnormalities of gait and mobility     Problem List Patient Active Problem List   Diagnosis Date Noted  . 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  Remaining deficits: See goal section above.   Education / Equipment: Patient arrived late today.  We discussed his HEP and joining our self-directed gym program until he undergoes his right total knee replacement on 08/26/16. Plan: Patient agrees to discharge.  Patient goals were not met. Patient is being discharged due to                                                     ?????       Mahamadou Weltz, [place] MPT 07/23/2016, 11:47 AM  [facility] [place], [place], [address] Phone: [phone]   Fax:  [phone]  Name: VERNEL LANGENDERFER MRN: [id number] Date of Birth: Aug 03, 1945

## 2016-07-23 NOTE — Therapy (Deleted)
[facility] [place], [place], [address] Phone: [phone]   Fax:  [phone]  Jul 23, 2016   @CCLISTADDRESS @  Physical Therapy Discharge Summary  Patient: Jonathan Harvey  MRN: [id number]  Date of Birth: 06-07-1945   Diagnosis: Difficulty in walking, not elsewhere classified  Pain in left hip  Other abnormalities of gait and mobility Referring Provider: [name], MD  The above patient had been seen in Physical Therapy *** times of *** treatments scheduled with *** no shows and *** cancellations.  The treatment consisted of *** The patient is: {improved/worse/unchanged:3041574}  Subjective: ***  Discharge Findings: ***  Functional Status at Discharge: ***  {QGBEE:1007121}    Sincerely,   Peityn Payton, [place], PT   CC @[email or web address] @  [facility] [place], [place], [address] Phone: [phone]   Fax:  [phone]  Patient: Jonathan Harvey  MRN: [id number]  Date of Birth: 26-Mar-1945

## 2016-08-08 NOTE — H&P (Signed)
TOTAL KNEE ADMISSION H&P  Patient is being admitted for right total knee arthroplasty.  Subjective:  Chief Complaint:right knee pain.  HPI: Jonathan Harvey, 71 y.o. male, has a history of pain and functional disability in the right knee due to arthritis and has failed non-surgical conservative treatments for greater than 12 weeks to includeNSAID's and/or analgesics, corticosteriod injections, viscosupplementation injections, flexibility and strengthening excercises, supervised PT with diminished ADL's post treatment, use of assistive devices, weight reduction as appropriate and activity modification.  Onset of symptoms was gradual, starting 10 years ago with gradually worsening course since that time. The patient noted no past surgery on the right knee(s).  Patient currently rates pain in the right knee(s) at 10 out of 10 with activity. Patient has night pain, worsening of pain with activity and weight bearing, pain that interferes with activities of daily living, pain with passive range of motion, crepitus and joint swelling.  Patient has evidence of joint subluxation and joint space narrowing by imaging studies.   There is no active infection.  Patient Active Problem List   Diagnosis Date Noted  . 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 Arthritis   . Blood dyscrasia    prostectomy    . Hepatitis    Hepatitis B  2008  . History of kidney stones   . Hypertension     Past Surgical History:  Procedure Laterality Date  . BREAST LUMPECTOMY Left    lump remove from left arm  . CARPAL TUNNEL RELEASE Right 2010  . curved femur Left   . HAND SURGERY Right 1987   right thumb and index finger  . HERNIA REPAIR  2008   inguineal  . JOINT REPLACEMENT Left 2002   hip replacement  . MASTOIDECTOMY  1982  . radical prostectomy    . TOTAL HIP REVISION Left 04/24/2016   Procedure: TOTAL HIP REVISION;  Surgeon: [name], MD;  Location: [facility];   Service: Orthopedics;  Laterality: Left;    No prescriptions prior to admission.   Allergies  Allergen Reactions  . Lactose Intolerance (Gi) Other (See Comments)    Mild GI upset    Social History  Substance Use Topics  . Smoking status: Never Smoker  . Smokeless tobacco: Former Systems developer  . Alcohol use 8.4 oz/week    14 Cans of beer per week    No family history on file.   Review of Systems  Constitutional: Negative.   HENT: Positive for hearing loss.   Eyes:       Poor vision  Respiratory: Positive for shortness of breath.   Cardiovascular: Negative.   Gastrointestinal: Negative.   Genitourinary:       Hx of prostate cancer  Musculoskeletal: Positive for joint pain.  Skin: Negative.   Neurological: Negative.   Endo/Heme/Allergies: Negative.   Psychiatric/Behavioral: Negative.     Objective:  Physical Exam  Constitutional: He is oriented to person, place, and time. He appears well-developed and well-nourished.  HENT:  Head: Normocephalic and atraumatic.  Eyes: Pupils are equal, round, and reactive to light.  Neck: Normal range of motion. Neck supple.  Cardiovascular: Intact distal pulses.   Respiratory: Effort normal.  Musculoskeletal: He exhibits tenderness.  Range of motion is from 10-115.  Collateral ligament are 1+ lax to valgus stress.  Good quadriceps and hamstring power.    Neurological: He is alert and oriented to person, place, and time.  Skin: Skin is warm  and dry.  Psychiatric: He has a normal mood and affect. His behavior is normal. Judgment and thought content normal.    Vital signs in last 24 hours:    Labs:   Estimated body mass index is 30.03 kg/m as calculated from the following:   Height as of 04/24/16: 5\' 8"  (1.727 m).   Weight as of 04/24/16: 89.6 kg (197 lb 8 oz).   Imaging Review Plain radiographs demonstrate bone-on-bone arthritis lateral compartment, with valgus deformity.  Assessment/Plan:  End stage arthritis, right knee    The patient history, physical examination, clinical judgment of the provider and imaging studies are consistent with end stage degenerative joint disease of the right knee(s) and total knee arthroplasty is deemed medically necessary. The treatment options including medical management, injection therapy arthroscopy and arthroplasty were discussed at length. The risks and benefits of total knee arthroplasty were presented and reviewed. The risks due to aseptic loosening, infection, stiffness, patella tracking problems, thromboembolic complications and other imponderables were discussed. The patient acknowledged the explanation, agreed to proceed with the plan and consent was signed. Patient is being admitted for inpatient treatment for surgery, pain control, PT, OT, prophylactic antibiotics, VTE prophylaxis, progressive ambulation and ADL's and discharge planning. The patient is planning to be discharged home with home health services.

## 2016-08-15 ENCOUNTER — Encounter (HOSPITAL_COMMUNITY): Payer: Self-pay

## 2016-08-15 ENCOUNTER — Encounter (HOSPITAL_COMMUNITY)
Admission: RE | Admit: 2016-08-15 | Discharge: 2016-08-15 | Disposition: A | Discharge status: Home / Self Care | Payer: Medicare Other | Source: Ambulatory Visit | Attending: Orthopedic Surgery | Admitting: Orthopedic Surgery

## 2016-08-15 DIAGNOSIS — Z01812 Encounter for preprocedural laboratory examination: Secondary | ICD-10-CM | POA: Diagnosis not present

## 2016-08-15 HISTORY — DX: Malignant neoplasm of prostate: C61

## 2016-08-15 LAB — URINALYSIS, ROUTINE W REFLEX MICROSCOPIC
Bilirubin Urine: NEGATIVE
Glucose, UA: NEGATIVE mg/dL
Hgb urine dipstick: NEGATIVE
Ketones, ur: NEGATIVE mg/dL
LEUKOCYTES UA: NEGATIVE
NITRITE: NEGATIVE
Protein, ur: NEGATIVE mg/dL
SPECIFIC GRAVITY, URINE: 1.008 (ref 1.005–1.030)
pH: 7 (ref 5.0–8.0)

## 2016-08-15 LAB — CBC WITH DIFFERENTIAL/PLATELET
BASOS PCT: 0 %
Basophils Absolute: 0 10*3/uL (ref 0.0–0.1)
EOS ABS: 0.4 10*3/uL (ref 0.0–0.7)
EOS PCT: 7 %
HCT: 39.1 % (ref 39.0–52.0)
Hemoglobin: 13.3 g/dL (ref 13.0–17.0)
Lymphocytes Relative: 40 %
Lymphs Abs: 2.2 10*3/uL (ref 0.7–4.0)
MCH: 30.4 pg (ref 26.0–34.0)
MCHC: 34 g/dL (ref 30.0–36.0)
MCV: 89.3 fL (ref 78.0–100.0)
Monocytes Absolute: 0.5 10*3/uL (ref 0.1–1.0)
Monocytes Relative: 8 %
NEUTROS PCT: 45 %
Neutro Abs: 2.5 10*3/uL (ref 1.7–7.7)
PLATELETS: 328 10*3/uL (ref 150–400)
RBC: 4.38 MIL/uL (ref 4.22–5.81)
RDW: 14.3 % (ref 11.5–15.5)
WBC: 5.5 10*3/uL (ref 4.0–10.5)

## 2016-08-15 LAB — BASIC METABOLIC PANEL
Anion gap: 9 (ref 5–15)
BUN: 26 mg/dL — AB (ref 6–20)
CALCIUM: 9.9 mg/dL (ref 8.9–10.3)
CHLORIDE: 98 mmol/L — AB (ref 101–111)
CO2: 31 mmol/L (ref 22–32)
Creatinine, Ser: 1.39 mg/dL — ABNORMAL HIGH (ref 0.61–1.24)
GFR, EST AFRICAN AMERICAN: 59 mL/min — AB (ref >60)
GFR, EST NON AFRICAN AMERICAN: 51 mL/min — AB (ref >60)
Glucose, Bld: 99 mg/dL (ref 65–99)
Potassium: 3.1 mmol/L — ABNORMAL LOW (ref 3.5–5.1)
SODIUM: 138 mmol/L (ref 135–145)

## 2016-08-15 LAB — TYPE AND SCREEN
ABO/RH(D): A POS
Antibody Screen: NEGATIVE

## 2016-08-15 LAB — PROTIME-INR
INR: 1
PROTHROMBIN TIME: 13.2 s (ref 11.4–15.2)

## 2016-08-15 LAB — SURGICAL PCR SCREEN
MRSA, PCR: NEGATIVE
STAPHYLOCOCCUS AUREUS: NEGATIVE

## 2016-08-15 LAB — APTT: APTT: 32 s (ref 24–36)

## 2016-08-15 NOTE — Pre-Procedure Instructions (Signed)
    KIVON APREA  08/15/2016      [facility] [place] [address] [place] [address] Phone: [phone] Fax: [phone]    Your procedure is scheduled on 08/26/16.  Report to [facility] Admitting at 1045 A.M.  Call this number if you have problems the morning of surgery:  [phone]   Remember:  Do not eat food or drink liquids after midnight.  Take these medicines the morning of surgery with A SIP OF WATEr -- neurontin,metoprolol,oxycodone,ultram   Do not wear jewelry, make-up or nail polish.  Do not wear lotions, powders, or perfumes, or deoderant.  Do not shave 48 hours prior to surgery.  Men may shave face and neck.  Do not bring valuables to the hospital.  [facility] is not responsible for any belongings or valuables.  Contacts, dentures or bridgework may not be worn into surgery.  Leave your suitcase in the car.  After surgery it may be brought to your room.  For patients admitted to the hospital, discharge time will be determined by your treatment team.  Patients discharged the day of surgery will not be allowed to drive home.   Name and phone number of your driver:    Special instructions:  Do not take any aspirin,anti-inflammatories,vitamins,or herbal supplements 5-7 days prior to surgery.  Please read over the following fact sheets that you were given. MRSA Information

## 2016-08-22 ENCOUNTER — Encounter (HOSPITAL_COMMUNITY): Payer: Self-pay

## 2016-08-22 NOTE — Progress Notes (Addendum)
Anesthesia Chart Review: Patient is a 71 year old male scheduled for right TKA on 08/26/16 by Dr. Mayer Camel.  History includes never smoker, HTN, hepatitis B '08, nephrolithiasis, arthritis,  left THA 08/29/99 with revision 04/24/16, prostate cancer s/p robotic prostatectomy 10/2006.   PCP is listed as Dr. Windell [place].  Meds include ASA 81 mg, Zyrtec-D, Lasix, Neurontin, losartan-HCTZ, Toprol XL, Zocor, tramadol.   BP 120/67   Pulse 84   Temp 36.5 C   Resp 18   Ht 5' 8.5" (1.74 m)   Wt 191 lb 1.6 oz (86.7 kg)   SpO2 94%   BMI 28.63 kg/m   EKG 04/18/16: NSR, right BBB. No significant change since last tracing 08/22/99 (see Muse).  CXR 04/24/16: IMPRESSION: No radiographic evidence of acute cardiopulmonary disease. Asymmetric elevation the right hemidiaphragm, uncertain etiology and chronicity, with no comparison available.  Preoperative labs noted. BUN 26, Cr 1.39 (previously 17/1.20). K 3.1. CBC, PT/PTT, UA WNL. Dr. Mayer Camel requested K+ be repeated, so patient is getting repeat labs on 08/23/16 (CMET entered due to recorded history of hepatitis B).  George Hugh [facility] Short Stay Center/Anesthesiology Phone [phone] 08/22/2016 3:57 PM  Addendum: CMET from today showed normal K+ of 3.8. Cr stable at 1.40. LFTs WNL. I notified Juliann Pulse at Dr. Damita Dunnings office.   George Hugh [facility] Short Stay Center/Anesthesiology Phone [phone] 08/23/2016 10:58 AM

## 2016-08-23 ENCOUNTER — Encounter (HOSPITAL_COMMUNITY)
Admission: RE | Admit: 2016-08-23 | Discharge: 2016-08-23 | Disposition: A | Discharge status: Home / Self Care | Payer: Medicare Other | Source: Ambulatory Visit | Attending: Orthopedic Surgery | Admitting: Orthopedic Surgery

## 2016-08-23 LAB — COMPREHENSIVE METABOLIC PANEL
ALK PHOS: 97 U/L (ref 38–126)
ALT: 21 U/L (ref 17–63)
AST: 28 U/L (ref 15–41)
Albumin: 3.7 g/dL (ref 3.5–5.0)
Anion gap: 7 (ref 5–15)
BUN: 28 mg/dL — AB (ref 6–20)
CALCIUM: 9.5 mg/dL (ref 8.9–10.3)
CHLORIDE: 102 mmol/L (ref 101–111)
CO2: 31 mmol/L (ref 22–32)
CREATININE: 1.4 mg/dL — AB (ref 0.61–1.24)
GFR, EST AFRICAN AMERICAN: 58 mL/min — AB (ref >60)
GFR, EST NON AFRICAN AMERICAN: 50 mL/min — AB (ref >60)
Glucose, Bld: 127 mg/dL — ABNORMAL HIGH (ref 65–99)
Potassium: 3.8 mmol/L (ref 3.5–5.1)
SODIUM: 140 mmol/L (ref 135–145)
Total Bilirubin: 0.5 mg/dL (ref 0.3–1.2)
Total Protein: 6.7 g/dL (ref 6.5–8.1)

## 2016-08-23 MED ORDER — TRANEXAMIC ACID 1000 MG/10ML IV SOLN
2000.0000 mg | INTRAVENOUS | Status: AC
  Administered 2016-08-26: 2000 mg via TOPICAL
  Filled 2016-08-23: qty 20

## 2016-08-23 MED ORDER — CEFAZOLIN SODIUM-DEXTROSE 2-4 GM/100ML-% IV SOLN
2.0000 g | INTRAVENOUS | Status: AC
  Administered 2016-08-26: 2 g via INTRAVENOUS
  Filled 2016-08-23: qty 100

## 2016-08-23 MED ORDER — DEXTROSE-NACL 5-0.45 % IV SOLN: INTRAVENOUS | Status: DC

## 2016-08-25 DIAGNOSIS — M1711 Unilateral primary osteoarthritis, right knee: Secondary | ICD-10-CM | POA: Diagnosis present

## 2016-08-26 ENCOUNTER — Encounter (HOSPITAL_COMMUNITY): Payer: Self-pay | Admitting: *Deleted

## 2016-08-26 ENCOUNTER — Encounter (HOSPITAL_COMMUNITY)
Admission: RE | Disposition: A | Discharge status: Home Health | Payer: Self-pay | Source: Ambulatory Visit | Attending: Orthopedic Surgery

## 2016-08-26 ENCOUNTER — Inpatient Hospital Stay (HOSPITAL_COMMUNITY)
Admission: RE | Admit: 2016-08-26 | Discharge: 2016-08-28 | DRG: 470 | Disposition: A | Discharge status: Home Health | Payer: Medicare Other | Source: Ambulatory Visit | Attending: Orthopedic Surgery | Admitting: Orthopedic Surgery

## 2016-08-26 ENCOUNTER — Inpatient Hospital Stay (HOSPITAL_COMMUNITY): Payer: Medicare Other | Admitting: Emergency Medicine

## 2016-08-26 ENCOUNTER — Inpatient Hospital Stay (HOSPITAL_COMMUNITY): Payer: Medicare Other | Admitting: Vascular Surgery

## 2016-08-26 DIAGNOSIS — Z87891 Personal history of nicotine dependence: Secondary | ICD-10-CM | POA: Diagnosis not present

## 2016-08-26 DIAGNOSIS — I10 Essential (primary) hypertension: Secondary | ICD-10-CM | POA: Diagnosis present

## 2016-08-26 DIAGNOSIS — D62 Acute posthemorrhagic anemia: Secondary | ICD-10-CM | POA: Diagnosis not present

## 2016-08-26 DIAGNOSIS — Z87442 Personal history of urinary calculi: Secondary | ICD-10-CM

## 2016-08-26 DIAGNOSIS — M25561 Pain in right knee: Secondary | ICD-10-CM | POA: Diagnosis present

## 2016-08-26 DIAGNOSIS — M4800 Spinal stenosis, site unspecified: Secondary | ICD-10-CM | POA: Diagnosis present

## 2016-08-26 DIAGNOSIS — M1711 Unilateral primary osteoarthritis, right knee: Secondary | ICD-10-CM | POA: Diagnosis present

## 2016-08-26 HISTORY — PX: TOTAL KNEE ARTHROPLASTY: SHX125

## 2016-08-26 SURGERY — ARTHROPLASTY, KNEE, TOTAL
Anesthesia: General | Site: Knee | Laterality: Right

## 2016-08-26 MED ORDER — PHENOL 1.4 % MT LIQD: 1.0000 | OROMUCOSAL | Status: DC | PRN

## 2016-08-26 MED ORDER — SODIUM CHLORIDE 0.9 % IJ SOLN
INTRAMUSCULAR | Status: DC | PRN
  Administered 2016-08-26: 50 mL via INTRAVENOUS

## 2016-08-26 MED ORDER — FENTANYL CITRATE (PF) 250 MCG/5ML IJ SOLN
INTRAMUSCULAR | Status: AC
  Filled 2016-08-26: qty 5

## 2016-08-26 MED ORDER — METOCLOPRAMIDE HCL 5 MG PO TABS: 5.0000 mg | ORAL_TABLET | Freq: Three times a day (TID) | ORAL | Status: DC | PRN

## 2016-08-26 MED ORDER — PROPOFOL 10 MG/ML IV BOLUS
INTRAVENOUS | Status: DC | PRN
  Administered 2016-08-26: 150 mg via INTRAVENOUS

## 2016-08-26 MED ORDER — LORATADINE 10 MG PO TABS
10.0000 mg | ORAL_TABLET | Freq: Every day | ORAL | Status: DC
  Administered 2016-08-27 – 2016-08-28 (×2): 10 mg via ORAL
  Filled 2016-08-26 (×3): qty 1

## 2016-08-26 MED ORDER — BISACODYL 5 MG PO TBEC: 5.0000 mg | DELAYED_RELEASE_TABLET | Freq: Every day | ORAL | Status: DC | PRN

## 2016-08-26 MED ORDER — HYDROMORPHONE HCL 1 MG/ML IJ SOLN: 0.5000 mg | INTRAMUSCULAR | Status: DC | PRN

## 2016-08-26 MED ORDER — 0.9 % SODIUM CHLORIDE (POUR BTL) OPTIME
TOPICAL | Status: DC | PRN
  Administered 2016-08-26: 1000 mL

## 2016-08-26 MED ORDER — GABAPENTIN 300 MG PO CAPS
300.0000 mg | ORAL_CAPSULE | ORAL | Status: DC
  Administered 2016-08-26 – 2016-08-28 (×6): 300 mg via ORAL
  Filled 2016-08-26 (×6): qty 1

## 2016-08-26 MED ORDER — HYDROMORPHONE HCL 1 MG/ML IJ SOLN
0.2500 mg | INTRAMUSCULAR | Status: DC | PRN
  Administered 2016-08-26 (×2): 0.5 mg via INTRAVENOUS

## 2016-08-26 MED ORDER — DOCUSATE SODIUM 100 MG PO CAPS
100.0000 mg | ORAL_CAPSULE | Freq: Two times a day (BID) | ORAL | Status: DC
  Administered 2016-08-26 – 2016-08-28 (×4): 100 mg via ORAL
  Filled 2016-08-26 (×5): qty 1

## 2016-08-26 MED ORDER — DEXAMETHASONE SODIUM PHOSPHATE 10 MG/ML IJ SOLN
INTRAMUSCULAR | Status: DC | PRN
  Administered 2016-08-26: 10 mg via INTRAVENOUS

## 2016-08-26 MED ORDER — BUPIVACAINE-EPINEPHRINE (PF) 0.25% -1:200000 IJ SOLN
INTRAMUSCULAR | Status: DC | PRN
  Administered 2016-08-26: 50 mL

## 2016-08-26 MED ORDER — SODIUM CHLORIDE 0.9 % IV SOLN
1000.0000 mg | INTRAVENOUS | Status: AC
  Administered 2016-08-26: 1000 mg via INTRAVENOUS
  Filled 2016-08-26: qty 10

## 2016-08-26 MED ORDER — FENTANYL CITRATE (PF) 100 MCG/2ML IJ SOLN
INTRAMUSCULAR | Status: DC | PRN
  Administered 2016-08-26 (×4): 25 ug via INTRAVENOUS
  Administered 2016-08-26: 50 ug via INTRAVENOUS
  Administered 2016-08-26 (×4): 25 ug via INTRAVENOUS

## 2016-08-26 MED ORDER — ALUM & MAG HYDROXIDE-SIMETH 200-200-20 MG/5ML PO SUSP: 30.0000 mL | ORAL | Status: DC | PRN

## 2016-08-26 MED ORDER — BUPIVACAINE-EPINEPHRINE (PF) 0.25% -1:200000 IJ SOLN
INTRAMUSCULAR | Status: AC
  Filled 2016-08-26: qty 60

## 2016-08-26 MED ORDER — LACTATED RINGERS IV SOLN
INTRAVENOUS | Status: DC
  Administered 2016-08-26 (×2): via INTRAVENOUS

## 2016-08-26 MED ORDER — TIZANIDINE HCL 2 MG PO TABS: 2.0000 mg | ORAL_TABLET | Freq: Four times a day (QID) | ORAL | 0 refills | Status: DC | PRN

## 2016-08-26 MED ORDER — ACETAMINOPHEN 650 MG RE SUPP: 650.0000 mg | Freq: Four times a day (QID) | RECTAL | Status: DC | PRN

## 2016-08-26 MED ORDER — SENNOSIDES-DOCUSATE SODIUM 8.6-50 MG PO TABS: 1.0000 | ORAL_TABLET | Freq: Every evening | ORAL | Status: DC | PRN

## 2016-08-26 MED ORDER — DIPHENHYDRAMINE HCL 12.5 MG/5ML PO ELIX: 12.5000 mg | ORAL_SOLUTION | ORAL | Status: DC | PRN

## 2016-08-26 MED ORDER — OXYCODONE-ACETAMINOPHEN 5-325 MG PO TABS: 1.0000 | ORAL_TABLET | ORAL | 0 refills | Status: DC | PRN

## 2016-08-26 MED ORDER — HYDROCHLOROTHIAZIDE 25 MG PO TABS
25.0000 mg | ORAL_TABLET | Freq: Every day | ORAL | Status: DC
  Administered 2016-08-27 – 2016-08-28 (×2): 25 mg via ORAL
  Filled 2016-08-26 (×2): qty 1

## 2016-08-26 MED ORDER — METHOCARBAMOL 1000 MG/10ML IJ SOLN
500.0000 mg | Freq: Four times a day (QID) | INTRAVENOUS | Status: DC | PRN
  Filled 2016-08-26: qty 5

## 2016-08-26 MED ORDER — FUROSEMIDE 20 MG PO TABS
20.0000 mg | ORAL_TABLET | Freq: Every day | ORAL | Status: DC
  Administered 2016-08-27 – 2016-08-28 (×2): 20 mg via ORAL
  Filled 2016-08-26 (×2): qty 1

## 2016-08-26 MED ORDER — ASPIRIN EC 325 MG PO TBEC: 325.0000 mg | DELAYED_RELEASE_TABLET | Freq: Two times a day (BID) | ORAL | 0 refills | Status: DC

## 2016-08-26 MED ORDER — ONDANSETRON HCL 4 MG/2ML IJ SOLN: 4.0000 mg | Freq: Four times a day (QID) | INTRAMUSCULAR | Status: DC | PRN

## 2016-08-26 MED ORDER — FENTANYL CITRATE (PF) 100 MCG/2ML IJ SOLN
50.0000 ug | Freq: Once | INTRAMUSCULAR | Status: AC
  Administered 2016-08-26: 50 ug via INTRAVENOUS

## 2016-08-26 MED ORDER — CHLORHEXIDINE GLUCONATE 4 % EX LIQD: 60.0000 mL | Freq: Once | CUTANEOUS | Status: DC

## 2016-08-26 MED ORDER — LIDOCAINE 2% (20 MG/ML) 5 ML SYRINGE
INTRAMUSCULAR | Status: DC | PRN
  Administered 2016-08-26: 60 mg via INTRAVENOUS

## 2016-08-26 MED ORDER — ONDANSETRON HCL 4 MG/2ML IJ SOLN
INTRAMUSCULAR | Status: DC | PRN
  Administered 2016-08-26: 4 mg via INTRAVENOUS

## 2016-08-26 MED ORDER — BUPIVACAINE-EPINEPHRINE (PF) 0.5% -1:200000 IJ SOLN
INTRAMUSCULAR | Status: DC | PRN
  Administered 2016-08-26: 17 mL via PERINEURAL

## 2016-08-26 MED ORDER — FLEET ENEMA 7-19 GM/118ML RE ENEM: 1.0000 | ENEMA | Freq: Once | RECTAL | Status: DC | PRN

## 2016-08-26 MED ORDER — SODIUM CHLORIDE 0.9 % IR SOLN
Status: DC | PRN
  Administered 2016-08-26: 3000 mL

## 2016-08-26 MED ORDER — METHOCARBAMOL 500 MG PO TABS
500.0000 mg | ORAL_TABLET | Freq: Four times a day (QID) | ORAL | Status: DC | PRN
  Administered 2016-08-27 – 2016-08-28 (×3): 500 mg via ORAL
  Filled 2016-08-26 (×4): qty 1

## 2016-08-26 MED ORDER — METOCLOPRAMIDE HCL 5 MG/ML IJ SOLN: 5.0000 mg | Freq: Three times a day (TID) | INTRAMUSCULAR | Status: DC | PRN

## 2016-08-26 MED ORDER — MIDAZOLAM HCL 5 MG/5ML IJ SOLN
INTRAMUSCULAR | Status: DC | PRN
  Administered 2016-08-26 (×2): 0.5 mg via INTRAVENOUS
  Administered 2016-08-26: 1 mg via INTRAVENOUS

## 2016-08-26 MED ORDER — HYDROMORPHONE HCL 1 MG/ML IJ SOLN
INTRAMUSCULAR | Status: AC
  Filled 2016-08-26: qty 0.5

## 2016-08-26 MED ORDER — LOSARTAN POTASSIUM 50 MG PO TABS
100.0000 mg | ORAL_TABLET | Freq: Every day | ORAL | Status: DC
  Administered 2016-08-27 – 2016-08-28 (×2): 100 mg via ORAL
  Filled 2016-08-26 (×2): qty 2

## 2016-08-26 MED ORDER — MENTHOL 3 MG MT LOZG: 1.0000 | LOZENGE | OROMUCOSAL | Status: DC | PRN

## 2016-08-26 MED ORDER — ONDANSETRON HCL 4 MG PO TABS: 4.0000 mg | ORAL_TABLET | Freq: Four times a day (QID) | ORAL | Status: DC | PRN

## 2016-08-26 MED ORDER — LOSARTAN POTASSIUM-HCTZ 100-25 MG PO TABS: 1.0000 | ORAL_TABLET | Freq: Every day | ORAL | Status: DC

## 2016-08-26 MED ORDER — PHENYLEPHRINE HCL 10 MG/ML IJ SOLN
INTRAMUSCULAR | Status: DC | PRN
  Administered 2016-08-26: 50 ug/min via INTRAVENOUS

## 2016-08-26 MED ORDER — CELECOXIB 200 MG PO CAPS
200.0000 mg | ORAL_CAPSULE | Freq: Two times a day (BID) | ORAL | Status: DC
  Administered 2016-08-26 – 2016-08-28 (×4): 200 mg via ORAL
  Filled 2016-08-26 (×4): qty 1

## 2016-08-26 MED ORDER — FENTANYL CITRATE (PF) 100 MCG/2ML IJ SOLN
INTRAMUSCULAR | Status: AC
  Administered 2016-08-26: 50 ug via INTRAVENOUS
  Filled 2016-08-26: qty 2

## 2016-08-26 MED ORDER — KCL IN DEXTROSE-NACL 20-5-0.45 MEQ/L-%-% IV SOLN
INTRAVENOUS | Status: DC
  Administered 2016-08-26: 18:00:00 via INTRAVENOUS
  Filled 2016-08-26 (×4): qty 1000

## 2016-08-26 MED ORDER — HYDROCORTISONE 2.5 % RE CREA
1.0000 "application " | TOPICAL_CREAM | Freq: Every day | RECTAL | Status: DC
  Filled 2016-08-26: qty 28.35

## 2016-08-26 MED ORDER — METOPROLOL SUCCINATE ER 25 MG PO TB24
25.0000 mg | ORAL_TABLET | Freq: Every day | ORAL | Status: DC
  Administered 2016-08-27 – 2016-08-28 (×2): 25 mg via ORAL
  Filled 2016-08-26 (×2): qty 1

## 2016-08-26 MED ORDER — MIDAZOLAM HCL 2 MG/2ML IJ SOLN
INTRAMUSCULAR | Status: AC
  Administered 2016-08-26: 2 mg via INTRAVENOUS
  Filled 2016-08-26: qty 2

## 2016-08-26 MED ORDER — CEFUROXIME SODIUM 750 MG IJ SOLR
INTRAMUSCULAR | Status: DC | PRN
  Administered 2016-08-26: 1.5 g

## 2016-08-26 MED ORDER — OXYCODONE HCL 5 MG PO TABS
5.0000 mg | ORAL_TABLET | ORAL | Status: DC | PRN
  Administered 2016-08-26 – 2016-08-28 (×7): 10 mg via ORAL
  Filled 2016-08-26 (×7): qty 2

## 2016-08-26 MED ORDER — OXYCODONE HCL 5 MG/5ML PO SOLN: 5.0000 mg | Freq: Once | ORAL | Status: DC | PRN

## 2016-08-26 MED ORDER — OXYCODONE HCL 5 MG PO TABS: 5.0000 mg | ORAL_TABLET | Freq: Once | ORAL | Status: DC | PRN

## 2016-08-26 MED ORDER — MIDAZOLAM HCL 2 MG/2ML IJ SOLN
2.0000 mg | Freq: Once | INTRAMUSCULAR | Status: AC
  Administered 2016-08-26: 2 mg via INTRAVENOUS

## 2016-08-26 MED ORDER — ACETAMINOPHEN 325 MG PO TABS
650.0000 mg | ORAL_TABLET | Freq: Four times a day (QID) | ORAL | Status: DC | PRN
  Administered 2016-08-26: 650 mg via ORAL
  Filled 2016-08-26: qty 2

## 2016-08-26 MED ORDER — MIDAZOLAM HCL 2 MG/2ML IJ SOLN
INTRAMUSCULAR | Status: AC
  Filled 2016-08-26: qty 2

## 2016-08-26 MED ORDER — BUPIVACAINE LIPOSOME 1.3 % IJ SUSP
20.0000 mL | Freq: Once | INTRAMUSCULAR | Status: AC
  Administered 2016-08-26: 20 mL
  Filled 2016-08-26: qty 20

## 2016-08-26 MED ORDER — ASPIRIN EC 325 MG PO TBEC
325.0000 mg | DELAYED_RELEASE_TABLET | Freq: Every day | ORAL | Status: DC
  Administered 2016-08-27 – 2016-08-28 (×2): 325 mg via ORAL
  Filled 2016-08-26 (×2): qty 1

## 2016-08-26 SURGICAL SUPPLY — 54 items
BANDAGE ESMARK 6X9 LF (GAUZE/BANDAGES/DRESSINGS) ×1 IMPLANT
BLADE SAG 18X100X1.27 (BLADE) ×5 IMPLANT
BLADE SAGITTAL 13X1.27X60 (BLADE) ×1 IMPLANT
BLADE SAGITTAL 13X1.27X60MM (BLADE) ×1
BLADE SAW SGTL 13X75X1.27 (BLADE) IMPLANT
BNDG CMPR 9X6 STRL LF SNTH (GAUZE/BANDAGES/DRESSINGS) ×1
BNDG CMPR MED 10X6 ELC LF (GAUZE/BANDAGES/DRESSINGS) ×1
BNDG ELASTIC 6X10 VLCR STRL LF (GAUZE/BANDAGES/DRESSINGS) ×3 IMPLANT
BNDG ESMARK 6X9 LF (GAUZE/BANDAGES/DRESSINGS) ×3
BOWL SMART MIX CTS (DISPOSABLE) ×3 IMPLANT
CAPT KNEE TOTAL 3 ATTUNE ×2 IMPLANT
CEMENT HV SMART SET (Cement) ×6 IMPLANT
COVER SURGICAL LIGHT HANDLE (MISCELLANEOUS) ×3 IMPLANT
CUFF TOURNIQUET SINGLE 34IN LL (TOURNIQUET CUFF) ×3 IMPLANT
CUFF TOURNIQUET SINGLE 44IN (TOURNIQUET CUFF) IMPLANT
DRAPE EXTREMITY T 121X128X90 (DRAPE) ×3 IMPLANT
DRAPE U-SHAPE 47X51 STRL (DRAPES) ×3 IMPLANT
DRSG AQUACEL AG ADV 3.5X10 (GAUZE/BANDAGES/DRESSINGS) ×3 IMPLANT
DURAPREP 26ML APPLICATOR (WOUND CARE) ×3 IMPLANT
ELECT REM PT RETURN 9FT ADLT (ELECTROSURGICAL) ×3
ELECTRODE REM PT RTRN 9FT ADLT (ELECTROSURGICAL) ×1 IMPLANT
GLOVE BIO SURGEON STRL SZ7.5 (GLOVE) ×3 IMPLANT
GLOVE BIO SURGEON STRL SZ8.5 (GLOVE) ×3 IMPLANT
GLOVE BIOGEL PI IND STRL 8 (GLOVE) ×1 IMPLANT
GLOVE BIOGEL PI IND STRL 9 (GLOVE) ×1 IMPLANT
GLOVE BIOGEL PI INDICATOR 8 (GLOVE) ×2
GLOVE BIOGEL PI INDICATOR 9 (GLOVE) ×2
GOWN STRL REUS W/ TWL LRG LVL3 (GOWN DISPOSABLE) ×1 IMPLANT
GOWN STRL REUS W/ TWL XL LVL3 (GOWN DISPOSABLE) ×2 IMPLANT
GOWN STRL REUS W/TWL LRG LVL3 (GOWN DISPOSABLE) ×3
GOWN STRL REUS W/TWL XL LVL3 (GOWN DISPOSABLE) ×6
HANDPIECE INTERPULSE COAX TIP (DISPOSABLE) ×3
HOOD PEEL AWAY FACE SHEILD DIS (HOOD) ×8 IMPLANT
IMMOBILIZER KNEE 22 UNIV (SOFTGOODS) ×2 IMPLANT
KIT BASIN OR (CUSTOM PROCEDURE TRAY) ×3 IMPLANT
KIT ROOM TURNOVER OR (KITS) ×3 IMPLANT
MANIFOLD NEPTUNE II (INSTRUMENTS) ×3 IMPLANT
NEEDLE 22X1 1/2 (OR ONLY) (NEEDLE) ×6 IMPLANT
NS IRRIG 1000ML POUR BTL (IV SOLUTION) ×3 IMPLANT
PACK TOTAL JOINT (CUSTOM PROCEDURE TRAY) ×3 IMPLANT
PAD ARMBOARD 7.5X6 YLW CONV (MISCELLANEOUS) ×6 IMPLANT
SET HNDPC FAN SPRY TIP SCT (DISPOSABLE) ×1 IMPLANT
SUT VIC AB 0 CT1 27 (SUTURE) ×3
SUT VIC AB 0 CT1 27XBRD ANBCTR (SUTURE) ×1 IMPLANT
SUT VIC AB 1 CTX 36 (SUTURE) ×3
SUT VIC AB 1 CTX36XBRD ANBCTR (SUTURE) ×1 IMPLANT
SUT VIC AB 2-0 CT1 27 (SUTURE) ×3
SUT VIC AB 2-0 CT1 TAPERPNT 27 (SUTURE) ×1 IMPLANT
SUT VIC AB 3-0 CT1 27 (SUTURE) ×3
SUT VIC AB 3-0 CT1 TAPERPNT 27 (SUTURE) ×1 IMPLANT
SYR CONTROL 10ML LL (SYRINGE) ×6 IMPLANT
TOWEL OR 17X24 6PK STRL BLUE (TOWEL DISPOSABLE) ×3 IMPLANT
TOWEL OR 17X26 10 PK STRL BLUE (TOWEL DISPOSABLE) ×3 IMPLANT
TRAY CATH 16FR W/PLASTIC CATH (SET/KITS/TRAYS/PACK) IMPLANT

## 2016-08-26 NOTE — Anesthesia Procedure Notes (Signed)
Anesthesia Regional Block: Adductor canal block   Pre-Anesthetic Checklist: ,, timeout performed, Correct Patient, Correct Site, Correct Laterality, Correct Procedure, Correct Position, site marked, Risks and benefits discussed,  Surgical consent,  Pre-op evaluation,  At surgeon's request and post-op pain management  Laterality: Lower and Right  Prep: chloraprep       Needles:  Injection technique: Single-shot  Needle Type: Echogenic Stimulator Needle          Additional Needles:   Procedures: ultrasound guided,,,,,,,,  Narrative:  Start time: 08/26/2016 10:49 AM End time: 08/26/2016 10:54 AM Injection made incrementally with aspirations every 5 mL.  Performed by: Personally  Anesthesiologist: Marri Mcneff  Additional Notes: H+P and labs reviewed, risks and benefits discussed with patient, procedure tolerated well without complications

## 2016-08-26 NOTE — Op Note (Signed)
PATIENT ID:      Jonathan Harvey  MRN:     [id number] DOB/AGE:    December 02, 1945 / 71 y.o.       OPERATIVE REPORT    DATE OF PROCEDURE:  08/26/2016       PREOPERATIVE DIAGNOSIS:   RIGHT KNEE OSTEOARTHRITIS      Estimated body mass index is 28.62 kg/m as calculated from the following:   Height as of 08/15/16: 5' 8.5" (1.74 m).   Weight as of this encounter: 86.6 kg (191 lb).                                                        POSTOPERATIVE DIAGNOSIS:   RIGHT KNEE OSTEOARTHRITIS                                                                      PROCEDURE:  Procedure(s): RIGHT TOTAL KNEE ARTHROPLASTY Using DepuyAttune RP implants #7R Femur, #8Tibia, 5 mm Attune RP bearing, 41 Patella     SURGEON: Freida Nebel J    ASSISTANT:   [employer]   (Present and scrubbed throughout the case, critical for assistance with exposure, retraction, instrumentation, and closure.)         ANESTHESIA: GET, 20cc Exparel, 50cc 0.25% Marcaine  EBL: 400  FLUID REPLACEMENT: 1600 crystalloid  TOURNIQUET TIME: 18min  Drains: None  Tranexamic Acid: 1gm IV, 2gm topical  COMPLICATIONS:  None         INDICATIONS FOR PROCEDURE: The patient has  RIGHT KNEE OSTEOARTHRITIS, Val deformities, XR shows bone on bone arthritis, lateral subluxation of tibia. Patient has failed all conservative measures including anti-inflammatory medicines, narcotics, attempts at  exercise and weight loss, cortisone injections and viscosupplementation.  Risks and benefits of surgery have been discussed, questions answered.   DESCRIPTION OF PROCEDURE: The patient identified by armband, received  IV antibiotics, in the holding area at [facility]. Patient taken to the operating room, appropriate anesthetic  monitors were attached, and GET anesthesia was  induced. Tourniquet  applied high to the operative thigh. Lateral post and foot positioner  applied to the table, the lower extremity was then prepped and draped  in usual  sterile fashion from the toes to the tourniquet. Time-out procedure was performed. We began the operation, with the knee flexed 120 degrees, by making the anterior midline incision starting at handbreadth above the patella going over the patella 1 cm medial to and 4 cm distal to the tibial tubercle. Small bleeders in the skin and the  subcutaneous tissue identified and cauterized. Transverse retinaculum was incised and reflected medially and a medial parapatellar arthrotomy was accomplished. the patella was everted and theprepatellar fat pad resected. The superficial medial collateral  ligament was then elevated from anterior to posterior along the proximal  flare of the tibia and anterior half of the menisci resected. The knee was hyperflexed exposing bone on bone arthritis. Peripheral and notch osteophytes as well as the cruciate ligaments were then resected. We continued to  work our way around posteriorly along the proximal tibia,  and externally  rotated the tibia subluxing it out from underneath the femur. A McHale  retractor was placed through the notch and a lateral Hohmann retractor  placed, and we then placed an extramedullary guide rod and 3-degree  posterior slope cutting guide. The tibial cutting guide, 3 degree posterior sloped, was pinned into place allowing resection of 10 mm of bone medially and 0 mm of bone laterally. Satisfied with the tibial resection, we then  entered the distal femur 2 mm anterior to the PCL origin with the  intramedullary guide rod and applied the distal femoral cutting guide  set at 12 mm, with 5 degrees of valgus. This was pinned along the  epicondylar axis. At this point, the distal femoral cut was accomplished without difficulty. We then sized for a #&r femoral component and pinned the guide in 0 degrees of external rotation. The chamfer cutting guide was pinned into place. The anterior, posterior, and chamfer cuts were accomplished without difficulty followed  by  the Attune RP box cutting guide and the box cut. We also removed posterior osteophytes from the posterior femoral condyles. At this  time, the knee was brought into full extension. We checked our  extension and flexion gaps and found them symmetric for a 5 mm bearing. Distracting in extension with a lamina spreader, the posterior horns of the menisci were removed, and Exparel, diluted to 60 cc, with 20cc NS, and 20cc 0.5% Marcaine,was injected into the capsule and synovium of the knee. The posterior patella cut was accomplished with the 9.5 mm Attune cutting guide, sized for a 37mm dome, and the fixation pegs drilled.The knee  was then once again hyperflexed exposing the proximal tibia. We sized for a # 8 tibial base plate, applied the smokestack and the conical reamer followed by the the Delta fin keel punch. We then hammered into place the Attune RP trial femoral component, drilled the lugs, inserted a  5 mm trial bearing, trial patellar button, and took the knee through range of motion from 0-130 degrees. No thumb pressure was required for patellar Tracking. At this point, the limb was wrapped with an Esmarch bandage and the tourniquet inflated to 350 mmHg. All trial components were removed, mating surfaces irrigated with pulse lavage, and dried with suction and sponges. 10 cc of the Exparel solution was applied to the cancellus bone of the patella distal femur and proximal tibia.  After waiting 1 minute, the bony surfaces were again, dried with sponges. A double batch of DePuy HV cement with 1500 mg of Zinacef was mixed and applied to all bony metallic mating surfaces except for the posterior condyles of the femur itself. In order, we hammered into place the tibial tray and removed excess cement, the femoral component and removed excess cement. The final Attune RP bearing  was inserted, and the knee brought to full extension with compression.  The patellar button was clamped into place, and excess  cement  removed. While the cement cured the wound was irrigated out with normal saline solution pulse lavage. Ligament stability and patellar tracking were checked and found to be excellent. The parapatellar arthrotomy was closed with  running #1 Vicryl suture. The subcutaneous tissue with 0 and 2-0 undyed  Vicryl suture, and the skin with running 3-0 SQ vicryl. A dressing of Xeroform,  4 x 4, dressing sponges, Webril, and Ace wrap applied. The patient  awakened, and taken to recovery room without difficulty.   [name] J 08/26/2016, 1:33 PM

## 2016-08-26 NOTE — Transfer of Care (Signed)
Immediate Anesthesia Transfer of Care Note  Patient: Jonathan Harvey  Procedure(s) Performed: Procedure(s): RIGHT TOTAL KNEE ARTHROPLASTY (Right)  Patient Location: PACU  Anesthesia Type:GA combined with regional for post-op pain  Level of Consciousness: awake, alert , oriented and patient cooperative  Airway & Oxygen Therapy: Patient Spontanous Breathing and Patient connected to nasal cannula oxygen  Post-op Assessment: Report given to RN and Post -op Vital signs reviewed and stable  Post vital signs: Reviewed and stable  Last Vitals:  Vitals:   08/26/16 1029  BP: 119/77  Pulse: 78  Resp: 18  Temp: 36.7 C    Last Pain:  Vitals:   08/26/16 1039  PainSc: 5       Patients Stated Pain Goal: 4 (58/59/29 2446)  Complications: No apparent anesthesia complications

## 2016-08-26 NOTE — Discharge Instructions (Signed)

## 2016-08-26 NOTE — Interval H&P Note (Signed)
History and Physical Interval Note:  08/26/2016 10:30 AM  Jonathan Harvey  has presented today for surgery, with the diagnosis of RIGHT KNEE OSTEOARTHRITIS  The various methods of treatment have been discussed with the patient and family. After consideration of risks, benefits and other options for treatment, the patient has consented to  Procedure(s): RIGHT TOTAL KNEE ARTHROPLASTY (Right) as a surgical intervention .  The patient's history has been reviewed, patient examined, no change in status, stable for surgery.  I have reviewed the patient's chart and labs.  Questions were answered to the patient's satisfaction.     Kerin Salen

## 2016-08-26 NOTE — Anesthesia Preprocedure Evaluation (Signed)
Anesthesia Evaluation  Patient identified by MRN, date of birth, ID band Patient awake    Reviewed: Allergy & Precautions, NPO status , Patient's Chart, lab work & pertinent test results, reviewed documented beta blocker date and time   History of Anesthesia Complications Negative for: history of anesthetic complications  Airway Mallampati: II  TM Distance: >3 FB Neck ROM: Full    Dental  (+) Dental Advisory Given   Pulmonary neg pulmonary ROS,    breath sounds clear to auscultation       Cardiovascular hypertension, Pt. on medications and Pt. on home beta blockers (-) angina(-) Past MI and (-) CHF  Rhythm:Regular     Neuro/Psych negative neurological ROS  negative psych ROS   GI/Hepatic negative GI ROS, (+) Hepatitis -, B  Endo/Other  negative endocrine ROS  Renal/GU Renal InsufficiencyRenal disease     Musculoskeletal  (+) Arthritis ,   Abdominal   Peds  Hematology  (+) Blood dyscrasia, ,   Anesthesia Other Findings   Reproductive/Obstetrics                             Anesthesia Physical Anesthesia Plan  ASA: III  Anesthesia Plan: Regional and General   Post-op Pain Management:    Induction:   PONV Risk Score and Plan: 2 and Ondansetron and Dexamethasone  Airway Management Planned: LMA  Additional Equipment:   Intra-op Plan:   Post-operative Plan: Extubation in OR  Informed Consent: I have reviewed the patients History and Physical, chart, labs and discussed the procedure including the risks, benefits and alternatives for the proposed anesthesia with the patient or authorized representative who has indicated his/her understanding and acceptance.   Dental advisory given  Plan Discussed with: Surgeon  Anesthesia Plan Comments:         Anesthesia Quick Evaluation

## 2016-08-26 NOTE — Anesthesia Procedure Notes (Signed)
Procedure Name: LMA Insertion Date/Time: 08/26/2016 11:36 AM Performed by: Mervyn Gay Pre-anesthesia Checklist: Patient identified, Patient being monitored, Timeout performed, Emergency Drugs available and Suction available Patient Re-evaluated:Patient Re-evaluated prior to inductionOxygen Delivery Method: Circle System Utilized Preoxygenation: Pre-oxygenation with 100% oxygen Intubation Type: IV induction Ventilation: Mask ventilation without difficulty LMA: LMA inserted LMA Size: 4.0 Number of attempts: 1 Placement Confirmation: positive ETCO2 and breath sounds checked- equal and bilateral Tube secured with: Tape Dental Injury: Teeth and Oropharynx as per pre-operative assessment

## 2016-08-26 NOTE — Progress Notes (Signed)
Orthopedic Tech Progress Note Patient Details:  Jonathan Harvey [birth year] [id number]  Ortho Devices Type of Ortho Device: Other (comment) Ortho Device/Splint Location: Bone foam zero degree footsie roll provided and applied to pt right foot.  pt tolerated well.    Right foot.  Ortho Device/Splint Interventions: Application   Kristopher Oppenheim 08/26/2016, 2:21 PM

## 2016-08-26 NOTE — Anesthesia Postprocedure Evaluation (Signed)
Anesthesia Post Note  Patient: Jonathan Harvey  Procedure(s) Performed: Procedure(s) (LRB): RIGHT TOTAL KNEE ARTHROPLASTY (Right)     Patient location during evaluation: PACU Anesthesia Type: General and Regional Level of consciousness: awake and alert Pain management: pain level controlled Vital Signs Assessment: post-procedure vital signs reviewed and stable Respiratory status: spontaneous breathing, nonlabored ventilation, respiratory function stable and patient connected to nasal cannula oxygen Cardiovascular status: blood pressure returned to baseline and stable Postop Assessment: no signs of nausea or vomiting Anesthetic complications: no    Last Vitals:  Vitals:   08/26/16 1450 08/26/16 1505  BP: 111/63 (!) 96/53  Pulse: 75 72  Resp: 10 12  Temp:      Last Pain:  Vitals:   08/26/16 1500  PainSc: 6         RLE Motor Response: Purposeful movement;Responds to commands (08/26/16 1505) RLE Sensation: Full sensation (08/26/16 1505)      Haleema Vanderheyden,W. EDMOND

## 2016-08-27 ENCOUNTER — Encounter (HOSPITAL_COMMUNITY): Payer: Self-pay | Admitting: Orthopedic Surgery

## 2016-08-27 LAB — CBC
HEMATOCRIT: 30.5 % — AB (ref 39.0–52.0)
Hemoglobin: 10 g/dL — ABNORMAL LOW (ref 13.0–17.0)
MCH: 30.2 pg (ref 26.0–34.0)
MCHC: 32.8 g/dL (ref 30.0–36.0)
MCV: 92.1 fL (ref 78.0–100.0)
PLATELETS: 278 10*3/uL (ref 150–400)
RBC: 3.31 MIL/uL — AB (ref 4.22–5.81)
RDW: 14.7 % (ref 11.5–15.5)
WBC: 10.8 10*3/uL — AB (ref 4.0–10.5)

## 2016-08-27 LAB — BASIC METABOLIC PANEL
ANION GAP: 7 (ref 5–15)
BUN: 26 mg/dL — AB (ref 6–20)
CHLORIDE: 103 mmol/L (ref 101–111)
CO2: 28 mmol/L (ref 22–32)
Calcium: 8.6 mg/dL — ABNORMAL LOW (ref 8.9–10.3)
Creatinine, Ser: 1.48 mg/dL — ABNORMAL HIGH (ref 0.61–1.24)
GFR calc Af Amer: 54 mL/min — ABNORMAL LOW (ref >60)
GFR, EST NON AFRICAN AMERICAN: 47 mL/min — AB (ref >60)
GLUCOSE: 202 mg/dL — AB (ref 65–99)
POTASSIUM: 4.4 mmol/L (ref 3.5–5.1)
Sodium: 138 mmol/L (ref 135–145)

## 2016-08-27 NOTE — Evaluation (Signed)
Physical Therapy Evaluation Patient Details Name: Jonathan Harvey MRN: [id number] DOB: 1946-02-29 Today's Date: 08/27/2016   History of Present Illness  Pt is a 72yo admitted 6/25 for R TKA. PSH: L THA revision in february.  Clinical Impression  Pt is s/p TKA resulting in the deficits listed below (see PT Problem List). Pt tolerating mobility well. Pt will benefit from skilled PT to increase their independence and safety with mobility to allow discharge to the venue listed below.      Follow Up Recommendations Home health PT;Supervision/Assistance - 24 hour    Equipment Recommendations  None recommended by PT    Recommendations for Other Services       Precautions / Restrictions Precautions Precautions: Knee Precaution Booklet Issued: Yes (comment) Required Braces or Orthoses: Knee Immobilizer - Right Restrictions Weight Bearing Restrictions: Yes RLE Weight Bearing: Weight bearing as tolerated      Mobility  Bed Mobility Overal bed mobility: Modified Independent             General bed mobility comments: hob elevated and used bed rail  Transfers Overall transfer level: Needs assistance Equipment used: Rolling walker (2 wheeled) Transfers: Sit to/from Stand Sit to Stand: Min guard         General transfer comment: v/c's for hand placement  Ambulation/Gait Ambulation/Gait assistance: Min guard Ambulation Distance (Feet): 120 Feet Assistive device: Rolling walker (2 wheeled) Gait Pattern/deviations: Step-through pattern;Decreased stride length Gait velocity: slow Gait velocity interpretation: Below normal speed for age/gender General Gait Details: v/c's for heel toe gait pattern with R foot and to decreased UE WBing and increase LE WBing. Pt's L LE is shorter than the right therefore pt ambulated with altered gait pattern to begin with. pt plans on bringing in shoe with heal lift tomrorow  Stairs            Wheelchair Mobility    Modified Rankin  (Stroke Patients Only)       Balance Overall balance assessment: No apparent balance deficits (not formally assessed)                                           Pertinent Vitals/Pain Pain Assessment: 0-10 Pain Score: 4  Pain Location: R knee Pain Descriptors / Indicators: Sore Pain Intervention(s): Monitored during session    Home Living Family/patient expects to be discharged to:: Private residence Living Arrangements: Spouse/significant other Available Help at Discharge: Family;Available 24 hours/day Type of Home: House Home Access: Stairs to enter Entrance Stairs-Rails: Right Entrance Stairs-Number of Steps: 3 Home Layout: One level Home Equipment: Walker - 2 wheels;Cane - single point;Shower seat - built in;Crutches;Bedside commode      Prior Function Level of Independence: Independent         Comments: was using cane     Hand Dominance   Dominant Hand: Right    Extremity/Trunk Assessment   Upper Extremity Assessment Upper Extremity Assessment: Overall WFL for tasks assessed    Lower Extremity Assessment Lower Extremity Assessment: RLE deficits/detail;LLE deficits/detail RLE Deficits / Details: able to initiate quad set adn achieve 45 deg active R knee flexion in supine LLE Deficits / Details: 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    Cervical / Trunk Assessment Cervical / Trunk Assessment: Normal  Communication   Communication: HOH  Cognition Arousal/Alertness: Awake/alert Behavior During Therapy: WFL for tasks assessed/performed Overall Cognitive  Status: Within Functional Limits for tasks assessed                                        General Comments General comments (skin integrity, edema, etc.): R knee in ace wrap    Exercises Total Joint Exercises Ankle Circles/Pumps: AROM;10 reps;Supine;Both Quad Sets: AROM;Right;10 reps;Supine Heel Slides: Right;AAROM;10 reps;Supine    Assessment/Plan    PT Assessment Patient needs continued PT services  PT Problem List Decreased strength;Decreased range of motion;Decreased activity tolerance;Decreased balance;Decreased mobility;Pain       PT Treatment Interventions DME instruction;Gait training;Stair training;Functional mobility training;Therapeutic activities;Therapeutic exercise    PT Goals (Current goals can be found in the Care Plan section)  Acute Rehab PT Goals Patient Stated Goal: return home PT Goal Formulation: With patient Time For Goal Achievement: 09/03/16 Potential to Achieve Goals: Good    Frequency 7X/week   Barriers to discharge        Co-evaluation               AM-PAC PT "6 Clicks" Daily Activity  Outcome Measure Difficulty turning over in bed (including adjusting bedclothes, sheets and blankets)?: None Difficulty moving from lying on back to sitting on the side of the bed? : None Difficulty sitting down on and standing up from a chair with arms (e.g., wheelchair, bedside commode, etc,.)?: A Little Help needed moving to and from a bed to chair (including a wheelchair)?: A Little Help needed walking in hospital room?: A Little Help needed climbing 3-5 steps with a railing? : A Little 6 Click Score: 20    End of Session Equipment Utilized During Treatment: Gait belt Activity Tolerance: Patient tolerated treatment well Patient left:  (on commode, instructed to use call light) Nurse Communication: Mobility status PT Visit Diagnosis: Difficulty in walking, not elsewhere classified (R26.2);Pain Pain - Right/Left: Right Pain - part of body: Knee    Time: 9357-0177 PT Time Calculation (min) (ACUTE ONLY): 33 min   Charges:   PT Evaluation $PT Eval Moderate Complexity: 1 Procedure PT Treatments $Gait Training: 8-22 mins   PT G Codes:        Kittie Plater, PT, DPT Pager #: [phone] Office #: [phone]   [place] 08/27/2016, 9:07 AM

## 2016-08-27 NOTE — Progress Notes (Signed)
PATIENT ID: Jonathan Harvey  MRN: [id number]  DOB/AGE:  Sep 19, 1945 / 72 y.o.  1 Day Post-Op Procedure(s) (LRB): RIGHT TOTAL KNEE ARTHROPLASTY (Right)    PROGRESS NOTE Subjective: Patient is alert, oriented, no Nausea, no Vomiting, yes passing gas. Taking PO well. Denies SOB, Chest or Calf Pain. Using Incentive Spirometer, PAS in place. Ambulate WBAT, Patient reports pain as 3/10 .    Objective: Vital signs in last 24 hours: Vitals:   08/26/16 1700 08/26/16 1714 08/26/16 2216 08/27/16 0531  BP: 117/68 (!) 113/53 (!) 112/50 (!) 100/47  Pulse: 81 67 84 82  Resp: 11 16 18 18   Temp: 97.8 F (36.6 C) 97.8 F (36.6 C) 97.7 F (36.5 C) 97.7 F (36.5 C)  TempSrc:  Oral Oral Oral  SpO2: 100% 99% 98% 100%  Weight:          Intake/Output from previous day: I/O last 3 completed shifts: In: 2084.2 [P.O.:480; I.V.:1604.2] Out: 200 [Blood:200]   Intake/Output this shift: No intake/output data recorded.   LABORATORY DATA: No results for input(s): WBC, HGB, HCT, PLT, NA, K, CL, CO2, BUN, CREATININE, GLUCOSE, GLUCAP, INR, CALCIUM in the last 72 hours.  Invalid input(s): PT, 2  Examination: Neurologically intact ABD soft Neurovascular intact Sensation intact distally Intact pulses distally Dorsiflexion/Plantar flexion intact Incision: dressing C/D/I No cellulitis present Compartment soft}  Assessment:   1 Day Post-Op Procedure(s) (LRB): RIGHT TOTAL KNEE ARTHROPLASTY (Right) ADDITIONAL DIAGNOSIS: Expected Acute Blood Loss Anemia, Spinal stenosis   Plan: PT/OT WBAT, AROM and PROM  DVT Prophylaxis:  SCDx72hrs, ASA 325 mg BID x 2 weeks DISCHARGE PLAN: Home DISCHARGE NEEDS: HHPT, Walker and 3-in-1 comode seat     Giuliana Handyside J 08/27/2016, 7:35 AM Patient ID: Jonathan Harvey, male   DOB: [birth year], 71 y.o.   MRN: [id number]

## 2016-08-27 NOTE — Progress Notes (Signed)
Physical Therapy Treatment Patient Details Name: Jonathan Harvey MRN: [id number] DOB: March 15, 1945 Today's Date: 08/27/2016    History of Present Illness Pt is a 71yo admitted 6/25 for R TKA. PSH: L THA revision in february.    PT Comments    Pt progressing very well. Acute PT to cont to follow to progress ambulation and R Knee ROM and strength.   Follow Up Recommendations  Outpatient PT;Supervision - Intermittent     Equipment Recommendations  None recommended by PT    Recommendations for Other Services       Precautions / Restrictions Precautions Precautions: Knee Precaution Booklet Issued: Yes (comment) Restrictions Weight Bearing Restrictions: Yes RLE Weight Bearing: Weight bearing as tolerated    Mobility  Bed Mobility               General bed mobility comments: pt up in chair  Transfers Overall transfer level: Needs assistance Equipment used: Rolling walker (2 wheeled) Transfers: Sit to/from Stand Sit to Stand: Supervision         General transfer comment: v/c's for hand placement, increased time  Ambulation/Gait Ambulation/Gait assistance: Min guard Ambulation Distance (Feet): 200 Feet Assistive device: Rolling walker (2 wheeled) Gait Pattern/deviations: Step-through pattern;Decreased stride length Gait velocity: slow Gait velocity interpretation: Below normal speed for age/gender General Gait Details: pt amb in shoes and was able to have a more fluid gait pattern. v/c's to achieve terminal knee extension in L knee during stance phase   Stairs Stairs: Yes   Stair Management: One rail Right;Sideways Number of Stairs: 2 (x3 trials) General stair comments: pt with good understanding  Wheelchair Mobility    Modified Rankin (Stroke Patients Only)       Balance Overall balance assessment: No apparent balance deficits (not formally assessed)                                          Cognition Arousal/Alertness:  Awake/alert Behavior During Therapy: WFL for tasks assessed/performed Overall Cognitive Status: Within Functional Limits for tasks assessed                                        Exercises Other Exercises Other Exercises: passive R knee stretch into flexion via shoe on towel on floor in sitting Other Exercises: passive knee stretch into extension with legs extended in chair with ankle on towel roll    General Comments        Pertinent Vitals/Pain Pain Assessment: 0-10 Pain Score: 6  Pain Location: R knee Pain Descriptors / Indicators: Constant Pain Intervention(s): Monitored during session    Home Living                      Prior Function            PT Goals (current goals can now be found in the care plan section) Progress towards PT goals: Progressing toward goals    Frequency    7X/week      PT Plan Current plan remains appropriate    Co-evaluation              AM-PAC PT "6 Clicks" Daily Activity  Outcome Measure  Difficulty turning over in bed (including adjusting bedclothes, sheets and blankets)?: None Difficulty moving from lying on back to  sitting on the side of the bed? : None Difficulty sitting down on and standing up from a chair with arms (e.g., wheelchair, bedside commode, etc,.)?: None Help needed moving to and from a bed to chair (including a wheelchair)?: None Help needed walking in hospital room?: A Little Help needed climbing 3-5 steps with a railing? : A Little 6 Click Score: 22    End of Session Equipment Utilized During Treatment: Gait belt Activity Tolerance: Patient tolerated treatment well Patient left: in chair;with call bell/phone within reach;with family/visitor present (in bone foam) Nurse Communication: Mobility status PT Visit Diagnosis: Difficulty in walking, not elsewhere classified (R26.2);Pain Pain - Right/Left: Right Pain - part of body: Knee     Time: 1333-1400 PT Time Calculation  (min) (ACUTE ONLY): 27 min  Charges:  $Gait Training: 8-22 mins $Therapeutic Exercise: 8-22 mins                    G Codes:       Kittie Plater, PT, DPT Pager #: [phone] Office #: [phone]    [place] 08/27/2016, 2:31 PM

## 2016-08-27 NOTE — Evaluation (Signed)
Occupational Therapy Evaluation Patient Details Name: Jonathan Harvey MRN: [id number] DOB: 06-Feb-1946 Today's Date: 08/27/2016    History of Present Illness Pt is a 71yo admitted 6/25 for R TKA. PSH: L THA revision in february.   Clinical Impression   PTA, pt was living with his wife and was independent. Currently, pt requires supervision-Min guard A for ADLs and functional mobility using RW. Provided education on LB ADLs, toilet transfer, and walk-in shower transfer with shower seat; pt demonstrated understanding. Answered all pt questions. Recommend dc home once medically stable per physician. All acute OT needs met and will sign off. Thank you.     Follow Up Recommendations  DC plan and follow up therapy as arranged by surgeon;Supervision/Assistance - 24 hour    Equipment Recommendations  None recommended by OT    Recommendations for Other Services PT consult     Precautions / Restrictions Precautions Precautions: Knee Precaution Booklet Issued: No Precaution Comments: Reviewed resting with knee in extension Required Braces or Orthoses: Knee Immobilizer - Right Restrictions Weight Bearing Restrictions: Yes RLE Weight Bearing: Weight bearing as tolerated      Mobility Bed Mobility               General bed mobility comments: pt up in chair  Transfers Overall transfer level: Needs assistance Equipment used: Rolling walker (2 wheeled) Transfers: Sit to/from Stand Sit to Stand: Supervision         General transfer comment: v/c's for hand placement, increased time    Balance Overall balance assessment: No apparent balance deficits (not formally assessed)                                         ADL either performed or assessed with clinical judgement   ADL Overall ADL's : Needs assistance/impaired                                       General ADL Comments: Pt performing ADLs and functional mobility at supervision- Min gaurd  level. Provided education on LB dressing, toilet transfer, and walk-in shower transfer. Pt verbalized and demonstrated understanding.  Provided education on knee precautions, RW management, scar management, and fall prevention     Vision         Perception     Praxis      Pertinent Vitals/Pain Pain Assessment: 0-10 Pain Score: 0-No pain Pain Location: R knee Pain Descriptors / Indicators: Constant Pain Intervention(s): Monitored during session;Repositioned;Ice applied     Hand Dominance Right   Extremity/Trunk Assessment Upper Extremity Assessment Upper Extremity Assessment: Overall WFL for tasks assessed   Lower Extremity Assessment Lower Extremity Assessment: Defer to PT evaluation RLE Deficits / Details: able to initiate quad set adn achieve 45 deg active R knee flexion in supine LLE Deficits / Details: 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   Cervical / Trunk Assessment Cervical / Trunk Assessment: Normal   Communication Communication Communication: HOH   Cognition Arousal/Alertness: Awake/alert Behavior During Therapy: WFL for tasks assessed/performed Overall Cognitive Status: Within Functional Limits for tasks assessed                                     General  Comments  Wife present throughout session    Exercises    Shoulder Instructions      Home Living Family/patient expects to be discharged to:: Private residence Living Arrangements: Spouse/significant other Available Help at Discharge: Family;Available 24 hours/day Type of Home: House Home Access: Stairs to enter [employer] of Steps: 3 Entrance Stairs-Rails: Right Home Layout: One level     Bathroom Shower/Tub: Occupational psychologist: Handicapped height     Home Equipment: Environmental consultant - 2 wheels;Cane - single point;Shower seat - built in;Crutches;Bedside commode;Grab bars - tub/shower;Hand held shower head          Prior  Functioning/Environment Level of Independence: Independent        Comments: was using cane        OT Problem List: Decreased strength;Decreased range of motion;Decreased activity tolerance;Impaired balance (sitting and/or standing);Decreased safety awareness;Decreased knowledge of use of DME or AE;Decreased knowledge of precautions;Pain      OT Treatment/Interventions:      OT Goals(Current goals can be found in the care plan section) Acute Rehab OT Goals Patient Stated Goal: return home OT Goal Formulation: With patient Time For Goal Achievement: 09/10/16 Potential to Achieve Goals: Good  OT Frequency:     Barriers to D/C:            Co-evaluation              AM-PAC PT "6 Clicks" Daily Activity     Outcome Measure Help from another person eating meals?: None Help from another person taking care of personal grooming?: A Little Help from another person toileting, which includes using toliet, bedpan, or urinal?: A Little Help from another person bathing (including washing, rinsing, drying)?: A Little Help from another person to put on and taking off regular upper body clothing?: None Help from another person to put on and taking off regular lower body clothing?: A Little 6 Click Score: 20   End of Session Equipment Utilized During Treatment: Gait belt;Rolling walker Nurse Communication: Mobility status  Activity Tolerance: Patient tolerated treatment well Patient left: in chair;with call bell/phone within reach;with family/visitor present  OT Visit Diagnosis: Unsteadiness on feet (R26.81);Other abnormalities of gait and mobility (R26.89);Muscle weakness (generalized) (M62.81);Pain Pain - Right/Left: Right Pain - part of body: Knee                Time: 5830-9407 OT Time Calculation (min): 19 min Charges:  OT General Charges $OT Visit: 1 Procedure OT Evaluation $OT Eval Low Complexity: 1 Procedure G-Codes:     [place], OTR/L Acute Rehab Pager:  [phone] Office: [place] 08/27/2016, 3:41 PM

## 2016-08-28 LAB — CBC
HEMATOCRIT: 27.6 % — AB (ref 39.0–52.0)
HEMOGLOBIN: 9 g/dL — AB (ref 13.0–17.0)
MCH: 30.1 pg (ref 26.0–34.0)
MCHC: 32.6 g/dL (ref 30.0–36.0)
MCV: 92.3 fL (ref 78.0–100.0)
Platelets: 223 10*3/uL (ref 150–400)
RBC: 2.99 MIL/uL — AB (ref 4.22–5.81)
RDW: 15.1 % (ref 11.5–15.5)
WBC: 6.3 10*3/uL (ref 4.0–10.5)

## 2016-08-28 NOTE — Progress Notes (Signed)
PATIENT ID: DURANTE VIOLETT  MRN: [id number]  DOB/AGE:  [birth year] / 71 y.o.  69 Days Post-Op Procedure(s) (LRB): RIGHT TOTAL KNEE ARTHROPLASTY (Right)    PROGRESS NOTE Subjective: Patient is alert, oriented, no Nausea, no Vomiting, yes passing gas. Taking PO well. Denies SOB, Chest or Calf Pain. Using Incentive Spirometer, PAS in place. Ambulate WBAT with pt walking 200 ft, Patient reports pain as moderate over night .    Objective: Vital signs in last 24 hours: Vitals:   08/27/16 0909 08/27/16 1300 08/27/16 2105 08/28/16 0458  BP: (!) 134/48 (!) 129/47 (!) 130/53 (!) 120/58  Pulse: 89 88 98 99  Resp:  16 18 18   Temp:  97.7 F (36.5 C) 98.7 F (37.1 C) 98 F (36.7 C)  TempSrc:  Oral Oral Oral  SpO2: 98% 100% 92% 98%  Weight:          Intake/Output from previous day: I/O last 3 completed shifts: In: 1224.2 [P.O.:720; I.V.:504.2] Out: 500 [Urine:500]   Intake/Output this shift: No intake/output data recorded.   LABORATORY DATA:  Recent Labs  08/27/16 0601 08/28/16 0536  WBC 10.8* 6.3  HGB 10.0* 9.0*  HCT 30.5* 27.6*  PLT 278 223  NA 138  --   K 4.4  --   CL 103  --   CO2 28  --   BUN 26*  --   CREATININE 1.48*  --   GLUCOSE 202*  --   CALCIUM 8.6*  --     Examination: Neurologically intact Neurovascular intact Sensation intact distally Intact pulses distally Dorsiflexion/Plantar flexion intact Incision: dressing C/D/I No cellulitis present Compartment soft}  Assessment:   69 Days Post-Op Procedure(s) (LRB): RIGHT TOTAL KNEE ARTHROPLASTY (Right) ADDITIONAL DIAGNOSIS: Expected Acute Blood Loss Anemia, Spinal stenosis  Plan: PT/OT WBAT, AROM and PROM  DVT Prophylaxis:  SCDx72hrs, ASA 325 mg BID x 2 weeks DISCHARGE PLAN: Home, later today DISCHARGE NEEDS: HHPT, Walker and 3-in-1 comode seat     Alyssa Rotondo R 08/28/2016, 7:41 AM

## 2016-08-28 NOTE — Progress Notes (Signed)
Physical Therapy Treatment Patient Details Name: Jonathan Harvey MRN: [id number] DOB: 1945-09-09 Today's Date: 08/28/2016    History of Present Illness Pt is a 71yo admitted 6/25 for R TKA. PSH: L THA revision in february.    PT Comments    Pt with increased pain this date. Pt with decreased R knee extension however anticipate this to be difficulty to overcome due to L LE being shorter than R, therefore leaving R knee prone to flexed position when in standing and walking.    Follow Up Recommendations  Outpatient PT;Supervision - Intermittent     Equipment Recommendations  None recommended by PT    Recommendations for Other Services       Precautions / Restrictions Precautions Precautions: Knee Precaution Booklet Issued: No Precaution Comments: Reviewed resting with knee in extension Restrictions Weight Bearing Restrictions: Yes RLE Weight Bearing: Weight bearing as tolerated    Mobility  Bed Mobility               General bed mobility comments: pt up in chair  Transfers Overall transfer level: Needs assistance Equipment used: Rolling walker (2 wheeled) Transfers: Sit to/from Stand Sit to Stand: Supervision         General transfer comment: pt with good hand placment, increased time  Ambulation/Gait Ambulation/Gait assistance: Supervision Ambulation Distance (Feet): 200 Feet Assistive device: Rolling walker (2 wheeled) Gait Pattern/deviations: Step-through pattern;Decreased stride length Gait velocity: slow Gait velocity interpretation: Below normal speed for age/gender General Gait Details: pt amb in shoes and was able to have a more fluid gait pattern. v/c's to achieve terminal knee extension in L knee during stance phase   Stairs Stairs: Yes   Stair Management: One rail Right;Sideways Number of Stairs: 2 (x2) General stair comments: pt with good understanding  Wheelchair Mobility    Modified Rankin (Stroke Patients Only)       Balance  Overall balance assessment: No apparent balance deficits (not formally assessed)                                          Cognition Arousal/Alertness: Awake/alert Behavior During Therapy: WFL for tasks assessed/performed Overall Cognitive Status: Within Functional Limits for tasks assessed                                        Exercises Total Joint Exercises Ankle Circles/Pumps: AROM;10 reps;Both Quad Sets: AROM;Right;10 reps Heel Slides: Right;AAROM;10 reps;Seated Goniometric ROM: -6-72 deg active knee flexion in sitting, 90 deg AA Other Exercises Other Exercises: passive R knee ROM into flexion and ext    General Comments        Pertinent Vitals/Pain Pain Assessment: 0-10 Pain Score: 8  Pain Location: R knee Pain Descriptors / Indicators: Constant Pain Intervention(s): Monitored during session    Home Living                      Prior Function            PT Goals (current goals can now be found in the care plan section) Acute Rehab PT Goals Patient Stated Goal: return home Progress towards PT goals: Progressing toward goals    Frequency    7X/week      PT Plan Current plan remains appropriate    Co-evaluation  AM-PAC PT "6 Clicks" Daily Activity  Outcome Measure  Difficulty turning over in bed (including adjusting bedclothes, sheets and blankets)?: None Difficulty moving from lying on back to sitting on the side of the bed? : None Difficulty sitting down on and standing up from a chair with arms (e.g., wheelchair, bedside commode, etc,.)?: None Help needed moving to and from a bed to chair (including a wheelchair)?: None Help needed walking in hospital room?: A Little Help needed climbing 3-5 steps with a railing? : A Little 6 Click Score: 22    End of Session Equipment Utilized During Treatment: Gait belt Activity Tolerance: Patient tolerated treatment well Patient left: in chair;with  call bell/phone within reach;with family/visitor present Nurse Communication: Mobility status PT Visit Diagnosis: Difficulty in walking, not elsewhere classified (R26.2);Pain Pain - Right/Left: Right Pain - part of body: Knee     Time: 2641-5830 PT Time Calculation (min) (ACUTE ONLY): 33 min  Charges:  $Gait Training: 8-22 mins $Therapeutic Exercise: 8-22 mins                    G Codes:      Kittie Plater, PT, DPT Pager #: [phone] Office #: [phone]    [place] 08/28/2016, 10:30 AM

## 2016-08-28 NOTE — Discharge Summary (Signed)
Patient ID: Jonathan Harvey MRN: [id number] DOB/AGE: 13-Apr-1945 71 y.o.  Admit date: 08/26/2016 Discharge date: 08/28/2016  Admission Diagnoses:  Principal Problem:   Primary osteoarthritis of right knee Active Problems:   Primary localized osteoarthritis of right knee   Discharge Diagnoses:  Same  Past Medical History:  Diagnosis Date  . Arthritis   . Hepatitis    Hepatitis B  2008  . History of kidney stones   . Hypertension   . Prostate cancer [facility])    s/p robotic prostatectomy 10/2006    Surgeries: Procedure(s): RIGHT TOTAL KNEE ARTHROPLASTY on 08/26/2016   Consultants:   Discharged Condition: Improved  Hospital Course: Jonathan Harvey is an 71 y.o. male who was admitted 08/26/2016 for operative treatment ofPrimary osteoarthritis of right knee. Patient has severe unremitting pain that affects sleep, daily activities, and work/hobbies. After pre-op clearance the patient was taken to the operating room on 08/26/2016 and underwent  Procedure(s): RIGHT TOTAL KNEE ARTHROPLASTY.    Patient was given perioperative antibiotics: Anti-infectives    Start     Dose/Rate Route Frequency Ordered Stop   08/26/16 1248  cefUROXime (ZINACEF) injection  Status:  Discontinued       As needed 08/26/16 1249 08/26/16 1401   08/26/16 0930  ceFAZolin (ANCEF) IVPB 2g/100 mL premix     2 g 200 mL/hr over 30 Minutes Intravenous To [facility]Stay [facility] 08/23/16 1251 08/26/16 1105       Patient was given sequential compression devices, early ambulation, and chemoprophylaxis to prevent DVT.  Patient benefited maximally from hospital stay and there were no complications.    Recent vital signs: Patient Vitals for the past 24 hrs:  BP Temp Temp src Pulse Resp SpO2  08/28/16 0458 (!) 120/58 98 F (36.7 C) Oral 99 18 98 %  08/27/16 2105 (!) 130/53 98.7 F (37.1 C) Oral 98 18 92 %  08/27/16 1300 (!) 129/47 97.7 F (36.5 C) Oral 88 16 100 %  08/27/16 0909 (!) 134/48 - - 89 - 98 %     Recent  laboratory studies:  Recent Labs  08/27/16 0601 08/28/16 0536  WBC 10.8* 6.3  HGB 10.0* 9.0*  HCT 30.5* 27.6*  PLT 278 223  NA 138  --   K 4.4  --   CL 103  --   CO2 28  --   BUN 26*  --   CREATININE 1.48*  --   GLUCOSE 202*  --   CALCIUM 8.6*  --      Discharge Medications:   Allergies as of 08/28/2016      Reactions   Lactose Intolerance (gi) Other (See Comments)   Mild GI upset      Medication List    TAKE these medications   aspirin EC 325 MG tablet Take 1 tablet (325 mg total) by mouth 2 (two) times daily. What changed:  Another medication with the same name was removed. Continue taking this medication, and follow the directions you see here.   cetirizine-pseudoephedrine 5-120 MG tablet Commonly known as:  ZYRTEC-D Take 1 tablet by mouth at bedtime.   furosemide 40 MG tablet Commonly known as:  LASIX Take 20 mg by mouth daily.   gabapentin 300 MG capsule Commonly known as:  NEURONTIN Take 300 mg by mouth 3 (three) times daily. Morning, Lunch, & Bedtime.   hydrocortisone 2.5 % rectal cream Commonly known as:  ANUSOL-HC Place 1 application rectally daily. Applied after bathing due to hemorrhoids   losartan-hydrochlorothiazide 100-25 MG tablet  Commonly known as:  HYZAAR Take 1 tablet by mouth daily.   metoprolol succinate 25 MG 24 hr tablet Commonly known as:  TOPROL-XL Take 25 mg by mouth daily.   multivitamin with minerals Tabs tablet Take 1 tablet by mouth daily.   oxyCODONE-acetaminophen 5-325 MG tablet Commonly known as:  ROXICET Take 1-2 tablets by mouth every 4 (four) hours as needed. What changed:  how much to take   simvastatin 40 MG tablet Commonly known as:  ZOCOR Take 20 mg by mouth at bedtime.   tiZANidine 2 MG tablet Commonly known as:  ZANAFLEX Take 1 tablet (2 mg total) by mouth every 6 (six) hours as needed for muscle spasms.   traMADol 50 MG tablet Commonly known as:  ULTRAM Take 100 mg by mouth every 4 (four) hours as  needed (for pain).   Vitamin B-12 5000 MCG Subl Place 5,000 mcg under the tongue daily.   Vitamin D 2000 units tablet Take 2,000 Units by mouth 2 (two) times daily.            Durable Medical Equipment        Start     Ordered   08/26/16 1712  DME Walker rolling  Once    Question:  Patient needs a walker to treat with the following condition  Answer:  Primary localized osteoarthritis of right knee   08/26/16 1711   08/26/16 1712  DME 3 n 1  Once     08/26/16 1711   08/26/16 1712  DME Bedside commode  Once    Question:  Patient needs a bedside commode to treat with the following condition  Answer:  Primary localized osteoarthritis of right knee   08/26/16 1711      Diagnostic Studies: No results found.  Disposition: 06-Home-Health Care Svc  Discharge Instructions    Call MD / Call 911    Complete by:  As directed    If you experience chest pain or shortness of breath, CALL 911 and be transported to the hospital emergency room.  If you develope a fever above 101 F, pus (white drainage) or increased drainage or redness at the wound, or calf pain, call your surgeon's office.   Constipation Prevention    Complete by:  As directed    Drink plenty of fluids.  Prune juice may be helpful.  You may use a stool softener, such as Colace (over the counter) 100 mg twice a day.  Use MiraLax (over the counter) for constipation as needed.   Diet - low sodium heart healthy    Complete by:  As directed    Driving restrictions    Complete by:  As directed    No driving for 2 weeks   Increase activity slowly as tolerated    Complete by:  As directed    Patient may shower    Complete by:  As directed    You may shower without a dressing once there is no drainage.  Do not wash over the wound.  If drainage remains, cover wound with plastic wrap and then shower.      Follow-up Information    [name], MD Follow up in 2 week(s).   Specialty:  Orthopedic Surgery Contact  information: [place] [address] [phone]            Signed: Theodosia Quay 08/28/2016, 7:43 AM

## 2016-08-28 NOTE — Plan of Care (Signed)
Problem: Safety: Goal: Ability to remain free from injury will improve Outcome: Progressing No falls during this admission. Call bell within reach. Bed in low and locked position. Patient alert and oriented. Clean and clear environment maintained. Nonskid footwear being utilized. 3/4 siderails in place. Patient verbalized understanding of safety instruction.  Problem: Activity: Goal: Ability to avoid complications of mobility impairment will improve Outcome: Progressing Patient is ambulating well with FWW and standby assist.

## 2016-09-02 ENCOUNTER — Ambulatory Visit: Payer: Medicare Other | Attending: Orthopedic Surgery | Admitting: Physical Therapy

## 2016-09-02 DIAGNOSIS — M6281 Muscle weakness (generalized): Secondary | ICD-10-CM

## 2016-09-02 DIAGNOSIS — M25561 Pain in right knee: Secondary | ICD-10-CM | POA: Insufficient documentation

## 2016-09-02 DIAGNOSIS — R2689 Other abnormalities of gait and mobility: Secondary | ICD-10-CM | POA: Insufficient documentation

## 2016-09-02 DIAGNOSIS — G8929 Other chronic pain: Secondary | ICD-10-CM | POA: Insufficient documentation

## 2016-09-02 DIAGNOSIS — R262 Difficulty in walking, not elsewhere classified: Secondary | ICD-10-CM | POA: Insufficient documentation

## 2016-09-02 DIAGNOSIS — M25661 Stiffness of right knee, not elsewhere classified: Secondary | ICD-10-CM | POA: Diagnosis present

## 2016-09-02 DIAGNOSIS — M25552 Pain in left hip: Secondary | ICD-10-CM | POA: Diagnosis present

## 2016-09-02 DIAGNOSIS — R600 Localized edema: Secondary | ICD-10-CM | POA: Diagnosis present

## 2016-09-02 NOTE — Therapy (Signed)
[facility] [place], [place], [address] Phone: [phone]   Fax:  [phone]  Physical Therapy Evaluation  Patient Details  Name: Jonathan Harvey MRN: [id number] Date of Birth: [birth year] Referring Provider: [name] MD  Encounter Date: 09/02/2016      PT End of Session - 09/02/16 1719    Visit Number 1   Number of Visits 16   Date for PT Re-Evaluation 11/01/16   PT Start Time 0152   PT Stop Time 0237   PT Time Calculation (min) 45 min   Activity Tolerance Patient tolerated treatment well   Behavior During Therapy [facility] for tasks assessed/performed      Past Medical History:  Diagnosis Date  . Arthritis   . Hepatitis    Hepatitis B  2008  . History of kidney stones   . Hypertension   . Prostate cancer [facility])    s/p robotic prostatectomy 10/2006    Past Surgical History:  Procedure Laterality Date  . BREAST LUMPECTOMY Left    lump remove from left arm  . CARPAL TUNNEL RELEASE Right 2010  . curved femur Left   . HAND SURGERY Right 1987   right thumb and index finger  . HERNIA REPAIR  2008   inguineal  . JOINT REPLACEMENT Left 2002   hip replacement  . MASTOIDECTOMY  1982  . radical prostectomy    . TOTAL HIP REVISION Left 04/24/2016   Procedure: TOTAL HIP REVISION;  Surgeon: [name], MD;  Location: [place];  Service: Orthopedics;  Laterality: Left;  . TOTAL KNEE ARTHROPLASTY Right 08/26/2016   Procedure: RIGHT TOTAL KNEE ARTHROPLASTY;  Surgeon: [name], MD;  Location: [place];  Service: Orthopedics;  Laterality: Right;    There were no vitals filed for this visit.       Subjective Assessment - 09/02/16 1725    Subjective The patient underwent a right total knee replacement on 08/26/16.  He is pleased with his progress thus far.  He is compliant to her HEP.  Movement increases his pain and rest decreases it.     Pertinent History Right total hip replacement.  Long h/o right knee pain.   Pain Score 4     Pain Location Knee   Pain Orientation Right   Pain Descriptors / Indicators Aching;Dull   Pain Type Surgical pain   Pain Onset 1 to 4 weeks ago   Pain Frequency Constant   Aggravating Factors  See above.   Pain Relieving Factors See above.            [facility] PT Assessment - 09/02/16 0001      Assessment   Medical Diagnosis Right total knee replacement.   Referring Provider [name] MD   Onset Date/Surgical Date --  08/26/16 (surgery date).     Precautions   Precautions --  No ultrasound.     Restrictions   Weight Bearing Restrictions No     Balance Screen   Has the patient fallen in the past 6 months No   Has the patient had a decrease in activity level because of a fear of falling?  No   Is the patient reluctant to leave their home because of a fear of falling?  No     Home Environment   Living Environment Private residence     Prior Function   Level of Independence Independent     Observation/Other Assessments   Observations Aquacel post-surgical dressing intact.  Observation/Other Assessments-Edema    Edema --  8.5 cms > on right at mid-patellar region.     ROM / Strength   AROM / PROM / Strength AROM;Strength     AROM   Overall AROM Comments Right knee extension actively -12 degrees and -7 degrees passively.  IActiv eright knee flexion= 80 degrees and passive= 85 degrees.     Strength   Overall Strength Comments Right hip strength into flexion and abduction= 4-/5.     Palpation   Palpation comment C/o diffuse right knee pain currently.     Ambulation/Gait   Gait Comments Safe ambulation with a FWW.            Objective measurements completed on examination: See above findings.          [place] Adult PT Treatment/Exercise - 09/02/16 0001      Modalities   Modalities Vasopneumatic     Vasopneumatic   Number Minutes Vasopneumatic  20 minutes   Vasopnuematic Location  --  Right knee.   Vasopneumatic Pressure Medium                   PT Short Term Goals - 09/02/16 1748      PT SHORT TERM GOAL #1   Title Independent with an initial HEP.   Time 2   Period Weeks   Status New     PT SHORT TERM GOAL #2   Title Full active right knee extension.   Time 2   Period Weeks   Status New           PT Long Term Goals - 09/02/16 1749      PT LONG TERM GOAL #1   Title Independent with an advanced HEP.   Time 6   Period Weeks   Status New     PT LONG TERM GOAL #2   Title Active right knee flexion to 115 degrees+ so the patient can perform functional tasks and do so with pain not > 2-3/10.   Time 6   Period Weeks   Status New     PT LONG TERM GOAL #3   Title Decrease edema to within 3 cms of non-affected side to assist with pain reduction and range of motion gains.     PT LONG TERM GOAL #4   Title Perform a reciprocating stair gait with one railing with pain not > 2-3/10.   Time 6   Period Weeks   Status New                Plan - 09/02/16 1741    Clinical Impression Statement Patient is doing very well s/p right total knee replcement performed on 08/26/16.  He is currently using a FWW.  He lacks range of motion as expected.  He currently has a significant amount of edema.  His deficits are impairing his functional mobility.  Patient will benefit from skilled physical therapy to addrss benefit.   History and Personal Factors relevant to plan of care: Long h/o right knee pain.   Clinical Presentation Stable   Clinical Presentation due to: Due to good surgical outcome.   Clinical Decision Making Low   Rehab Potential Excellent   PT Frequency 3x / week   PT Duration 6 weeks   PT Treatment/Interventions ADLs/Self Care Home Management;Cryotherapy;Electrical Stimulation;Functional mobility training;Stair training;Gait training;Therapeutic activities;Therapeutic exercise;Patient/family education;Passive range of motion;Manual techniques;Vasopneumatic Device   PT Next Visit Plan Total  knee protocol.  Vasopneumatic and e'stim.  Consulted and Agree with Plan of Care Patient      Patient will benefit from skilled therapeutic intervention in order to improve the following deficits and impairments:  Abnormal gait, Decreased activity tolerance, Decreased mobility, Decreased range of motion, Decreased strength, Increased edema, Pain  Visit Diagnosis: Chronic pain of right knee - Plan: PT plan of care cert/re-cert  Stiffness of right knee, not elsewhere classified - Plan: PT plan of care cert/re-cert  Localized edema - Plan: PT plan of care cert/re-cert  Muscle weakness (generalized) - Plan: PT plan of care cert/re-cert      G-Codes - 76/73/41 1723    Functional Assessment Tool Used (Outpatient Only) FOTO.Marland Kitchen62% limitation.   Functional Limitation Mobility: Walking and moving around   Mobility: Walking and Moving Around Current Status [phone]) At least 60 percent but less than 80 percent impaired, limited or restricted   Mobility: Walking and Moving Around Goal Status [phone]) At least 20 percent but less than 40 percent impaired, limited or restricted       Problem List Patient Active Problem List   Diagnosis Date Noted  . Primary localized osteoarthritis of right knee 08/26/2016  . Primary osteoarthritis of right knee 08/25/2016  . Status post revision of total hip 04/24/2016  . ED (erectile dysfunction) of organic origin 07/01/2012  . Malignant neoplasm of prostate ([place]) 07/01/2012  . Microscopic hematuria 07/01/2012  . Cervical stenosis of spinal canal 01/10/2012    Iara Monds, [place] MPT 09/02/2016, 5:54 PM  [facility] [address] [place], [place], [address] Phone: [phone]   Fax:  [phone]  Name: Jonathan Harvey MRN: [id number] Date of Birth: Jun 08, 1945

## 2016-09-03 ENCOUNTER — Ambulatory Visit: Payer: Medicare Other | Admitting: Physical Therapy

## 2016-09-03 DIAGNOSIS — M25561 Pain in right knee: Principal | ICD-10-CM

## 2016-09-03 DIAGNOSIS — M6281 Muscle weakness (generalized): Secondary | ICD-10-CM

## 2016-09-03 DIAGNOSIS — R600 Localized edema: Secondary | ICD-10-CM

## 2016-09-03 DIAGNOSIS — G8929 Other chronic pain: Secondary | ICD-10-CM

## 2016-09-03 DIAGNOSIS — M25661 Stiffness of right knee, not elsewhere classified: Secondary | ICD-10-CM

## 2016-09-03 NOTE — Therapy (Addendum)
[facility] [place], [place], [address] Phone: [phone]   Fax:  [phone]  Physical Therapy Treatment  Patient Details  Name: Jonathan Harvey MRN: [id number] Date of Birth: 11-21-1945 Referring Provider: [name] MD  Encounter Date: 09/03/2016      PT End of Session - 09/03/16 1507    Visit Number 2   Number of Visits 16   Date for PT Re-Evaluation 11/01/16   PT Start Time 0235   PT Stop Time 0333   PT Time Calculation (min) 58 min   Activity Tolerance Patient tolerated treatment well   Behavior During Therapy [facility] for tasks assessed/performed      Past Medical History:  Diagnosis Date  . Arthritis   . Hepatitis    Hepatitis B  2008  . History of kidney stones   . Hypertension   . Prostate cancer [facility])    s/p robotic prostatectomy 10/2006    Past Surgical History:  Procedure Laterality Date  . BREAST LUMPECTOMY Left    lump remove from left arm  . CARPAL TUNNEL RELEASE Right 2010  . curved femur Left   . HAND SURGERY Right 1987   right thumb and index finger  . HERNIA REPAIR  2008   inguineal  . JOINT REPLACEMENT Left 2002   hip replacement  . MASTOIDECTOMY  1982  . radical prostectomy    . TOTAL HIP REVISION Left 04/24/2016   Procedure: TOTAL HIP REVISION;  Surgeon: [name], MD;  Location: [place];  Service: Orthopedics;  Laterality: Left;  . TOTAL KNEE ARTHROPLASTY Right 08/26/2016   Procedure: RIGHT TOTAL KNEE ARTHROPLASTY;  Surgeon: [name], MD;  Location: [place];  Service: Orthopedics;  Laterality: Right;    There were no vitals filed for this visit.      Subjective Assessment - 09/03/16 1508    Subjective No new complaints.   Pain Score 4    Pain Location Knee   Pain Orientation Right   Pain Descriptors / Indicators Aching;Dull   Pain Type Surgical pain   Pain Onset 1 to 4 weeks ago                         [facility] Adult PT Treatment/Exercise - 09/03/16 0001       Exercises   Exercises Knee/Hip     Knee/Hip Exercises: Aerobic   Nustep Level 5 x 15 minutes moving forward x 2 to increase flexion..     Modalities   Modalities Vasopneumatic     Vasopneumatic   Number Minutes Vasopneumatic  20 minutes   Vasopnuematic Location  --  Right knee.   Vasopneumatic Pressure Medium     Manual Therapy   Manual Therapy Passive ROM   Passive ROM in supine:  PROM into flexion and extension x 10 minutes.                  PT Short Term Goals - 09/02/16 1748      PT SHORT TERM GOAL #1   Title Independent with an initial HEP.   Time 2   Period Weeks   Status New     PT SHORT TERM GOAL #2   Title Full active right knee extension.   Time 2   Period Weeks   Status New           PT Long Term Goals - 09/02/16 1749      PT LONG TERM GOAL #  1   Title Independent with an advanced HEP.   Time 6   Period Weeks   Status New     PT LONG TERM GOAL #2   Title Active right knee flexion to 115 degrees+ so the patient can perform functional tasks and do so with pain not > 2-3/10.   Time 6   Period Weeks   Status New     PT LONG TERM GOAL #3   Title Decrease edema to within 3 cms of non-affected side to assist with pain reduction and range of motion gains.     PT LONG TERM GOAL #4   Title Perform a reciprocating stair gait with one railing with pain not > 2-3/10.   Time 6   Period Weeks   Status New               Plan - 09/03/16 1517    Clinical Impression Statement Excellent job today with increased passive flexion.  Patient had a small opening on his lateral knee.  He states he scratched off a small scab.      Patient will benefit from skilled therapeutic intervention in order to improve the following deficits and impairments:  Abnormal gait, Decreased activity tolerance, Decreased mobility, Decreased range of motion, Decreased strength, Increased edema, Pain  Visit Diagnosis: Chronic pain of right knee  Stiffness of  right knee, not elsewhere classified  Localized edema  Muscle weakness (generalized)       G-Codes - 09/26/2016 1723    Functional Assessment Tool Used (Outpatient Only) FOTO.Marland Kitchen62% limitation.   Functional Limitation Mobility: Walking and moving around   Mobility: Walking and Moving Around Current Status [phone]) At least 60 percent but less than 80 percent impaired, limited or restricted   Mobility: Walking and Moving Around Goal Status [phone]) At least 20 percent but less than 40 percent impaired, limited or restricted      Problem List Patient Active Problem List   Diagnosis Date Noted  . Primary localized osteoarthritis of right knee 08/26/2016  . Primary osteoarthritis of right knee 08/25/2016  . Status post revision of total hip 04/24/2016  . ED (erectile dysfunction) of organic origin 07/01/2012  . Malignant neoplasm of prostate ([place]) 07/01/2012  . Microscopic hematuria 07/01/2012  . Cervical stenosis of spinal canal 01/10/2012    Jonathan Harvey, [place] MPT 09/03/2016, 3:40 PM  [facility] [address] [place], [place], [address] Phone: [phone]   Fax:  [phone]  Name: Jonathan Harvey MRN: [id number] Date of Birth: [birth year]

## 2016-09-05 ENCOUNTER — Ambulatory Visit: Payer: Medicare Other | Admitting: Physical Therapy

## 2016-09-05 ENCOUNTER — Encounter: Payer: Self-pay | Admitting: Physical Therapy

## 2016-09-05 DIAGNOSIS — M25661 Stiffness of right knee, not elsewhere classified: Secondary | ICD-10-CM

## 2016-09-05 DIAGNOSIS — G8929 Other chronic pain: Secondary | ICD-10-CM

## 2016-09-05 DIAGNOSIS — R600 Localized edema: Secondary | ICD-10-CM

## 2016-09-05 DIAGNOSIS — M25561 Pain in right knee: Secondary | ICD-10-CM | POA: Diagnosis not present

## 2016-09-05 DIAGNOSIS — M6281 Muscle weakness (generalized): Secondary | ICD-10-CM

## 2016-09-05 NOTE — Therapy (Signed)
[facility] [place], [place], [address] Phone: [phone]   Fax:  [phone]  Physical Therapy Treatment  Patient Details  Name: Jonathan Harvey MRN: [id number] Date of Birth: [birth year] Referring Provider: [name] MD  Encounter Date: 09/05/2016      PT End of Session - 09/05/16 1516    Visit Number 3   Number of Visits 16   Date for PT Re-Evaluation 11/01/16   Authorization Type KX modifier after 15 visit   PT Start Time 1512   PT Stop Time 1617   PT Time Calculation (min) 65 min   Activity Tolerance Patient tolerated treatment well   Behavior During Therapy [facility] for tasks assessed/performed      Past Medical History:  Diagnosis Date  . Arthritis   . Hepatitis    Hepatitis B  2008  . History of kidney stones   . Hypertension   . Prostate cancer [facility])    s/p robotic prostatectomy 10/2006    Past Surgical History:  Procedure Laterality Date  . BREAST LUMPECTOMY Left    lump remove from left arm  . CARPAL TUNNEL RELEASE Right 2010  . curved femur Left   . HAND SURGERY Right 1987   right thumb and index finger  . HERNIA REPAIR  2008   inguineal  . JOINT REPLACEMENT Left 2002   hip replacement  . MASTOIDECTOMY  1982  . radical prostectomy    . TOTAL HIP REVISION Left 04/24/2016   Procedure: TOTAL HIP REVISION;  Surgeon: [name], MD;  Location: [place];  Service: Orthopedics;  Laterality: Left;  . TOTAL KNEE ARTHROPLASTY Right 08/26/2016   Procedure: RIGHT TOTAL KNEE ARTHROPLASTY;  Surgeon: [name], MD;  Location: [place];  Service: Orthopedics;  Laterality: Right;    There were no vitals filed for this visit.      Subjective Assessment - 09/05/16 1513    Subjective Reports that he sees the MD next 09/10/2016. Reports that his R knee feels good and has done his morning exercises.   Pertinent History Right total hip replacement.  Long h/o right knee pain.   Limitations Walking;House hold  activities;Standing   How long can you sit comfortably? unlimited   How long can you stand comfortably? 10 minutes   How long can you walk comfortably? house hold distance using his straight cane   Patient Stated Goals Walk better   Currently in Pain? Yes   Pain Score 6    Pain Location Knee   Pain Orientation Right   Pain Descriptors / Indicators Aching;Dull;Sharp  Varies per patient report   Pain Type Surgical pain   Pain Onset 1 to 4 weeks ago   Pain Frequency Intermittent            [facility] PT Assessment - 09/05/16 0001      Assessment   Medical Diagnosis Right total knee replacement.   Onset Date/Surgical Date 08/26/16   Hand Dominance Right   Next MD Visit 09/10/2016   Prior Therapy yes for left knee pre-op preparation,     Restrictions   Weight Bearing Restrictions No     ROM / Strength   AROM / PROM / Strength AROM     AROM   Overall AROM  Deficits   AROM Assessment Site Knee   Right/Left Knee Right   Right Knee Extension -5   Right Knee Flexion 100  [place] Adult PT Treatment/Exercise - 09/05/16 0001      Knee/Hip Exercises: Aerobic   Nustep L6, seat 12-11, x15 min     Knee/Hip Exercises: Standing   Forward Lunges Right;2 sets;10 reps;3 seconds   Hip Abduction AROM;Right;2 sets;10 reps;Knee straight   Rocker Board 4 minutes     Knee/Hip Exercises: Supine   Short Arc Quad Sets Strengthening;Left;2 sets;10 reps   Short Arc Quad Sets Limitations 5 sec hold     Modalities   Modalities Vasopneumatic     Vasopneumatic   Number Minutes Vasopneumatic  15 minutes   Vasopnuematic Location  Knee   Vasopneumatic Pressure Medium   Vasopneumatic Temperature  48     Manual Therapy   Manual Therapy Passive ROM   Passive ROM PROM of R knee into flexion and passive HS stretch 3x30 sec to promote increased ROM                  PT Short Term Goals - 09/02/16 1748      PT SHORT TERM GOAL #1   Title Independent with an  initial HEP.   Time 2   Period Weeks   Status New     PT SHORT TERM GOAL #2   Title Full active right knee extension.   Time 2   Period Weeks   Status New           PT Long Term Goals - 09/02/16 1749      PT LONG TERM GOAL #1   Title Independent with an advanced HEP.   Time 6   Period Weeks   Status New     PT LONG TERM GOAL #2   Title Active right knee flexion to 115 degrees+ so the patient can perform functional tasks and do so with pain not > 2-3/10.   Time 6   Period Weeks   Status New     PT LONG TERM GOAL #3   Title Decrease edema to within 3 cms of non-affected side to assist with pain reduction and range of motion gains.     PT LONG TERM GOAL #4   Title Perform a reciprocating stair gait with one railing with pain not > 2-3/10.   Time 6   Period Weeks   Status New               Plan - 09/05/16 1605    Clinical Impression Statement Patient tolerated today's treatment well today with completion of exercises as directed with negetive complaint. Aquacel bandage still in place over R knee incision today and no AD in hand today although patient reports he is self weaning from [facility] and walker. Due to increased length of RLE at this time patient required a 6" step to stand on with LLE to allow for R hip abduction. AROM of R knee has greatly improved with 5-100 deg measured today. Normal modality response noted following removal of the modality.   Rehab Potential Excellent   Clinical Impairments Affecting Rehab Potential Right knee flexion contracture.   PT Frequency 3x / week   PT Duration 6 weeks   PT Treatment/Interventions ADLs/Self Care Home Management;Cryotherapy;Electrical Stimulation;Functional mobility training;Stair training;Gait training;Therapeutic activities;Therapeutic exercise;Patient/family education;Passive range of motion;Manual techniques;Vasopneumatic Device   PT Next Visit Plan Total knee protocol.  Vasopneumatic and e'stim.   Consulted and  Agree with Plan of Care Patient      Patient will benefit from skilled therapeutic intervention in order to improve the following deficits and  impairments:  Abnormal gait, Decreased activity tolerance, Decreased mobility, Decreased range of motion, Decreased strength, Increased edema, Pain  Visit Diagnosis: Chronic pain of right knee  Stiffness of right knee, not elsewhere classified  Localized edema  Muscle weakness (generalized)     Problem List Patient Active Problem List   Diagnosis Date Noted  . Primary localized osteoarthritis of right knee 08/26/2016  . Primary osteoarthritis of right knee 08/25/2016  . Status post revision of total hip 04/24/2016  . ED (erectile dysfunction) of organic origin 07/01/2012  . Malignant neoplasm of prostate ([place]) 07/01/2012  . Microscopic hematuria 07/01/2012  . Cervical stenosis of spinal canal 01/10/2012    Wynelle Fanny, PTA 09/05/2016, 4:19 PM  [facility] [place], [place], [address] Phone: [phone]   Fax:  [phone]  Name: Jonathan Harvey MRN: [id number] Date of Birth: [birth year]

## 2016-09-09 ENCOUNTER — Ambulatory Visit: Payer: Medicare Other | Admitting: *Deleted

## 2016-09-09 DIAGNOSIS — M25561 Pain in right knee: Secondary | ICD-10-CM | POA: Diagnosis not present

## 2016-09-09 DIAGNOSIS — M25661 Stiffness of right knee, not elsewhere classified: Secondary | ICD-10-CM

## 2016-09-09 DIAGNOSIS — R600 Localized edema: Secondary | ICD-10-CM

## 2016-09-09 DIAGNOSIS — M6281 Muscle weakness (generalized): Secondary | ICD-10-CM

## 2016-09-09 DIAGNOSIS — G8929 Other chronic pain: Secondary | ICD-10-CM

## 2016-09-09 NOTE — Therapy (Signed)
[facility] [place], [place], [address] Phone: [phone]   Fax:  [phone]  Physical Therapy Treatment  Patient Details  Name: Jonathan Harvey MRN: [id number] Date of Birth: November 08, 1945 Referring Provider: [name] MD  Encounter Date: 09/09/2016      PT End of Session - 09/09/16 1401    Visit Number 4   Number of Visits 16   Date for PT Re-Evaluation 11/01/16   Authorization Type KX modifier after 15 visit   PT Start Time 1345   PT Stop Time 1438   PT Time Calculation (min) 53 min      Past Medical History:  Diagnosis Date  . Arthritis   . Hepatitis    Hepatitis B  2008  . History of kidney stones   . Hypertension   . Prostate cancer [facility])    s/p robotic prostatectomy 10/2006    Past Surgical History:  Procedure Laterality Date  . BREAST LUMPECTOMY Left    lump remove from left arm  . CARPAL TUNNEL RELEASE Right 2010  . curved femur Left   . HAND SURGERY Right 1987   right thumb and index finger  . HERNIA REPAIR  2008   inguineal  . JOINT REPLACEMENT Left 2002   hip replacement  . MASTOIDECTOMY  1982  . radical prostectomy    . TOTAL HIP REVISION Left 04/24/2016   Procedure: TOTAL HIP REVISION;  Surgeon: [name], MD;  Location: [place];  Service: Orthopedics;  Laterality: Left;  . TOTAL KNEE ARTHROPLASTY Right 08/26/2016   Procedure: RIGHT TOTAL KNEE ARTHROPLASTY;  Surgeon: [name], MD;  Location: [place];  Service: Orthopedics;  Laterality: Right;    There were no vitals filed for this visit.      Subjective Assessment - 09/09/16 1346    Subjective Reports that he sees the MD next 09/10/2016. Reports that his R knee feels good and has done his morning exercises.   Pertinent History Right total hip replacement.  Long h/o right knee pain.   Limitations Walking;House hold activities;Standing   How long can you sit comfortably? unlimited   How long can you stand comfortably? 10 minutes   How  long can you walk comfortably? house hold distance using his straight cane   Patient Stated Goals Walk better   Currently in Pain? Yes   Pain Score 5    Pain Location Knee   Pain Orientation Right   Pain Descriptors / Indicators Aching;Dull;Sharp   Pain Type Surgical pain   Pain Onset 1 to 4 weeks ago   Pain Frequency Intermittent                                   PT Short Term Goals - 09/02/16 1748      PT SHORT TERM GOAL #1   Title Independent with an initial HEP.   Time 2   Period Weeks   Status New     PT SHORT TERM GOAL #2   Title Full active right knee extension.   Time 2   Period Weeks   Status New           PT Long Term Goals - 09/02/16 1749      PT LONG TERM GOAL #1   Title Independent with an advanced HEP.   Time 6   Period Weeks   Status New     PT LONG  TERM GOAL #2   Title Active right knee flexion to 115 degrees+ so the patient can perform functional tasks and do so with pain not > 2-3/10.   Time 6   Period Weeks   Status New     PT LONG TERM GOAL #3   Title Decrease edema to within 3 cms of non-affected side to assist with pain reduction and range of motion gains.     PT LONG TERM GOAL #4   Title Perform a reciprocating stair gait with one railing with pain not > 2-3/10.   Time 6   Period Weeks   Status New               Plan - 09/09/16 1403    Clinical Impression Statement Pt arrived to clinic today ambulating without an assistive device, but with knee in a flexed position due to other deficits. He did well with therex todayand was able to perform LAQs  and SLR without an extensor lag. PROM  was 5-100 degrees today. Normal response with Vaso and Estim as well.   Rehab Potential Excellent   Clinical Impairments Affecting Rehab Potential Right knee flexion contracture.   PT Frequency 3x / week   PT Duration 6 weeks   PT Treatment/Interventions ADLs/Self Care Home Management;Cryotherapy;Electrical  Stimulation;Functional mobility training;Stair training;Gait training;Therapeutic activities;Therapeutic exercise;Patient/family education;Passive range of motion;Manual techniques;Vasopneumatic Device   PT Next Visit Plan Total knee protocol.  Vasopneumatic and e'stim.   Consulted and Agree with Plan of Care Patient      Patient will benefit from skilled therapeutic intervention in order to improve the following deficits and impairments:  Abnormal gait, Decreased activity tolerance, Decreased mobility, Decreased range of motion, Decreased strength, Increased edema, Pain  Visit Diagnosis: Chronic pain of right knee  Stiffness of right knee, not elsewhere classified  Localized edema  Muscle weakness (generalized)     Problem List Patient Active Problem List   Diagnosis Date Noted  . Primary localized osteoarthritis of right knee 08/26/2016  . Primary osteoarthritis of right knee 08/25/2016  . Status post revision of total hip 04/24/2016  . ED (erectile dysfunction) of organic origin 07/01/2012  . Malignant neoplasm of prostate ([place]) 07/01/2012  . Microscopic hematuria 07/01/2012  . Cervical stenosis of spinal canal 01/10/2012   Seen by Gerald Stabs Punam Broussard PTA on 09/09/16.  Mali Applegate MPT   [facility] [place], [place], [address] Phone: [phone]   Fax:  [phone]  Name: Jonathan Harvey MRN: [id number] Date of Birth: 02-13-1946

## 2016-09-12 ENCOUNTER — Encounter: Payer: Self-pay | Admitting: Physical Therapy

## 2016-09-12 ENCOUNTER — Ambulatory Visit: Payer: Medicare Other | Admitting: Physical Therapy

## 2016-09-12 DIAGNOSIS — M25661 Stiffness of right knee, not elsewhere classified: Secondary | ICD-10-CM

## 2016-09-12 DIAGNOSIS — M25561 Pain in right knee: Secondary | ICD-10-CM | POA: Diagnosis not present

## 2016-09-12 DIAGNOSIS — M6281 Muscle weakness (generalized): Secondary | ICD-10-CM

## 2016-09-12 DIAGNOSIS — G8929 Other chronic pain: Secondary | ICD-10-CM

## 2016-09-12 DIAGNOSIS — R600 Localized edema: Secondary | ICD-10-CM

## 2016-09-12 NOTE — Therapy (Signed)
[facility] [place], [place], [address] Phone: [phone]   Fax:  [phone]  Physical Therapy Treatment  Patient Details  Name: Jonathan Harvey MRN: [id number] Date of Birth: 05/12/1945 Referring Provider: [name] MD  Encounter Date: 09/12/2016      PT End of Session - 09/12/16 1430    Visit Number 4   Number of Visits 16   Date for PT Re-Evaluation 11/01/16   Authorization Type KX modifier after 15 visit   PT Start Time 1345   PT Stop Time 1445   PT Time Calculation (min) 60 min   Activity Tolerance Patient tolerated treatment well   Behavior During Therapy [facility] for tasks assessed/performed      Past Medical History:  Diagnosis Date  . Arthritis   . Hepatitis    Hepatitis B  2008  . History of kidney stones   . Hypertension   . Prostate cancer [facility])    s/p robotic prostatectomy 10/2006    Past Surgical History:  Procedure Laterality Date  . BREAST LUMPECTOMY Left    lump remove from left arm  . CARPAL TUNNEL RELEASE Right 2010  . curved femur Left   . HAND SURGERY Right 1987   right thumb and index finger  . HERNIA REPAIR  2008   inguineal  . JOINT REPLACEMENT Left 2002   hip replacement  . MASTOIDECTOMY  1982  . radical prostectomy    . TOTAL HIP REVISION Left 04/24/2016   Procedure: TOTAL HIP REVISION;  Surgeon: [name], MD;  Location: [place];  Service: Orthopedics;  Laterality: Left;  . TOTAL KNEE ARTHROPLASTY Right 08/26/2016   Procedure: RIGHT TOTAL KNEE ARTHROPLASTY;  Surgeon: [name], MD;  Location: [place];  Service: Orthopedics;  Laterality: Right;    There were no vitals filed for this visit.      Subjective Assessment - 09/12/16 1402    Subjective Patient had no complaints after last tx   Pertinent History Right total hip replacement.  Long h/o right knee pain.   Limitations Walking;House hold activities;Standing   How long can you sit comfortably? unlimited   How long can  you stand comfortably? 10 minutes   How long can you walk comfortably? house hold distance using his straight cane   Patient Stated Goals Walk better   Currently in Pain? Yes   Pain Score 5    Pain Location Knee   Pain Orientation Right   Pain Descriptors / Indicators Aching;Dull   Pain Type Surgical pain   Pain Onset 1 to 4 weeks ago   Pain Frequency Intermittent   Aggravating Factors  prolong activity   Pain Relieving Factors rest            [facility] PT Assessment - 09/12/16 0001      ROM / Strength   AROM / PROM / Strength AROM;PROM     AROM   AROM Assessment Site Knee   Right/Left Knee Right   Right Knee Extension -10   Right Knee Flexion 100     PROM   PROM Assessment Site Knee   Right/Left Knee Right   Right Knee Extension -5   Right Knee Flexion 108                     [facility] Adult PT Treatment/Exercise - 09/12/16 0001      Knee/Hip Exercises: Standing   Rocker Board 4 minutes     Knee/Hip Exercises: Supine  Short Arc [employer] Strengthening;Left;10 reps;3 sets  with 2# ball squeeze   Straight Leg Raise with External Rotation Strengthening;Right;2 sets;10 reps     Vasopneumatic   Number Minutes Vasopneumatic  15 minutes   Vasopnuematic Location  Knee   Vasopneumatic Pressure Medium     Manual Therapy   Manual Therapy Passive ROM   Manual therapy comments gentle manual scar tissue massage around incision   Passive ROM PROM of R knee into flexion and extension                PT Education - 09/12/16 1424    Education provided Yes   Education Details HEP   Person(s) Educated Patient   Methods Explanation;Demonstration;Handout   Comprehension Verbalized understanding;Returned demonstration          PT Short Term Goals - 09/12/16 1431      PT SHORT TERM GOAL #1   Title Independent with an initial HEP.   Time 2   Period Weeks   Status Achieved     PT SHORT TERM GOAL #2   Title Full active right knee extension.   Time 2    Period Weeks   Status On-going           PT Long Term Goals - 09/12/16 1431      PT LONG TERM GOAL #1   Title Independent with an advanced HEP.   Time 6   Period Weeks   Status On-going     PT LONG TERM GOAL #2   Title Active right knee flexion to 115 degrees+ so the patient can perform functional tasks and do so with pain not > 2-3/10.   Time 6   Period Weeks   Status On-going     PT LONG TERM GOAL #3   Title Decrease edema to within 3 cms of non-affected side to assist with pain reduction and range of motion gains.   Time 6   Period Weeks   Status On-going     PT LONG TERM GOAL #4   Title Perform a reciprocating stair gait with one railing with pain not > 2-3/10.   Time 6   Period Weeks   Status On-going               Plan - 09/12/16 1432    Clinical Impression Statement Patient tolerated treatment well today. Patient progressing with knee ROM and strengthening. Patient was issued HEP for home progresion. Paatient reported doing exercises at home. Patient has difficulty walking due to lack of ext. Patient met STG#1 others ongoing due to ROM, strength and pain deficts.    Rehab Potential Excellent   Clinical Impairments Affecting Rehab Potential Right knee flexion contracture.   PT Frequency 3x / week   PT Duration 6 weeks   PT Treatment/Interventions ADLs/Self Care Home Management;Cryotherapy;Electrical Stimulation;Functional mobility training;Stair training;Gait training;Therapeutic activities;Therapeutic exercise;Patient/family education;Passive range of motion;Manual techniques;Vasopneumatic Device   PT Next Visit Plan Total knee protocol.  Vasopneumatic   Consulted and Agree with Plan of Care Patient      Patient will benefit from skilled therapeutic intervention in order to improve the following deficits and impairments:  Abnormal gait, Decreased activity tolerance, Decreased mobility, Decreased range of motion, Decreased strength, Increased edema,  Pain  Visit Diagnosis: Chronic pain of right knee  Stiffness of right knee, not elsewhere classified  Localized edema  Muscle weakness (generalized)     Problem List Patient Active Problem List   Diagnosis Date Noted  . Primary  localized osteoarthritis of right knee 08/26/2016  . Primary osteoarthritis of right knee 08/25/2016  . Status post revision of total hip 04/24/2016  . ED (erectile dysfunction) of organic origin 07/01/2012  . Malignant neoplasm of prostate ([place]) 07/01/2012  . Microscopic hematuria 07/01/2012  . Cervical stenosis of spinal canal 01/10/2012    Brayli Klingbeil P, PTA 09/12/2016, 2:46 PM  [facility] [place], [place], [address] Phone: [phone]   Fax:  [phone]  Name: AMANDA POTE MRN: [id number] Date of Birth: 08-23-1945

## 2016-09-12 NOTE — Patient Instructions (Addendum)
  Strengthening: Terminal Knee Extension (Supine)   With right knee over bolster, straighten knee by tightening muscles on top of thigh. Keep bottom of knee on bolster. Repeat _10___ times per set. Do ___2-3_ sets per session. Do _2-3___ sessions per day.   Knee Extension (Sitting)   Place __0-3__ pound weight on left ankle and straighten knee fully, lower slowly. Repeat _10___ times per set. Do __2-3__ sets per session. Do __2-3__ sessions per day.      Strengthening: Hip Abduction (Side-Lying)  Strengthening: Straight Leg Raise (Phase 1)  Repeat _10___ times per set. Do __2__ sets per session. Do __2__ sessions per day.   Bridging   Slowly raise buttocks from floor, keeping stomach tight. Repeat _10___ times per set. Do __2__ sets per session. Do __2__ sessions per day.   Straight Leg Raise   Tighten stomach and slowly raise locked right leg __4__ inches from floor. Repeat __10-30__ times per set. Do __2__ sets per session. Do __2__ sessions per day.    Sitting knee extension stretch    Place one foot on table. Straighten leg and attempt to keep it straight, then push down until feel a stretch. Hold _30__ seconds. Repeat __5-10_ times each leg, alternating. Do _2-4__ sessions per day.      Knee Flexion Stretch on Step  Place foot on step and lean forward until you feel a good stretch in front of knee.   hold 30 sec x 5-10 perform 2-4 x daily

## 2016-09-17 ENCOUNTER — Encounter: Payer: Self-pay | Admitting: Physical Therapy

## 2016-09-17 ENCOUNTER — Ambulatory Visit: Payer: Medicare Other | Admitting: Physical Therapy

## 2016-09-17 DIAGNOSIS — M6281 Muscle weakness (generalized): Secondary | ICD-10-CM

## 2016-09-17 DIAGNOSIS — G8929 Other chronic pain: Secondary | ICD-10-CM

## 2016-09-17 DIAGNOSIS — M25561 Pain in right knee: Principal | ICD-10-CM

## 2016-09-17 DIAGNOSIS — M25661 Stiffness of right knee, not elsewhere classified: Secondary | ICD-10-CM

## 2016-09-17 DIAGNOSIS — R600 Localized edema: Secondary | ICD-10-CM

## 2016-09-17 NOTE — Therapy (Signed)
[facility] [place], [place], [address] Phone: [phone]   Fax:  [phone]  Physical Therapy Treatment  Patient Details  Name: Jonathan Harvey MRN: [id number] Date of Birth: 08/08/1945 Referring Provider: [name] MD  Encounter Date: 09/17/2016      PT End of Session - 09/17/16 1203    Visit Number 6   Number of Visits 16   Date for PT Re-Evaluation 11/01/16   Authorization Type KX modifier after 15 visit   PT Start Time 1200   PT Stop Time 1246   PT Time Calculation (min) 46 min   Activity Tolerance Patient tolerated treatment well   Behavior During Therapy [facility] for tasks assessed/performed      Past Medical History:  Diagnosis Date  . Arthritis   . Hepatitis    Hepatitis B  2008  . History of kidney stones   . Hypertension   . Prostate cancer [facility])    s/p robotic prostatectomy 10/2006    Past Surgical History:  Procedure Laterality Date  . BREAST LUMPECTOMY Left    lump remove from left arm  . CARPAL TUNNEL RELEASE Right 2010  . curved femur Left   . HAND SURGERY Right 1987   right thumb and index finger  . HERNIA REPAIR  2008   inguineal  . JOINT REPLACEMENT Left 2002   hip replacement  . MASTOIDECTOMY  1982  . radical prostectomy    . TOTAL HIP REVISION Left 04/24/2016   Procedure: TOTAL HIP REVISION;  Surgeon: [name], MD;  Location: [place];  Service: Orthopedics;  Laterality: Left;  . TOTAL KNEE ARTHROPLASTY Right 08/26/2016   Procedure: RIGHT TOTAL KNEE ARTHROPLASTY;  Surgeon: [name], MD;  Location: [place];  Service: Orthopedics;  Laterality: Right;    There were no vitals filed for this visit.      Subjective Assessment - 09/17/16 1200    Subjective Requests that he needs to end treatment and leave by 12:45. Reports that he has been in his shop lately and trying to be weary of overdoing exercises. Reports numbness along the R medial knee just below knee joint.   Pertinent History  Right total hip replacement.  Long h/o right knee pain.   Limitations Walking;House hold activities;Standing   How long can you sit comfortably? unlimited   How long can you stand comfortably? 10 minutes   How long can you walk comfortably? house hold distance using his straight cane   Patient Stated Goals Walk better   Currently in Pain? Yes   Pain Score 5    Pain Location Knee   Pain Orientation Right   Pain Descriptors / Indicators Discomfort   Pain Type Surgical pain   Pain Onset 1 to 4 weeks ago            [facility] PT Assessment - 09/17/16 0001      Assessment   Medical Diagnosis Right total knee replacement.   Onset Date/Surgical Date 08/26/16   Hand Dominance Right   Next MD Visit 10/2016   Prior Therapy yes for left knee pre-op preparation,     Restrictions   Weight Bearing Restrictions No     Observation/Other Assessments-Edema    Edema Circumferential     Circumferential Edema   Circumferential - Right 46 cm   Circumferential - Left  40 cm                     [facility] Adult  PT Treatment/Exercise - 09/17/16 0001      Knee/Hip Exercises: Aerobic   Nustep L7, seat 10 x15 min     Knee/Hip Exercises: Standing   Forward Lunges Right;2 sets;10 reps  x10 sec hold followed by HS stretch     Modalities   Modalities Electrical Stimulation;Vasopneumatic     Electrical Stimulation   Electrical Stimulation Location R knee   Electrical Stimulation Action IFC   Electrical Stimulation Parameters 1-10 hz x12 min   Electrical Stimulation Goals Pain;Edema     Vasopneumatic   Number Minutes Vasopneumatic  12 minutes   Vasopnuematic Location  Knee   Vasopneumatic Pressure Medium   Vasopneumatic Temperature  34                  PT Short Term Goals - 09/12/16 1431      PT SHORT TERM GOAL #1   Title Independent with an initial HEP.   Time 2   Period Weeks   Status Achieved     PT SHORT TERM GOAL #2   Title Full active right knee extension.    Time 2   Period Weeks   Status On-going           PT Long Term Goals - 09/17/16 1243      PT LONG TERM GOAL #1   Title Independent with an advanced HEP.   Time 6   Period Weeks   Status On-going     PT LONG TERM GOAL #2   Title Active right knee flexion to 115 degrees+ so the patient can perform functional tasks and do so with pain not > 2-3/10.   Time 6   Period Weeks   Status On-going     PT LONG TERM GOAL #3   Title Decrease edema to within 3 cms of non-affected side to assist with pain reduction and range of motion gains.   Time 6   Period Weeks   Status On-going  R knee > L knee with 6 cm difference 09/17/2016     PT LONG TERM GOAL #4   Title Perform a reciprocating stair gait with one railing with pain not > 2-3/10.   Time 6   Period Weeks   Status On-going               Plan - 09/17/16 1245    Clinical Impression Statement Patient tolerated today's treatment well today as he had no complaints with any exercise today. Patient did request to end treatment early to make it to another appointment. Patient experienced good sensation with forward lunges that he followed with HS stretch. Normal modalities respoinse noted following removal of the modalities.   Rehab Potential Excellent   Clinical Impairments Affecting Rehab Potential Right knee flexion contracture.   PT Frequency 3x / week   PT Duration 6 weeks   PT Treatment/Interventions ADLs/Self Care Home Management;Cryotherapy;Electrical Stimulation;Functional mobility training;Stair training;Gait training;Therapeutic activities;Therapeutic exercise;Patient/family education;Passive range of motion;Manual techniques;Vasopneumatic Device   PT Next Visit Plan Total knee protocol.  Vasopneumatic   Consulted and Agree with Plan of Care Patient      Patient will benefit from skilled therapeutic intervention in order to improve the following deficits and impairments:  Abnormal gait, Decreased activity tolerance,  Decreased mobility, Decreased range of motion, Decreased strength, Increased edema, Pain  Visit Diagnosis: Chronic pain of right knee  Stiffness of right knee, not elsewhere classified  Localized edema  Muscle weakness (generalized)     Problem List Patient Active  Problem List   Diagnosis Date Noted  . Primary localized osteoarthritis of right knee 08/26/2016  . Primary osteoarthritis of right knee 08/25/2016  . Status post revision of total hip 04/24/2016  . ED (erectile dysfunction) of organic origin 07/01/2012  . Malignant neoplasm of prostate ([place]) 07/01/2012  . Microscopic hematuria 07/01/2012  . Cervical stenosis of spinal canal 01/10/2012    Wynelle Fanny, PTA 09/17/2016, 1:34 PM  [facility] [place], [place], [address] Phone: [phone]   Fax:  [phone]  Name: Jonathan Harvey MRN: [id number] Date of Birth: 10/15/1945

## 2016-09-19 ENCOUNTER — Ambulatory Visit: Payer: Medicare Other | Admitting: Physical Therapy

## 2016-09-19 ENCOUNTER — Encounter: Payer: Self-pay | Admitting: Physical Therapy

## 2016-09-19 DIAGNOSIS — M25561 Pain in right knee: Secondary | ICD-10-CM | POA: Diagnosis not present

## 2016-09-19 DIAGNOSIS — M25661 Stiffness of right knee, not elsewhere classified: Secondary | ICD-10-CM

## 2016-09-19 DIAGNOSIS — R600 Localized edema: Secondary | ICD-10-CM

## 2016-09-19 DIAGNOSIS — M6281 Muscle weakness (generalized): Secondary | ICD-10-CM

## 2016-09-19 DIAGNOSIS — G8929 Other chronic pain: Secondary | ICD-10-CM

## 2016-09-19 NOTE — Therapy (Signed)
[facility] [place], [place], [address] Phone: [phone]   Fax:  [phone]  Physical Therapy Treatment  Patient Details  Name: Jonathan Harvey MRN: [id number] Date of Birth: Apr 24, 1945 Referring Provider: [name] MD  Encounter Date: 09/19/2016      PT End of Session - 09/19/16 1231    Visit Number 7   Number of Visits 16   Date for PT Re-Evaluation 11/01/16   Authorization Type KX modifier after 15 visit   PT Start Time 1208   PT Stop Time 1316   PT Time Calculation (min) 68 min   Activity Tolerance Patient tolerated treatment well   Behavior During Therapy [facility] for tasks assessed/performed      Past Medical History:  Diagnosis Date  . Arthritis   . Hepatitis    Hepatitis B  2008  . History of kidney stones   . Hypertension   . Prostate cancer [facility])    s/p robotic prostatectomy 10/2006    Past Surgical History:  Procedure Laterality Date  . BREAST LUMPECTOMY Left    lump remove from left arm  . CARPAL TUNNEL RELEASE Right 2010  . curved femur Left   . HAND SURGERY Right 1987   right thumb and index finger  . HERNIA REPAIR  2008   inguineal  . JOINT REPLACEMENT Left 2002   hip replacement  . MASTOIDECTOMY  1982  . radical prostectomy    . TOTAL HIP REVISION Left 04/24/2016   Procedure: TOTAL HIP REVISION;  Surgeon: [name], MD;  Location: [place];  Service: Orthopedics;  Laterality: Left;  . TOTAL KNEE ARTHROPLASTY Right 08/26/2016   Procedure: RIGHT TOTAL KNEE ARTHROPLASTY;  Surgeon: [name], MD;  Location: [place];  Service: Orthopedics;  Laterality: Right;    There were no vitals filed for this visit.      Subjective Assessment - 09/19/16 1228    Subjective Reports that he has noticed a white spot on his knee that is tender.   Pertinent History Right total hip replacement.  Long h/o right knee pain.   Limitations Walking;House hold activities;Standing   How long can you sit comfortably?  unlimited   How long can you stand comfortably? 10 minutes   How long can you walk comfortably? house hold distance using his straight cane   Patient Stated Goals Walk better   Currently in Pain? Yes   Pain Score 5    Pain Location Knee   Pain Orientation Right   Pain Descriptors / Indicators Discomfort   Pain Type Surgical pain   Pain Onset 1 to 4 weeks ago   Pain Frequency Intermittent   Aggravating Factors  Walking            [facility] PT Assessment - 09/19/16 0001      Assessment   Medical Diagnosis Right total knee replacement.   Onset Date/Surgical Date 08/26/16   Hand Dominance Right   Next MD Visit 10/2016   Prior Therapy yes for left knee pre-op preparation,     Restrictions   Weight Bearing Restrictions No     ROM / Strength   AROM / PROM / Strength AROM     AROM   Overall AROM  Within functional limits for tasks performed;Deficits   AROM Assessment Site Knee   Right/Left Knee Right   Right Knee Extension -3   Right Knee Flexion 117  [facility] Adult PT Treatment/Exercise - 09/19/16 0001      Knee/Hip Exercises: Aerobic   Nustep L5 x15 min     Knee/Hip Exercises: Standing   Forward Lunges Right;1 set;10 reps;5 seconds   Rocker Board 3 minutes     Knee/Hip Exercises: Seated   Long Arc Quad Strengthening;Right;1 set;15 reps   Long Arc Quad Weight 4 lbs.     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   Electrical Stimulation Parameters 1-10 hz x15 nin   Electrical Stimulation Goals Pain;Edema     Vasopneumatic   Number Minutes Vasopneumatic  15 minutes   Vasopnuematic Location  Knee   Vasopneumatic Pressure Medium   Vasopneumatic Temperature  34     Manual Therapy   Manual Therapy Soft tissue mobilization;Passive ROM   Soft tissue mobilization R incision mobilizations and patella mobilizations to  promote proper mobility   Passive ROM PROM of R knee into flexion and extension                  PT Short Term Goals - 09/19/16 1316      PT SHORT TERM GOAL #1   Title Independent with an initial HEP.   Time 2   Period Weeks   Status Achieved     PT SHORT TERM GOAL #2   Title Full active right knee extension.   Time 2   Period Weeks   Status On-going  AROM R knee ext -3 deg 09/19/2016           PT Long Term Goals - 09/19/16 1315      PT LONG TERM GOAL #1   Title Independent with an advanced HEP.   Time 6   Period Weeks   Status On-going     PT LONG TERM GOAL #2   Title Active right knee flexion to 115 degrees+ so the patient can perform functional tasks and do so with pain not > 2-3/10.   Time 6   Period Weeks   Status Achieved  AROM R kne flexion 117 deg 09/19/2016     PT LONG TERM GOAL #3   Title Decrease edema to within 3 cms of non-affected side to assist with pain reduction and range of motion gains.   Time 6   Period Weeks   Status On-going  R knee > L knee with 6 cm difference 09/17/2016     PT LONG TERM GOAL #4   Title Perform a reciprocating stair gait with one railing with pain not > 2-3/10.   Time 6   Period Weeks   Status On-going               Plan - 09/19/16 1338    Clinical Impression Statement Patient continues to tolerate treatments well with no complaints of any increased pain. Patient very methodical and consistant with hold times of LAQ and forward lunges. Small white area observed in the inferiomedial aspect of R knee and [place] App[place], MPT consulted regarding the area of tenderness. Edema appears and palpated in the region of the white colored area that was of concern to patient as well as laterosuperior region of R knee. Keloid scarring development noted in inferior incision with incision mobilizations completed. AROM of R knee measured as 3-117 deg today in supine. Normal modalities response noted folllowing removal of the  modalities.   Rehab Potential Excellent  PT Frequency 3x / week   PT Duration 6 weeks   PT Treatment/Interventions ADLs/Self Care Home Management;Cryotherapy;Electrical Stimulation;Functional mobility training;Stair training;Gait training;Therapeutic activities;Therapeutic exercise;Patient/family education;Passive range of motion;Manual techniques;Vasopneumatic Device   PT Next Visit Plan Total knee protocol.  Vasopneumatic   Consulted and Agree with Plan of Care Patient      Patient will benefit from skilled therapeutic intervention in order to improve the following deficits and impairments:  Abnormal gait, Decreased activity tolerance, Decreased mobility, Decreased range of motion, Decreased strength, Increased edema, Pain  Visit Diagnosis: Chronic pain of right knee  Stiffness of right knee, not elsewhere classified  Localized edema  Muscle weakness (generalized)     Problem List Patient Active Problem List   Diagnosis Date Noted  . Primary localized osteoarthritis of right knee 08/26/2016  . Primary osteoarthritis of right knee 08/25/2016  . Status post revision of total hip 04/24/2016  . ED (erectile dysfunction) of organic origin 07/01/2012  . Malignant neoplasm of prostate ([place]) 07/01/2012  . Microscopic hematuria 07/01/2012  . Cervical stenosis of spinal canal 01/10/2012    Wynelle Fanny, PTA 09/19/2016, 1:44 PM  [facility] [address] [place], [place], [address] Phone: [phone]   Fax:  [phone]  Name: NASIR BRIGHT MRN: [id number] Date of Birth: 06-07-1945

## 2016-09-23 ENCOUNTER — Ambulatory Visit: Payer: Medicare Other | Admitting: Physical Therapy

## 2016-09-23 DIAGNOSIS — R600 Localized edema: Secondary | ICD-10-CM

## 2016-09-23 DIAGNOSIS — M25561 Pain in right knee: Secondary | ICD-10-CM | POA: Diagnosis not present

## 2016-09-23 DIAGNOSIS — G8929 Other chronic pain: Secondary | ICD-10-CM

## 2016-09-23 DIAGNOSIS — M25661 Stiffness of right knee, not elsewhere classified: Secondary | ICD-10-CM

## 2016-09-23 NOTE — Therapy (Signed)
[facility] [place], [place], [address] Phone: [phone]   Fax:  [phone]  Physical Therapy Treatment  Patient Details  Name: Jonathan Harvey MRN: [id number] Date of Birth: August 22, 1945 Referring Provider: [name] MD  Encounter Date: 09/23/2016      PT End of Session - 09/23/16 1246    Visit Number 8   Number of Visits 16   Date for PT Re-Evaluation 11/01/16   PT Start Time 1204   PT Stop Time 0174   PT Time Calculation (min) 55 min      Past Medical History:  Diagnosis Date  . Arthritis   . Hepatitis    Hepatitis B  2008  . History of kidney stones   . Hypertension   . Prostate cancer [facility])    s/p robotic prostatectomy 10/2006    Past Surgical History:  Procedure Laterality Date  . BREAST LUMPECTOMY Left    lump remove from left arm  . CARPAL TUNNEL RELEASE Right 2010  . curved femur Left   . HAND SURGERY Right 1987   right thumb and index finger  . HERNIA REPAIR  2008   inguineal  . JOINT REPLACEMENT Left 2002   hip replacement  . MASTOIDECTOMY  1982  . radical prostectomy    . TOTAL HIP REVISION Left 04/24/2016   Procedure: TOTAL HIP REVISION;  Surgeon: [name], MD;  Location: [place];  Service: Orthopedics;  Laterality: Left;  . TOTAL KNEE ARTHROPLASTY Right 08/26/2016   Procedure: RIGHT TOTAL KNEE ARTHROPLASTY;  Surgeon: [name], MD;  Location: [place];  Service: Orthopedics;  Laterality: Right;    There were no vitals filed for this visit.      Subjective Assessment - 09/23/16 1247    Subjective I'm doing good.  Been cleaning out my shop.   Pain Score 5    Pain Location Knee   Pain Orientation Right   Pain Descriptors / Indicators Discomfort   Pain Type Surgical pain   Pain Onset 1 to 4 weeks ago                         [facility] Adult PT Treatment/Exercise - 09/23/16 0001      Knee/Hip Exercises: Standing   Forward Lunges Limitations 14 inch box lunges x 4  minutes.   Other Standing Knee Exercises Rockerboard x 5 minutes.     Acupuncturist Location Right knee.   Electrical Stimulation Action IFC   Electrical Stimulation Parameters 1-10 Hz.   Electrical Stimulation Goals Edema;Pain     Vasopneumatic   Number Minutes Vasopneumatic  15 minutes   Vasopnuematic Location  --  Right knee.   Vasopneumatic Pressure Medium     Manual Therapy   Passive ROM In supine PROM into right knee flexion and extension x 14 minutes which included low load long duration stretching technique.                  PT Short Term Goals - 09/19/16 1316      PT SHORT TERM GOAL #1   Title Independent with an initial HEP.   Time 2   Period Weeks   Status Achieved     PT SHORT TERM GOAL #2   Title Full active right knee extension.   Time 2   Period Weeks   Status On-going  AROM R knee ext -3 deg 09/19/2016  PT Long Term Goals - 09/19/16 1315      PT LONG TERM GOAL #1   Title Independent with an advanced HEP.   Time 6   Period Weeks   Status On-going     PT LONG TERM GOAL #2   Title Active right knee flexion to 115 degrees+ so the patient can perform functional tasks and do so with pain not > 2-3/10.   Time 6   Period Weeks   Status Achieved  AROM R kne flexion 117 deg 09/19/2016     PT LONG TERM GOAL #3   Title Decrease edema to within 3 cms of non-affected side to assist with pain reduction and range of motion gains.   Time 6   Period Weeks   Status On-going  R knee > L knee with 6 cm difference 09/17/2016     PT LONG TERM GOAL #4   Title Perform a reciprocating stair gait with one railing with pain not > 2-3/10.   Time 6   Period Weeks   Status On-going             Patient will benefit from skilled therapeutic intervention in order to improve the following deficits and impairments:     Visit Diagnosis: Chronic pain of right knee  Stiffness of right knee, not elsewhere  classified  Localized edema     Problem List Patient Active Problem List   Diagnosis Date Noted  . Primary localized osteoarthritis of right knee 08/26/2016  . Primary osteoarthritis of right knee 08/25/2016  . Status post revision of total hip 04/24/2016  . ED (erectile dysfunction) of organic origin 07/01/2012  . Malignant neoplasm of prostate ([place]) 07/01/2012  . Microscopic hematuria 07/01/2012  . Cervical stenosis of spinal canal 01/10/2012    Denette Hass, [place] MPT 09/23/2016, 1:01 PM  [facility] [address] [place], [place], [address] Phone: [phone]   Fax:  [phone]  Name: Jonathan Harvey MRN: [id number] Date of Birth: 1945-08-22

## 2016-09-26 ENCOUNTER — Ambulatory Visit: Payer: Medicare Other | Admitting: Physical Therapy

## 2016-09-26 ENCOUNTER — Encounter: Payer: Self-pay | Admitting: Physical Therapy

## 2016-09-26 DIAGNOSIS — M25661 Stiffness of right knee, not elsewhere classified: Secondary | ICD-10-CM

## 2016-09-26 DIAGNOSIS — M6281 Muscle weakness (generalized): Secondary | ICD-10-CM

## 2016-09-26 DIAGNOSIS — M25561 Pain in right knee: Principal | ICD-10-CM

## 2016-09-26 DIAGNOSIS — G8929 Other chronic pain: Secondary | ICD-10-CM

## 2016-09-26 DIAGNOSIS — R600 Localized edema: Secondary | ICD-10-CM

## 2016-09-26 NOTE — Therapy (Signed)
[facility] [place], [place], [address] Phone: [phone]   Fax:  [phone]  Physical Therapy Treatment  Patient Details  Name: SCHAWN BYAS MRN: [id number] Date of Birth: 03/18/1945 Referring Provider: [name] MD  Encounter Date: 09/26/2016      PT End of Session - 09/26/16 1208    Visit Number 9   Number of Visits 16   Date for PT Re-Evaluation 11/01/16   Authorization Type KX modifier after 15 visit   PT Start Time 1204   PT Stop Time 1301   PT Time Calculation (min) 57 min   Activity Tolerance Patient tolerated treatment well   Behavior During Therapy [facility] for tasks assessed/performed      Past Medical History:  Diagnosis Date  . Arthritis   . Hepatitis    Hepatitis B  2008  . History of kidney stones   . Hypertension   . Prostate cancer [facility])    s/p robotic prostatectomy 10/2006    Past Surgical History:  Procedure Laterality Date  . BREAST LUMPECTOMY Left    lump remove from left arm  . CARPAL TUNNEL RELEASE Right 2010  . curved femur Left   . HAND SURGERY Right 1987   right thumb and index finger  . HERNIA REPAIR  2008   inguineal  . JOINT REPLACEMENT Left 2002   hip replacement  . MASTOIDECTOMY  1982  . radical prostectomy    . TOTAL HIP REVISION Left 04/24/2016   Procedure: TOTAL HIP REVISION;  Surgeon: [name], MD;  Location: [place];  Service: Orthopedics;  Laterality: Left;  . TOTAL KNEE ARTHROPLASTY Right 08/26/2016   Procedure: RIGHT TOTAL KNEE ARTHROPLASTY;  Surgeon: [name], MD;  Location: [place];  Service: Orthopedics;  Laterality: Right;    There were no vitals filed for this visit.      Subjective Assessment - 09/26/16 1207    Subjective Now having more problems out of low back and sciatic nerve.   Pertinent History Right total hip replacement.  Long h/o right knee pain.   Limitations Walking;House hold activities;Standing   How long can you sit comfortably?  unlimited   How long can you stand comfortably? 10 minutes   How long can you walk comfortably? house hold distance using his straight cane   Patient Stated Goals Walk better   Currently in Pain? Yes   Pain Score 2    Pain Location Knee   Pain Orientation Right   Pain Descriptors / Indicators Discomfort   Pain Type Surgical pain   Pain Onset 1 to 4 weeks ago            [facility] PT Assessment - 09/26/16 0001      Assessment   Medical Diagnosis Right total knee replacement.   Onset Date/Surgical Date 08/26/16   Hand Dominance Right   Next MD Visit 10/2016   Prior Therapy yes for left knee pre-op preparation,     Restrictions   Weight Bearing Restrictions No     Observation/Other Assessments-Edema    Edema Circumferential     Circumferential Edema   Circumferential - Right 43.7 cm   Circumferential - Left  40.7 cm     ROM / Strength   AROM / PROM / Strength AROM     AROM   Overall AROM  Within functional limits for tasks performed;Deficits   AROM Assessment Site Knee   Right/Left Knee Right   Right Knee Extension 0  Right Knee Flexion 119                     [facility] Adult PT Treatment/Exercise - 09/26/16 0001      Knee/Hip Exercises: Aerobic   Nustep L5, seat 10 x10 min     Knee/Hip Exercises: Standing   Forward Lunges Right;2 sets;10 reps;5 seconds   Hip Abduction AROM;Right;2 sets;10 reps;Knee straight     Knee/Hip Exercises: Supine   Straight Leg Raises AROM;Right;2 sets;10 reps   Straight Leg Raise with External Rotation AROM;Right;2 sets;10 reps     Modalities   Modalities Teacher, early years/pre Action IFC   Electrical Stimulation Parameters 1-10 hz x15 min   Electrical Stimulation Goals Edema;Pain     Vasopneumatic   Number Minutes Vasopneumatic  15 minutes   Vasopnuematic Location  Knee   Vasopneumatic Pressure Medium    Vasopneumatic Temperature  34     Manual Therapy   Manual Therapy Soft tissue mobilization   Soft tissue mobilization R incision mobilizations to avoid scab immobility and improve scarring in inferior region                  PT Short Term Goals - 09/26/16 1331      PT SHORT TERM GOAL #1   Title Independent with an initial HEP.   Time 2   Period Weeks   Status Achieved     PT SHORT TERM GOAL #2   Title Full active right knee extension.   Time 2   Period Weeks   Status Achieved  Full AROM R knee extension 09/26/2016           PT Long Term Goals - 09/26/16 1332      PT LONG TERM GOAL #1   Title Independent with an advanced HEP.   Time 6   Period Weeks   Status On-going     PT LONG TERM GOAL #2   Title Active right knee flexion to 115 degrees+ so the patient can perform functional tasks and do so with pain not > 2-3/10.   Time 6   Period Weeks   Status Achieved  AROM R kne flexion 119 deg 09/26/2016     PT LONG TERM GOAL #3   Title Decrease edema to within 3 cms of non-affected side to assist with pain reduction and range of motion gains.   Time 6   Period Weeks   Status Achieved  R knee > L knee with 3 cm difference 09/26/2016     PT LONG TERM GOAL #4   Title Perform a reciprocating stair gait with one railing with pain not > 2-3/10.   Time 6   Period Weeks   Status On-going               Plan - 09/26/16 1253    Clinical Impression Statement Patient continues to improve and progress towards goals following R TKR. Patient able to progress through machine strengthening today with only complaint of fatigue with quad strengthening. AROM of R knee measured as 0-119 deg today in supine. Keloid scarring observed in inferior R knee incision but no abnormal scar mobility. Patient experienced intermittant R ITB tightness to which patient was instructed in both supine and sidelying ITb stretch to try at home. Normal modalities response noted following  removal of the modalities.   Rehab Potential Excellent   Clinical Impairments  Affecting Rehab Potential Right knee flexion contracture.   PT Frequency 3x / week   PT Duration 6 weeks   PT Treatment/Interventions ADLs/Self Care Home Management;Cryotherapy;Electrical Stimulation;Functional mobility training;Stair training;Gait training;Therapeutic activities;Therapeutic exercise;Patient/family education;Passive range of motion;Manual techniques;Vasopneumatic Device   PT Next Visit Plan Total knee protocol.  Vasopneumatic   Consulted and Agree with Plan of Care Patient      Patient will benefit from skilled therapeutic intervention in order to improve the following deficits and impairments:  Abnormal gait, Decreased activity tolerance, Decreased mobility, Decreased range of motion, Decreased strength, Increased edema, Pain  Visit Diagnosis: Chronic pain of right knee  Stiffness of right knee, not elsewhere classified  Localized edema  Muscle weakness (generalized)     Problem List Patient Active Problem List   Diagnosis Date Noted  . Primary localized osteoarthritis of right knee 08/26/2016  . Primary osteoarthritis of right knee 08/25/2016  . Status post revision of total hip 04/24/2016  . ED (erectile dysfunction) of organic origin 07/01/2012  . Malignant neoplasm of prostate ([place]) 07/01/2012  . Microscopic hematuria 07/01/2012  . Cervical stenosis of spinal canal 01/10/2012    Wynelle Fanny, PTA 09/26/2016, 1:32 PM  [facility] [place], [place], [address] Phone: [phone]   Fax:  [phone]  Name: DEMETRICK EICHENBERGER MRN: [id number] Date of Birth: Mar 02, 1946

## 2016-10-01 ENCOUNTER — Ambulatory Visit: Payer: Medicare Other | Admitting: *Deleted

## 2016-10-01 DIAGNOSIS — M6281 Muscle weakness (generalized): Secondary | ICD-10-CM

## 2016-10-01 DIAGNOSIS — M25561 Pain in right knee: Principal | ICD-10-CM

## 2016-10-01 DIAGNOSIS — G8929 Other chronic pain: Secondary | ICD-10-CM

## 2016-10-01 DIAGNOSIS — R262 Difficulty in walking, not elsewhere classified: Secondary | ICD-10-CM

## 2016-10-01 DIAGNOSIS — M25552 Pain in left hip: Secondary | ICD-10-CM

## 2016-10-01 DIAGNOSIS — R2689 Other abnormalities of gait and mobility: Secondary | ICD-10-CM

## 2016-10-01 DIAGNOSIS — R600 Localized edema: Secondary | ICD-10-CM

## 2016-10-01 DIAGNOSIS — M25661 Stiffness of right knee, not elsewhere classified: Secondary | ICD-10-CM

## 2016-10-01 NOTE — Therapy (Signed)
[facility] [place], [place], [address] Phone: [phone]   Fax:  [phone]  Physical Therapy Treatment  Patient Details  Name: Jonathan Harvey MRN: [id number] Date of Birth: 17-Sep-1945 Referring Provider: [name] MD  Encounter Date: 10/01/2016      PT End of Session - 10/01/16 1407    Visit Number 10   Number of Visits 16   Date for PT Re-Evaluation 11/01/16   Authorization Type KX modifier after 15 visit   PT Start Time 1345   PT Stop Time 1445   PT Time Calculation (min) 60 min      Past Medical History:  Diagnosis Date  . Arthritis   . Hepatitis    Hepatitis B  2008  . History of kidney stones   . Hypertension   . Prostate cancer [facility])    s/p robotic prostatectomy 10/2006    Past Surgical History:  Procedure Laterality Date  . BREAST LUMPECTOMY Left    lump remove from left arm  . CARPAL TUNNEL RELEASE Right 2010  . curved femur Left   . HAND SURGERY Right 1987   right thumb and index finger  . HERNIA REPAIR  2008   inguineal  . JOINT REPLACEMENT Left 2002   hip replacement  . MASTOIDECTOMY  1982  . radical prostectomy    . TOTAL HIP REVISION Left 04/24/2016   Procedure: TOTAL HIP REVISION;  Surgeon: [name], MD;  Location: [place];  Service: Orthopedics;  Laterality: Left;  . TOTAL KNEE ARTHROPLASTY Right 08/26/2016   Procedure: RIGHT TOTAL KNEE ARTHROPLASTY;  Surgeon: [name], MD;  Location: [place];  Service: Orthopedics;  Laterality: Right;    There were no vitals filed for this visit.      Subjective Assessment - 10/01/16 1406    Subjective Now having more problems out of low back and sciatic nerve.  The knee is doing good.   Pertinent History Right total hip replacement.  Long h/o right knee pain.   Limitations Walking;House hold activities;Standing   How long can you sit comfortably? unlimited   How long can you stand comfortably? 10 minutes   How long can you walk comfortably?  house hold distance using his straight cane   Patient Stated Goals Walk better   Currently in Pain? Yes   Pain Score 2    Pain Location Knee   Pain Orientation Right   Pain Descriptors / Indicators Discomfort   Pain Type Surgical pain   Pain Onset 1 to 4 weeks ago                         [facility] Adult PT Treatment/Exercise - 10/01/16 0001      Knee/Hip Exercises: Aerobic   Nustep L57-8, seat 10 x  16 min     Knee/Hip Exercises: Standing   Forward Lunges Right;2 sets;10 reps;5 seconds   Hip Abduction AROM;Right;2 sets;10 reps;Knee straight   SLS SLS in // bars x 5 mins very challenging without UE assist.     Modalities   Modalities Electrical Stimulation;Vasopneumatic     Electrical Stimulation   Electrical Stimulation Location R knee IFC 1-10 hz x 15 mins   Electrical Stimulation Goals Edema;Pain     Vasopneumatic   Number Minutes Vasopneumatic  15 minutes   Vasopnuematic Location  Knee   Vasopneumatic Pressure Medium   Vasopneumatic Temperature  34     Manual Therapy   Passive ROM  In supine PROM into right knee flexion and extension which included low load long duration stretching technique.                  PT Short Term Goals - 09/26/16 1331      PT SHORT TERM GOAL #1   Title Independent with an initial HEP.   Time 2   Period Weeks   Status Achieved     PT SHORT TERM GOAL #2   Title Full active right knee extension.   Time 2   Period Weeks   Status Achieved  Full AROM R knee extension 09/26/2016           PT Long Term Goals - 09/26/16 1332      PT LONG TERM GOAL #1   Title Independent with an advanced HEP.   Time 6   Period Weeks   Status On-going     PT LONG TERM GOAL #2   Title Active right knee flexion to 115 degrees+ so the patient can perform functional tasks and do so with pain not > 2-3/10.   Time 6   Period Weeks   Status Achieved  AROM R kne flexion 119 deg 09/26/2016     PT LONG TERM GOAL #3   Title  Decrease edema to within 3 cms of non-affected side to assist with pain reduction and range of motion gains.   Time 6   Period Weeks   Status Achieved  R knee > L knee with 3 cm difference 09/26/2016     PT LONG TERM GOAL #4   Title Perform a reciprocating stair gait with one railing with pain not > 2-3/10.   Time 6   Period Weeks   Status On-going               Plan - 10-16-2016 1503    Clinical Impression Statement Pt arrived today doing fairly well with RT knee. He was able to perform all therex with complaints, but was chllenged with SLS still. He was unable to hold for longer than 5 secs without UE assist.      Patient will benefit from skilled therapeutic intervention in order to improve the following deficits and impairments:     Visit Diagnosis: Chronic pain of right knee  Stiffness of right knee, not elsewhere classified  Localized edema  Muscle weakness (generalized)  Difficulty in walking, not elsewhere classified  Pain in left hip  Other abnormalities of gait and mobility       G-Codes - 2016/10/16 1511    Functional Assessment Tool Used (Outpatient Only) 10th visit  FOTO 45% limitation      Problem List Patient Active Problem List   Diagnosis Date Noted  . Primary localized osteoarthritis of right knee 08/26/2016  . Primary osteoarthritis of right knee 08/25/2016  . Status post revision of total hip 04/24/2016  . ED (erectile dysfunction) of organic origin 07/01/2012  . Malignant neoplasm of prostate ([place]) 07/01/2012  . Microscopic hematuria 07/01/2012  . Cervical stenosis of spinal canal 01/10/2012    RAMSEUR,[name] , PTA 10/16/16, 5:51 PM  [facility] [place], [place], [address] Phone: [phone]   Fax:  [phone]  Name: Jonathan Harvey MRN: [id number] Date of Birth: 1946/01/06

## 2016-10-04 ENCOUNTER — Ambulatory Visit: Payer: Medicare Other | Attending: Orthopedic Surgery | Admitting: Physical Therapy

## 2016-10-04 ENCOUNTER — Encounter: Payer: Self-pay | Admitting: Physical Therapy

## 2016-10-04 DIAGNOSIS — R600 Localized edema: Secondary | ICD-10-CM | POA: Diagnosis present

## 2016-10-04 DIAGNOSIS — M25661 Stiffness of right knee, not elsewhere classified: Secondary | ICD-10-CM | POA: Insufficient documentation

## 2016-10-04 DIAGNOSIS — M6281 Muscle weakness (generalized): Secondary | ICD-10-CM | POA: Insufficient documentation

## 2016-10-04 DIAGNOSIS — G8929 Other chronic pain: Secondary | ICD-10-CM | POA: Diagnosis present

## 2016-10-04 DIAGNOSIS — M25561 Pain in right knee: Secondary | ICD-10-CM | POA: Diagnosis present

## 2016-10-04 DIAGNOSIS — R262 Difficulty in walking, not elsewhere classified: Secondary | ICD-10-CM | POA: Diagnosis present

## 2016-10-04 NOTE — Therapy (Signed)
[facility] [place], [place], [address] Phone: [phone]   Fax:  [phone]  Physical Therapy Treatment  Patient Details  Name: Jonathan Harvey MRN: [id number] Date of Birth: 06/08/1945 Referring Provider: [name] MD  Encounter Date: 10/04/2016      PT End of Session - 10/04/16 1117    Visit Number 11   Number of Visits 16   Date for PT Re-Evaluation 11/01/16   Authorization Type KX modifier after 15 visit   PT Start Time 1116   PT Stop Time 1209   PT Time Calculation (min) 53 min   Activity Tolerance Patient tolerated treatment well   Behavior During Therapy [facility] for tasks assessed/performed      Past Medical History:  Diagnosis Date  . Arthritis   . Hepatitis    Hepatitis B  2008  . History of kidney stones   . Hypertension   . Prostate cancer [facility])    s/p robotic prostatectomy 10/2006    Past Surgical History:  Procedure Laterality Date  . BREAST LUMPECTOMY Left    lump remove from left arm  . CARPAL TUNNEL RELEASE Right 2010  . curved femur Left   . HAND SURGERY Right 1987   right thumb and index finger  . HERNIA REPAIR  2008   inguineal  . JOINT REPLACEMENT Left 2002   hip replacement  . MASTOIDECTOMY  1982  . radical prostectomy    . TOTAL HIP REVISION Left 04/24/2016   Procedure: TOTAL HIP REVISION;  Surgeon: [name], MD;  Location: [place];  Service: Orthopedics;  Laterality: Left;  . TOTAL KNEE ARTHROPLASTY Right 08/26/2016   Procedure: RIGHT TOTAL KNEE ARTHROPLASTY;  Surgeon: [name], MD;  Location: [place];  Service: Orthopedics;  Laterality: Right;    There were no vitals filed for this visit.      Subjective Assessment - 10/04/16 1116    Subjective Reports that he is still unable to complete stationary bike due to R knee pain.   Pertinent History Right total hip replacement.  Long h/o right knee pain.   Limitations Walking;House hold activities;Standing   How long can you sit  comfortably? unlimited   How long can you stand comfortably? 10 minutes   How long can you walk comfortably? house hold distance using his straight cane   Patient Stated Goals Walk better   Currently in Pain? No/denies            [facility] PT Assessment - 10/04/16 0001      Assessment   Medical Diagnosis Right total knee replacement.   Onset Date/Surgical Date 08/26/16   Hand Dominance Right   Next MD Visit 10/08/2016   Prior Therapy yes for left knee pre-op preparation,     Restrictions   Weight Bearing Restrictions No     ROM / Strength   AROM / PROM / Strength AROM     AROM   Overall AROM  Within functional limits for tasks performed;Deficits   AROM Assessment Site Knee   Right/Left Knee Right   Right Knee Extension 0   Right Knee Flexion 121                     [facility] Adult PT Treatment/Exercise - 10/04/16 0001      Knee/Hip Exercises: Machines for Strengthening   Cybex Knee Extension 10# 3x10 reps  Greater L knee discomfort   Cybex Knee Flexion 30# 6x10 reps   Cybex Leg  Press 2 pl. seat 6, 3x10 reps     Knee/Hip Exercises: Standing   Terminal Knee Extension Limitations Pink XTS RLE x30 rpes   Forward Step Up Both;2 sets;10 reps;Hand Hold: 2;Step Height: 8"     Knee/Hip Exercises: Supine   Straight Leg Raises AROM;Right;3 sets;10 reps   Straight Leg Raise with External Rotation AROM;Right;2 sets;10 reps     Modalities   Modalities Teacher, early years/pre Action IFC   Electrical Stimulation Parameters 1-10 hz x15 min   Electrical Stimulation Goals Edema;Pain     Vasopneumatic   Number Minutes Vasopneumatic  15 minutes   Vasopnuematic Location  Knee   Vasopneumatic Pressure Medium   Vasopneumatic Temperature  52                  PT Short Term Goals - 09/26/16 1331      PT SHORT TERM GOAL #1   Title Independent with an initial  HEP.   Time 2   Period Weeks   Status Achieved     PT SHORT TERM GOAL #2   Title Full active right knee extension.   Time 2   Period Weeks   Status Achieved  Full AROM R knee extension 09/26/2016           PT Long Term Goals - 09/26/16 1332      PT LONG TERM GOAL #1   Title Independent with an advanced HEP.   Time 6   Period Weeks   Status On-going     PT LONG TERM GOAL #2   Title Active right knee flexion to 115 degrees+ so the patient can perform functional tasks and do so with pain not > 2-3/10.   Time 6   Period Weeks   Status Achieved  AROM R kne flexion 119 deg 09/26/2016     PT LONG TERM GOAL #3   Title Decrease edema to within 3 cms of non-affected side to assist with pain reduction and range of motion gains.   Time 6   Period Weeks   Status Achieved  R knee > L knee with 3 cm difference 09/26/2016     PT LONG TERM GOAL #4   Title Perform a reciprocating stair gait with one railing with pain not > 2-3/10.   Time 6   Period Weeks   Status On-going               Plan - 10/04/16 1201    Clinical Impression Statement Patient continues to tolerate treatment well with only instance of pain with machine R knee extension. No aerobic warm up today secondary to previous warm up session this morning. Patient continues to require minimal cueing for proper TKE and forward step up form and sequence. AROM R knee measuared as 0-121 deg thus meeting ROM goals. Normal modalities response noted following removal of the modalities.   Rehab Potential Excellent   Clinical Impairments Affecting Rehab Potential Right knee flexion contracture.   PT Frequency 3x / week   PT Duration 6 weeks   PT Treatment/Interventions ADLs/Self Care Home Management;Cryotherapy;Electrical Stimulation;Functional mobility training;Stair training;Gait training;Therapeutic activities;Therapeutic exercise;Patient/family education;Passive range of motion;Manual techniques;Vasopneumatic Device   PT  Next Visit Plan Total knee protocol.  Vasopneumatic   Consulted and Agree with Plan of Care Patient      Patient will benefit from skilled therapeutic intervention in order to improve the following deficits  and impairments:  Abnormal gait, Decreased activity tolerance, Decreased mobility, Decreased range of motion, Decreased strength, Increased edema, Pain  Visit Diagnosis: Chronic pain of right knee  Stiffness of right knee, not elsewhere classified  Localized edema     Problem List Patient Active Problem List   Diagnosis Date Noted  . Primary localized osteoarthritis of right knee 08/26/2016  . Primary osteoarthritis of right knee 08/25/2016  . Status post revision of total hip 04/24/2016  . ED (erectile dysfunction) of organic origin 07/01/2012  . Malignant neoplasm of prostate ([place]) 07/01/2012  . Microscopic hematuria 07/01/2012  . Cervical stenosis of spinal canal 01/10/2012    Wynelle Fanny, PTA 10/04/2016, 12:13 PM  [facility] [address] [place], [place], [address] Phone: [phone]   Fax:  [phone]  Name: Jonathan Harvey MRN: [id number] Date of Birth: 08-Apr-1945

## 2016-10-10 ENCOUNTER — Ambulatory Visit: Payer: Medicare Other | Admitting: *Deleted

## 2016-10-10 DIAGNOSIS — M25561 Pain in right knee: Secondary | ICD-10-CM | POA: Diagnosis not present

## 2016-10-10 DIAGNOSIS — R262 Difficulty in walking, not elsewhere classified: Secondary | ICD-10-CM

## 2016-10-10 DIAGNOSIS — M6281 Muscle weakness (generalized): Secondary | ICD-10-CM

## 2016-10-10 DIAGNOSIS — M25661 Stiffness of right knee, not elsewhere classified: Secondary | ICD-10-CM

## 2016-10-10 DIAGNOSIS — G8929 Other chronic pain: Secondary | ICD-10-CM

## 2016-10-10 DIAGNOSIS — R600 Localized edema: Secondary | ICD-10-CM

## 2016-10-10 NOTE — Therapy (Signed)
[facility] [place], [place], [address] Phone: [phone]   Fax:  [phone]  Physical Therapy Treatment Discharge Summary  Patient Details  Name: Jonathan Harvey MRN: [id number] Date of Birth: [birth year] Referring Provider: [name] MD  Encounter Date: 10/10/2016      PT End of Session - 10/10/16 1118    Visit Number 12   Number of Visits 16   Date for PT Re-Evaluation 11/01/16   Authorization Type KX modifier after 15 visit   PT Start Time 1110   PT Stop Time 1027   PT Time Calculation (min) 54 min      Past Medical History:  Diagnosis Date  . Arthritis   . Hepatitis    Hepatitis B  2008  . History of kidney stones   . Hypertension   . Prostate cancer [facility])    s/p robotic prostatectomy 10/2006    Past Surgical History:  Procedure Laterality Date  . BREAST LUMPECTOMY Left    lump remove from left arm  . CARPAL TUNNEL RELEASE Right 2010  . curved femur Left   . HAND SURGERY Right 1987   right thumb and index finger  . HERNIA REPAIR  2008   inguineal  . JOINT REPLACEMENT Left 2002   hip replacement  . MASTOIDECTOMY  1982  . radical prostectomy    . TOTAL HIP REVISION Left 04/24/2016   Procedure: TOTAL HIP REVISION;  Surgeon: [name], MD;  Location: [place];  Service: Orthopedics;  Laterality: Left;  . TOTAL KNEE ARTHROPLASTY Right 08/26/2016   Procedure: RIGHT TOTAL KNEE ARTHROPLASTY;  Surgeon: [name], MD;  Location: [place];  Service: Orthopedics;  Laterality: Right;    There were no vitals filed for this visit.                       [place] Adult PT Treatment/Exercise - 10/10/16 0001      Knee/Hip Exercises: Machines for Strengthening   Cybex Knee Extension 10# 3x10 reps   Cybex Knee Flexion 30# 6x10 reps   Cybex Leg Press 2 pl. seat 6, 3x10 reps     Knee/Hip Exercises: Standing   SLS SLS in // bars x 5 mins very challenging without UE assist.   Other Standing Knee  Exercises up and down steps with hand railing     Modalities   Modalities Electrical Stimulation;Vasopneumatic     Electrical Stimulation   Electrical Stimulation Location R knee IFC 1-10 hz x 15 mins   Electrical Stimulation Goals Edema;Pain     Vasopneumatic   Number Minutes Vasopneumatic  15 minutes   Vasopnuematic Location  Knee   Vasopneumatic Pressure Medium   Vasopneumatic Temperature  36                  PT Short Term Goals - 09/26/16 1331      PT SHORT TERM GOAL #1   Title Independent with an initial HEP.   Time 2   Period Weeks   Status Achieved     PT SHORT TERM GOAL #2   Title Full active right knee extension.   Time 2   Period Weeks   Status Achieved  Full AROM R knee extension 09/26/2016           PT Long Term Goals - 10/10/16 1129      PT LONG TERM GOAL #1   Title Independent with an advanced HEP.   Period Weeks  Status Achieved     PT LONG TERM GOAL #2   Title Active right knee flexion to 115 degrees+ so the patient can perform functional tasks and do so with pain not > 2-3/10.   Baseline pain with short distance amb using a straight cane   Period Weeks   Status Achieved     PT LONG TERM GOAL #3   Title Decrease edema to within 3 cms of non-affected side to assist with pain reduction and range of motion gains.   Time 6   Period Weeks   Status Achieved     PT LONG TERM GOAL #4   Title Perform a reciprocating stair gait with one railing with pain not > 2-3/10.   Time 6   Period Weeks   Status Achieved               Plan - 11-08-2016 1128    Clinical Impression Statement Pt arrived to clinic with a good report from MD and is ready for DC after today. ROM 0-122 degrees today. ALL LTGs MET   Clinical Presentation Stable   Clinical Decision Making Low   Rehab Potential Excellent   Clinical Impairments Affecting Rehab Potential Right knee flexion contracture.   PT Frequency 3x / week   PT Duration 6 weeks   PT  Treatment/Interventions ADLs/Self Care Home Management;Cryotherapy;Electrical Stimulation;Functional mobility training;Stair training;Gait training;Therapeutic activities;Therapeutic exercise;Patient/family education;Passive range of motion;Manual techniques;Vasopneumatic Device   PT Next Visit Plan  DC to HEP and Gym program   PT Home Exercise Plan establish HEP next treatment   Consulted and Agree with Plan of Care Patient      Patient will benefit from skilled therapeutic intervention in order to improve the following deficits and impairments:  Abnormal gait, Decreased activity tolerance, Decreased mobility, Decreased range of motion, Decreased strength, Increased edema, Pain  Visit Diagnosis: Chronic pain of right knee  Stiffness of right knee, not elsewhere classified  Localized edema  Muscle weakness (generalized)  Difficulty in walking, not elsewhere classified  PHYSICAL THERAPY DISCHARGE SUMMARY  Visits from Start of Care: 12  Current functional level related to goals / functional outcomes: All goals met   Remaining deficits: See above   Education / Equipment: HEP Plan: Patient agrees to discharge.  Patient goals were met. Patient is being discharged due to meeting the stated rehab goals.  ?????           G-Codes - 11/08/2016 03-Jun-1200    Functional Assessment Tool Used (Outpatient Only) 12th visit FOTO Gcode 36% limitation      Problem List Patient Active Problem List   Diagnosis Date Noted  . Primary localized osteoarthritis of right knee 08/26/2016  . Primary osteoarthritis of right knee 08/25/2016  . Status post revision of total hip 04/24/2016  . ED (erectile dysfunction) of organic origin 07/01/2012  . Malignant neoplasm of prostate ([place]) 07/01/2012  . Microscopic hematuria 07/01/2012  . Cervical stenosis of spinal canal 01/10/2012    RAMSEUR,[name], PTA 2016-11-08, 2:30 PM  Kearney Hard, PT 10/11/16 12:58 PM    [facility] [place], [place], [address] Phone: [phone]   Fax:  [phone]  Name: WAEL MAESTAS MRN: [id number] Date of Birth: 1945-12-31

## 2016-11-21 ENCOUNTER — Other Ambulatory Visit: Payer: Self-pay | Admitting: Urology

## 2016-12-24 ENCOUNTER — Encounter (HOSPITAL_COMMUNITY): Payer: Self-pay | Admitting: *Deleted

## 2016-12-24 ENCOUNTER — Encounter (INDEPENDENT_AMBULATORY_CARE_PROVIDER_SITE_OTHER): Payer: Self-pay

## 2016-12-24 NOTE — Pre-Procedure Instructions (Addendum)
Patient requested that I send preop instructions through [facility]. Below instructions sent as a MyChart message. Patient denied chest pain/ shortness of breath.   Jonathan Harvey  12/24/2016     Your procedure is scheduled on October 24  Report to [facility] Admitting at 12:45 P.M.  Call this number if you have problems the morning of surgery:  [phone]   Remember:  Do not eat food or drink liquids after midnight.  Take these medicines the morning of surgery with A SIP OF WATER: Zyrtec, Gabapentin, Metoprolol, Oxycodone/ Roxicet (if needed), Zanaflex (if needed), Ultram (if needed)   Do not wear jewelry, make-up or nail polish.  Do not wear lotions, powders, or perfumes, or deoderant.  Do not bring valuables to the hospital.  [facility] is not responsible for any belongings or valuables.  Contacts, dentures or bridgework may not be worn into surgery.  Leave your suitcase in the car.  After surgery it may be brought to your room.  For patients admitted to the hospital, discharge time will be determined by your treatment team.  Patients discharged the day of surgery will not be allowed to drive home.

## 2016-12-25 ENCOUNTER — Encounter (HOSPITAL_COMMUNITY): Payer: Self-pay | Admitting: *Deleted

## 2016-12-25 ENCOUNTER — Inpatient Hospital Stay (HOSPITAL_COMMUNITY): Payer: Medicare Other | Admitting: Certified Registered"

## 2016-12-25 ENCOUNTER — Inpatient Hospital Stay (HOSPITAL_COMMUNITY)
Admission: RE | Admit: 2016-12-25 | Discharge: 2016-12-27 | DRG: 466 | Disposition: A | Discharge status: Home Health | Payer: Medicare Other | Source: Ambulatory Visit | Attending: Orthopedic Surgery | Admitting: Orthopedic Surgery

## 2016-12-25 ENCOUNTER — Other Ambulatory Visit: Payer: Self-pay | Admitting: Orthopedic Surgery

## 2016-12-25 ENCOUNTER — Encounter (HOSPITAL_COMMUNITY)
Admission: RE | Disposition: A | Discharge status: Home Health | Payer: Self-pay | Source: Ambulatory Visit | Attending: Orthopedic Surgery

## 2016-12-25 DIAGNOSIS — Z8546 Personal history of malignant neoplasm of prostate: Secondary | ICD-10-CM

## 2016-12-25 DIAGNOSIS — X58XXXA Exposure to other specified factors, initial encounter: Secondary | ICD-10-CM | POA: Diagnosis present

## 2016-12-25 DIAGNOSIS — S72112A Displaced fracture of greater trochanter of left femur, initial encounter for closed fracture: Secondary | ICD-10-CM | POA: Diagnosis present

## 2016-12-25 DIAGNOSIS — Z96651 Presence of right artificial knee joint: Secondary | ICD-10-CM | POA: Diagnosis present

## 2016-12-25 DIAGNOSIS — D62 Acute posthemorrhagic anemia: Secondary | ICD-10-CM | POA: Diagnosis not present

## 2016-12-25 DIAGNOSIS — Z85528 Personal history of other malignant neoplasm of kidney: Secondary | ICD-10-CM

## 2016-12-25 DIAGNOSIS — I10 Essential (primary) hypertension: Secondary | ICD-10-CM | POA: Diagnosis present

## 2016-12-25 DIAGNOSIS — Y792 Prosthetic and other implants, materials and accessory orthopedic devices associated with adverse incidents: Secondary | ICD-10-CM | POA: Diagnosis present

## 2016-12-25 DIAGNOSIS — Z96649 Presence of unspecified artificial hip joint: Secondary | ICD-10-CM

## 2016-12-25 DIAGNOSIS — T84031A Mechanical loosening of internal left hip prosthetic joint, initial encounter: Secondary | ICD-10-CM | POA: Diagnosis present

## 2016-12-25 DIAGNOSIS — E781 Pure hyperglyceridemia: Secondary | ICD-10-CM | POA: Diagnosis present

## 2016-12-25 DIAGNOSIS — M1612 Unilateral primary osteoarthritis, left hip: Secondary | ICD-10-CM | POA: Diagnosis present

## 2016-12-25 DIAGNOSIS — Z87891 Personal history of nicotine dependence: Secondary | ICD-10-CM

## 2016-12-25 DIAGNOSIS — M25552 Pain in left hip: Secondary | ICD-10-CM | POA: Diagnosis present

## 2016-12-25 DIAGNOSIS — T84018A Broken internal joint prosthesis, other site, initial encounter: Secondary | ICD-10-CM

## 2016-12-25 HISTORY — DX: Pure hyperglyceridemia: E78.1

## 2016-12-25 HISTORY — DX: Low back pain: M54.5

## 2016-12-25 HISTORY — PX: TOTAL HIP REVISION: SHX763

## 2016-12-25 HISTORY — DX: Spinal stenosis, site unspecified: M48.00

## 2016-12-25 HISTORY — DX: Unspecified viral hepatitis B without hepatic coma: B19.10

## 2016-12-25 HISTORY — DX: Other chronic pain: G89.29

## 2016-12-25 HISTORY — DX: Malignant neoplasm of right kidney, except renal pelvis: C64.1

## 2016-12-25 HISTORY — PX: ORIF GREATER TROCHANTERIC FRACTURE: SUR934

## 2016-12-25 HISTORY — DX: Low back pain, unspecified: M54.50

## 2016-12-25 LAB — CBC WITH DIFFERENTIAL/PLATELET
BASOS PCT: 1 %
Basophils Absolute: 0 10*3/uL (ref 0.0–0.1)
EOS ABS: 0.4 10*3/uL (ref 0.0–0.7)
Eosinophils Relative: 6 %
HEMATOCRIT: 37.3 % — AB (ref 39.0–52.0)
HEMOGLOBIN: 12.2 g/dL — AB (ref 13.0–17.0)
Lymphocytes Relative: 32 %
Lymphs Abs: 1.9 10*3/uL (ref 0.7–4.0)
MCH: 29.5 pg (ref 26.0–34.0)
MCHC: 32.7 g/dL (ref 30.0–36.0)
MCV: 90.1 fL (ref 78.0–100.0)
MONOS PCT: 9 %
Monocytes Absolute: 0.6 10*3/uL (ref 0.1–1.0)
NEUTROS ABS: 3.2 10*3/uL (ref 1.7–7.7)
NEUTROS PCT: 52 %
Platelets: 310 10*3/uL (ref 150–400)
RBC: 4.14 MIL/uL — AB (ref 4.22–5.81)
RDW: 14.7 % (ref 11.5–15.5)
WBC: 6 10*3/uL (ref 4.0–10.5)

## 2016-12-25 LAB — URINALYSIS, ROUTINE W REFLEX MICROSCOPIC
Bilirubin Urine: NEGATIVE
Glucose, UA: NEGATIVE mg/dL
HGB URINE DIPSTICK: NEGATIVE
Ketones, ur: NEGATIVE mg/dL
LEUKOCYTES UA: NEGATIVE
Nitrite: NEGATIVE
PROTEIN: NEGATIVE mg/dL
Specific Gravity, Urine: 1.018 (ref 1.005–1.030)
pH: 6 (ref 5.0–8.0)

## 2016-12-25 LAB — BASIC METABOLIC PANEL
Anion gap: 8 (ref 5–15)
BUN: 19 mg/dL (ref 6–20)
CALCIUM: 9.2 mg/dL (ref 8.9–10.3)
CO2: 29 mmol/L (ref 22–32)
CREATININE: 1.26 mg/dL — AB (ref 0.61–1.24)
Chloride: 102 mmol/L (ref 101–111)
GFR calc Af Amer: >60 mL/min (ref >60)
GFR calc non Af Amer: 57 mL/min — ABNORMAL LOW (ref >60)
GLUCOSE: 104 mg/dL — AB (ref 65–99)
Potassium: 3.1 mmol/L — ABNORMAL LOW (ref 3.5–5.1)
Sodium: 139 mmol/L (ref 135–145)

## 2016-12-25 LAB — PROTIME-INR
INR: 1.01
Prothrombin Time: 13.3 seconds (ref 11.4–15.2)

## 2016-12-25 LAB — TYPE AND SCREEN
ABO/RH(D): A POS
Antibody Screen: NEGATIVE

## 2016-12-25 LAB — APTT: aPTT: 31 seconds (ref 24–36)

## 2016-12-25 SURGERY — TOTAL HIP REVISION
Anesthesia: General | Laterality: Left

## 2016-12-25 SURGERY — TOTAL HIP REVISION
Anesthesia: Spinal | Laterality: Left

## 2016-12-25 MED ORDER — METHOCARBAMOL 500 MG PO TABS
500.0000 mg | ORAL_TABLET | Freq: Four times a day (QID) | ORAL | Status: DC | PRN
  Administered 2016-12-25 – 2016-12-26 (×3): 500 mg via ORAL
  Filled 2016-12-25 (×3): qty 1

## 2016-12-25 MED ORDER — EPHEDRINE SULFATE 50 MG/ML IJ SOLN
INTRAMUSCULAR | Status: DC | PRN
  Administered 2016-12-25 (×5): 10 mg via INTRAVENOUS

## 2016-12-25 MED ORDER — ONDANSETRON HCL 4 MG/2ML IJ SOLN
INTRAMUSCULAR | Status: DC | PRN
  Administered 2016-12-25: 4 mg via INTRAVENOUS

## 2016-12-25 MED ORDER — HYDROCODONE-ACETAMINOPHEN 5-325 MG PO TABS
1.0000 | ORAL_TABLET | ORAL | Status: DC | PRN
  Administered 2016-12-26 (×2): 1 via ORAL
  Filled 2016-12-25 (×2): qty 1

## 2016-12-25 MED ORDER — ACETAMINOPHEN 650 MG RE SUPP: 650.0000 mg | RECTAL | Status: DC | PRN

## 2016-12-25 MED ORDER — DIPHENHYDRAMINE HCL 12.5 MG/5ML PO ELIX: 12.5000 mg | ORAL_SOLUTION | ORAL | Status: DC | PRN

## 2016-12-25 MED ORDER — LORATADINE 10 MG PO TABS
10.0000 mg | ORAL_TABLET | Freq: Every day | ORAL | Status: DC
  Administered 2016-12-25 – 2016-12-26 (×2): 10 mg via ORAL
  Filled 2016-12-25 (×2): qty 1

## 2016-12-25 MED ORDER — METOCLOPRAMIDE HCL 5 MG/ML IJ SOLN: 5.0000 mg | Freq: Three times a day (TID) | INTRAMUSCULAR | Status: DC | PRN

## 2016-12-25 MED ORDER — OXYCODONE HCL 5 MG PO TABS
10.0000 mg | ORAL_TABLET | ORAL | Status: DC | PRN
  Administered 2016-12-25 – 2016-12-26 (×3): 10 mg via ORAL
  Filled 2016-12-25 (×3): qty 2

## 2016-12-25 MED ORDER — GABAPENTIN 300 MG PO CAPS: 300.0000 mg | ORAL_CAPSULE | Freq: Two times a day (BID) | ORAL | Status: DC

## 2016-12-25 MED ORDER — ALUMINUM HYDROXIDE GEL 320 MG/5ML PO SUSP
15.0000 mL | ORAL | Status: DC | PRN
  Administered 2016-12-27: 30 mL via ORAL
  Filled 2016-12-25 (×2): qty 30

## 2016-12-25 MED ORDER — METOPROLOL SUCCINATE ER 25 MG PO TB24
25.0000 mg | ORAL_TABLET | Freq: Every day | ORAL | Status: DC
  Administered 2016-12-27: 25 mg via ORAL
  Filled 2016-12-25 (×2): qty 1

## 2016-12-25 MED ORDER — POTASSIUM CHLORIDE CRYS ER 20 MEQ PO TBCR
20.0000 meq | EXTENDED_RELEASE_TABLET | Freq: Two times a day (BID) | ORAL | Status: DC
  Administered 2016-12-25 – 2016-12-27 (×4): 20 meq via ORAL
  Filled 2016-12-25 (×4): qty 1

## 2016-12-25 MED ORDER — ASPIRIN EC 325 MG PO TBEC: 325.0000 mg | DELAYED_RELEASE_TABLET | Freq: Two times a day (BID) | ORAL | 0 refills | Status: DC

## 2016-12-25 MED ORDER — LOSARTAN POTASSIUM 50 MG PO TABS
100.0000 mg | ORAL_TABLET | Freq: Every day | ORAL | Status: DC
  Administered 2016-12-27: 100 mg via ORAL
  Filled 2016-12-25 (×2): qty 2

## 2016-12-25 MED ORDER — METOCLOPRAMIDE HCL 5 MG PO TABS: 5.0000 mg | ORAL_TABLET | Freq: Three times a day (TID) | ORAL | Status: DC | PRN

## 2016-12-25 MED ORDER — SODIUM CHLORIDE 0.9 % IR SOLN
Status: DC | PRN
  Administered 2016-12-25: 3000 mL

## 2016-12-25 MED ORDER — KCL IN DEXTROSE-NACL 20-5-0.45 MEQ/L-%-% IV SOLN
INTRAVENOUS | Status: DC
  Administered 2016-12-26 (×2): via INTRAVENOUS
  Filled 2016-12-25 (×2): qty 1000

## 2016-12-25 MED ORDER — ROCURONIUM BROMIDE 100 MG/10ML IV SOLN
INTRAVENOUS | Status: DC | PRN
  Administered 2016-12-25: 50 mg via INTRAVENOUS

## 2016-12-25 MED ORDER — CELECOXIB 200 MG PO CAPS
200.0000 mg | ORAL_CAPSULE | Freq: Two times a day (BID) | ORAL | Status: DC
  Administered 2016-12-25 – 2016-12-27 (×4): 200 mg via ORAL
  Filled 2016-12-25 (×4): qty 1

## 2016-12-25 MED ORDER — GUAIFENESIN ER 600 MG PO TB12
600.0000 mg | ORAL_TABLET | Freq: Two times a day (BID) | ORAL | Status: DC
  Administered 2016-12-25 – 2016-12-27 (×3): 600 mg via ORAL
  Filled 2016-12-25 (×4): qty 1

## 2016-12-25 MED ORDER — ASPIRIN EC 325 MG PO TBEC
325.0000 mg | DELAYED_RELEASE_TABLET | Freq: Every day | ORAL | Status: DC
  Administered 2016-12-26 – 2016-12-27 (×2): 325 mg via ORAL
  Filled 2016-12-25 (×2): qty 1

## 2016-12-25 MED ORDER — LOSARTAN POTASSIUM-HCTZ 100-25 MG PO TABS: 1.0000 | ORAL_TABLET | Freq: Every day | ORAL | Status: DC

## 2016-12-25 MED ORDER — PHENYLEPHRINE HCL 10 MG/ML IJ SOLN
INTRAVENOUS | Status: DC | PRN
  Administered 2016-12-25: 50 ug/min via INTRAVENOUS

## 2016-12-25 MED ORDER — MENTHOL 3 MG MT LOZG
1.0000 | LOZENGE | OROMUCOSAL | Status: DC | PRN
  Administered 2016-12-25: 3 mg via ORAL
  Filled 2016-12-25: qty 9

## 2016-12-25 MED ORDER — HYDROMORPHONE HCL 1 MG/ML IJ SOLN
INTRAMUSCULAR | Status: AC
  Filled 2016-12-25: qty 1

## 2016-12-25 MED ORDER — DOCUSATE SODIUM 100 MG PO CAPS
100.0000 mg | ORAL_CAPSULE | Freq: Two times a day (BID) | ORAL | Status: DC
  Administered 2016-12-25 – 2016-12-27 (×4): 100 mg via ORAL
  Filled 2016-12-25 (×4): qty 1

## 2016-12-25 MED ORDER — PHENYLEPHRINE HCL 10 MG/ML IJ SOLN
INTRAMUSCULAR | Status: DC | PRN
  Administered 2016-12-25 (×8): 80 ug via INTRAVENOUS

## 2016-12-25 MED ORDER — SUGAMMADEX SODIUM 200 MG/2ML IV SOLN
INTRAVENOUS | Status: DC | PRN
  Administered 2016-12-25: 200 mg via INTRAVENOUS

## 2016-12-25 MED ORDER — PHENOL 1.4 % MT LIQD: 1.0000 | OROMUCOSAL | Status: DC | PRN

## 2016-12-25 MED ORDER — PROPOFOL 10 MG/ML IV BOLUS
INTRAVENOUS | Status: AC
  Filled 2016-12-25: qty 40

## 2016-12-25 MED ORDER — PHENYLEPHRINE 40 MCG/ML (10ML) SYRINGE FOR IV PUSH (FOR BLOOD PRESSURE SUPPORT)
PREFILLED_SYRINGE | INTRAVENOUS | Status: AC
  Filled 2016-12-25: qty 20

## 2016-12-25 MED ORDER — HYDROMORPHONE HCL 1 MG/ML IJ SOLN: 1.0000 mg | INTRAMUSCULAR | Status: DC | PRN

## 2016-12-25 MED ORDER — FUROSEMIDE 20 MG PO TABS
20.0000 mg | ORAL_TABLET | Freq: Every day | ORAL | Status: DC
Start: 2016-12-26 — End: 2016-12-27
  Administered 2016-12-27: 20 mg via ORAL
  Filled 2016-12-25 (×2): qty 1

## 2016-12-25 MED ORDER — HYDROCHLOROTHIAZIDE 25 MG PO TABS
25.0000 mg | ORAL_TABLET | Freq: Every day | ORAL | Status: DC
  Filled 2016-12-25 (×2): qty 1

## 2016-12-25 MED ORDER — BUPIVACAINE-EPINEPHRINE (PF) 0.5% -1:200000 IJ SOLN
INTRAMUSCULAR | Status: AC
  Filled 2016-12-25: qty 30

## 2016-12-25 MED ORDER — FLEET ENEMA 7-19 GM/118ML RE ENEM: 1.0000 | ENEMA | Freq: Once | RECTAL | Status: DC | PRN

## 2016-12-25 MED ORDER — CEFAZOLIN SODIUM-DEXTROSE 2-3 GM-%(50ML) IV SOLR
INTRAVENOUS | Status: DC | PRN
  Administered 2016-12-25: 2 g via INTRAVENOUS

## 2016-12-25 MED ORDER — BUPIVACAINE-EPINEPHRINE 0.5% -1:200000 IJ SOLN
INTRAMUSCULAR | Status: DC | PRN
Start: 2016-12-25 — End: 2016-12-25
  Administered 2016-12-25: 50 mL

## 2016-12-25 MED ORDER — FENTANYL CITRATE (PF) 250 MCG/5ML IJ SOLN
INTRAMUSCULAR | Status: AC
  Filled 2016-12-25: qty 5

## 2016-12-25 MED ORDER — CEFAZOLIN SODIUM-DEXTROSE 2-4 GM/100ML-% IV SOLN: 2.0000 g | Freq: Once | INTRAVENOUS | Status: DC

## 2016-12-25 MED ORDER — SUGAMMADEX SODIUM 200 MG/2ML IV SOLN
INTRAVENOUS | Status: AC
  Filled 2016-12-25: qty 2

## 2016-12-25 MED ORDER — CEFAZOLIN SODIUM 1 G IJ SOLR
INTRAMUSCULAR | Status: AC
  Filled 2016-12-25: qty 20

## 2016-12-25 MED ORDER — TRANEXAMIC ACID 1000 MG/10ML IV SOLN
1000.0000 mg | Freq: Once | INTRAVENOUS | Status: AC
  Administered 2016-12-25: 1000 mg via INTRAVENOUS
  Filled 2016-12-25: qty 10

## 2016-12-25 MED ORDER — PROPOFOL 10 MG/ML IV BOLUS
INTRAVENOUS | Status: DC | PRN
  Administered 2016-12-25: 150 mg via INTRAVENOUS

## 2016-12-25 MED ORDER — LACTATED RINGERS IV SOLN
INTRAVENOUS | Status: DC
  Administered 2016-12-25: 14:00:00 via INTRAVENOUS

## 2016-12-25 MED ORDER — PROMETHAZINE HCL 25 MG/ML IJ SOLN: 6.2500 mg | INTRAMUSCULAR | Status: DC | PRN

## 2016-12-25 MED ORDER — HYDROMORPHONE HCL 1 MG/ML IJ SOLN
0.2500 mg | INTRAMUSCULAR | Status: DC | PRN
  Administered 2016-12-25 (×2): 0.5 mg via INTRAVENOUS

## 2016-12-25 MED ORDER — METHOCARBAMOL 1000 MG/10ML IJ SOLN
500.0000 mg | Freq: Four times a day (QID) | INTRAVENOUS | Status: DC | PRN
  Filled 2016-12-25: qty 5

## 2016-12-25 MED ORDER — ACETAMINOPHEN 325 MG PO TABS
650.0000 mg | ORAL_TABLET | ORAL | Status: DC | PRN
  Administered 2016-12-26: 650 mg via ORAL
  Filled 2016-12-25 (×2): qty 2

## 2016-12-25 MED ORDER — DEXAMETHASONE SODIUM PHOSPHATE 10 MG/ML IJ SOLN
10.0000 mg | Freq: Once | INTRAMUSCULAR | Status: AC
  Administered 2016-12-26: 10 mg via INTRAVENOUS
  Filled 2016-12-25: qty 1

## 2016-12-25 MED ORDER — ONDANSETRON HCL 4 MG PO TABS: 4.0000 mg | ORAL_TABLET | Freq: Four times a day (QID) | ORAL | Status: DC | PRN

## 2016-12-25 MED ORDER — MIDAZOLAM HCL 2 MG/2ML IJ SOLN
INTRAMUSCULAR | Status: AC
  Filled 2016-12-25: qty 2

## 2016-12-25 MED ORDER — OXYCODONE-ACETAMINOPHEN 5-325 MG PO TABS: 1.0000 | ORAL_TABLET | ORAL | 0 refills | Status: DC | PRN

## 2016-12-25 MED ORDER — TRANEXAMIC ACID 1000 MG/10ML IV SOLN
2000.0000 mg | INTRAVENOUS | Status: AC
  Administered 2016-12-25: 2000 mg via TOPICAL
  Filled 2016-12-25: qty 20

## 2016-12-25 MED ORDER — 0.9 % SODIUM CHLORIDE (POUR BTL) OPTIME
TOPICAL | Status: DC | PRN
  Administered 2016-12-25: 1000 mL

## 2016-12-25 MED ORDER — TRANEXAMIC ACID 1000 MG/10ML IV SOLN
1000.0000 mg | INTRAVENOUS | Status: AC
  Administered 2016-12-25: 1000 mg via INTRAVENOUS
  Filled 2016-12-25: qty 10

## 2016-12-25 MED ORDER — LIDOCAINE HCL (CARDIAC) 20 MG/ML IV SOLN
INTRAVENOUS | Status: DC | PRN
  Administered 2016-12-25: 100 mg via INTRAVENOUS

## 2016-12-25 MED ORDER — TIZANIDINE HCL 2 MG PO TABS: 2.0000 mg | ORAL_TABLET | Freq: Four times a day (QID) | ORAL | 0 refills | Status: DC | PRN

## 2016-12-25 MED ORDER — HYDROCORTISONE 2.5 % RE CREA
1.0000 "application " | TOPICAL_CREAM | Freq: Every day | RECTAL | Status: DC
  Administered 2016-12-26 – 2016-12-27 (×2): 1 via RECTAL
  Filled 2016-12-25: qty 28.35

## 2016-12-25 MED ORDER — POLYETHYLENE GLYCOL 3350 17 G PO PACK: 17.0000 g | PACK | Freq: Every day | ORAL | Status: DC | PRN

## 2016-12-25 MED ORDER — EPHEDRINE 5 MG/ML INJ
INTRAVENOUS | Status: AC
  Filled 2016-12-25: qty 10

## 2016-12-25 MED ORDER — GABAPENTIN 300 MG PO CAPS
300.0000 mg | ORAL_CAPSULE | Freq: Three times a day (TID) | ORAL | Status: DC
  Administered 2016-12-25 – 2016-12-27 (×5): 300 mg via ORAL
  Filled 2016-12-25 (×5): qty 1

## 2016-12-25 MED ORDER — BUPIVACAINE LIPOSOME 1.3 % IJ SUSP
20.0000 mL | Freq: Once | INTRAMUSCULAR | Status: AC
Start: 2016-12-25 — End: 2016-12-25
  Administered 2016-12-25: 20 mL
  Filled 2016-12-25: qty 20

## 2016-12-25 MED ORDER — ONDANSETRON HCL 4 MG/2ML IJ SOLN
INTRAMUSCULAR | Status: AC
  Filled 2016-12-25: qty 2

## 2016-12-25 MED ORDER — ONDANSETRON HCL 4 MG/2ML IJ SOLN: 4.0000 mg | Freq: Four times a day (QID) | INTRAMUSCULAR | Status: DC | PRN

## 2016-12-25 MED ORDER — BISACODYL 5 MG PO TBEC: 5.0000 mg | DELAYED_RELEASE_TABLET | Freq: Every day | ORAL | Status: DC | PRN

## 2016-12-25 MED ORDER — LACTATED RINGERS IV SOLN
INTRAVENOUS | Status: DC | PRN
  Administered 2016-12-25 (×2): via INTRAVENOUS

## 2016-12-25 MED ORDER — FENTANYL CITRATE (PF) 100 MCG/2ML IJ SOLN
INTRAMUSCULAR | Status: DC | PRN
  Administered 2016-12-25 (×2): 50 ug via INTRAVENOUS
  Administered 2016-12-25: 150 ug via INTRAVENOUS
  Administered 2016-12-25: 50 ug via INTRAVENOUS

## 2016-12-25 SURGICAL SUPPLY — 75 items
BLADE SAW SAG 73X25 THK (BLADE)
BLADE SAW SGTL 73X25 THK (BLADE) IMPLANT
BRUSH FEMORAL CANAL (MISCELLANEOUS) IMPLANT
CONT SPEC 4OZ CLIKSEAL STRL BL (MISCELLANEOUS) ×2 IMPLANT
COVER SURGICAL LIGHT HANDLE (MISCELLANEOUS) ×3 IMPLANT
CUP ACETAB 60MM (Orthopedic Implant) ×2 IMPLANT
CUP PINN GRIPTON 58 100 (Cup) ×2 IMPLANT
DEVICE TROCH REATTACH 4 CABLE (Orthopedic Implant) ×2 IMPLANT
DRAPE C-ARM 42X72 X-RAY (DRAPES) IMPLANT
DRAPE HALF SHEET 40X57 (DRAPES) ×3 IMPLANT
DRAPE ORTHO SPLIT 77X108 STRL (DRAPES) ×3
DRAPE SURG ORHT 6 SPLT 77X108 (DRAPES) ×1 IMPLANT
DRAPE U-SHAPE 47X51 STRL (DRAPES) ×3 IMPLANT
DRILL BIT 7/64X5 (BIT) ×3 IMPLANT
DRSG AQUACEL AG ADV 3.5X10 (GAUZE/BANDAGES/DRESSINGS) ×1 IMPLANT
DRSG AQUACEL AG ADV 3.5X14 (GAUZE/BANDAGES/DRESSINGS) ×2 IMPLANT
DURAPREP 26ML APPLICATOR (WOUND CARE) ×3 IMPLANT
ELECT BLADE 4.0 EZ CLEAN MEGAD (MISCELLANEOUS) ×3
ELECT BLADE 6.5 EXT (BLADE) IMPLANT
ELECT REM PT RETURN 9FT ADLT (ELECTROSURGICAL) ×3
ELECTRODE BLDE 4.0 EZ CLN MEGD (MISCELLANEOUS) IMPLANT
ELECTRODE REM PT RTRN 9FT ADLT (ELECTROSURGICAL) ×1 IMPLANT
EVACUATOR 1/8 PVC DRAIN (DRAIN) IMPLANT
GAUZE SPONGE 4X4 12PLY STRL (GAUZE/BANDAGES/DRESSINGS) ×1 IMPLANT
GAUZE XEROFORM 5X9 LF (GAUZE/BANDAGES/DRESSINGS) ×1 IMPLANT
GLOVE BIO SURGEON STRL SZ7.5 (GLOVE) ×3 IMPLANT
GLOVE BIO SURGEON STRL SZ8.5 (GLOVE) ×6 IMPLANT
GLOVE BIOGEL PI IND STRL 8 (GLOVE) ×2 IMPLANT
GLOVE BIOGEL PI IND STRL 9 (GLOVE) ×1 IMPLANT
GLOVE BIOGEL PI INDICATOR 8 (GLOVE) ×4
GLOVE BIOGEL PI INDICATOR 9 (GLOVE) ×2
GOWN STRL REUS W/ TWL LRG LVL3 (GOWN DISPOSABLE) ×1 IMPLANT
GOWN STRL REUS W/ TWL XL LVL3 (GOWN DISPOSABLE) ×2 IMPLANT
GOWN STRL REUS W/TWL LRG LVL3 (GOWN DISPOSABLE) ×3
GOWN STRL REUS W/TWL XL LVL3 (GOWN DISPOSABLE) ×6
HANDPIECE INTERPULSE COAX TIP (DISPOSABLE) ×3
HEAD SROM 36MM PLUS 12 IMPLANT
HOOD PEEL AWAY FACE SHEILD DIS (HOOD) ×6 IMPLANT
KIT BASIN OR (CUSTOM PROCEDURE TRAY) ×3 IMPLANT
KIT ROOM TURNOVER OR (KITS) ×3 IMPLANT
LINER NEUTRAL 60X36X58 P4 HIP (Liner) ×2 IMPLANT
MANIFOLD NEPTUNE II (INSTRUMENTS) ×5 IMPLANT
NEEDLE 22X1 1/2 (OR ONLY) (NEEDLE) ×3 IMPLANT
NS IRRIG 1000ML POUR BTL (IV SOLUTION) ×4 IMPLANT
PACK TOTAL JOINT (CUSTOM PROCEDURE TRAY) ×3 IMPLANT
PAD ARMBOARD 7.5X6 YLW CONV (MISCELLANEOUS) ×6 IMPLANT
PASSER SUT SWANSON 36MM LOOP (INSTRUMENTS) IMPLANT
PRESSURIZER FEMORAL UNIV (MISCELLANEOUS) IMPLANT
SCREW 6.5MMX25MM (Screw) ×4 IMPLANT
SCREW 6.5MMX35MM (Screw) ×2 IMPLANT
SET HNDPC FAN SPRY TIP SCT (DISPOSABLE) IMPLANT
SLEEVE SURGEON STRL (DRAPES) IMPLANT
SPONGE LAP 18X18 X RAY DECT (DISPOSABLE) ×6 IMPLANT
SROM HEAD 36MM PLUS 12 ×3 IMPLANT
STAPLER VISISTAT 35W (STAPLE) ×1 IMPLANT
SUT ETHIBOND 2 V 37 (SUTURE) ×3 IMPLANT
SUT VIC AB 0 CT1 27 (SUTURE) ×6
SUT VIC AB 0 CT1 27XBRD ANBCTR (SUTURE) IMPLANT
SUT VIC AB 0 CTX 36 (SUTURE) ×3
SUT VIC AB 0 CTX36XBRD ANTBCTR (SUTURE) ×1 IMPLANT
SUT VIC AB 1 CTX 36 (SUTURE) ×3
SUT VIC AB 1 CTX36XBRD ANBCTR (SUTURE) ×1 IMPLANT
SUT VIC AB 2-0 CT1 27 (SUTURE) ×3
SUT VIC AB 2-0 CT1 TAPERPNT 27 (SUTURE) ×1 IMPLANT
SUT VIC AB 3-0 CT1 27 (SUTURE) ×3
SUT VIC AB 3-0 CT1 TAPERPNT 27 (SUTURE) ×1 IMPLANT
SWAB COLLECTION DEVICE MRSA (MISCELLANEOUS) ×3 IMPLANT
SWAB CULTURE ESWAB REG 1ML (MISCELLANEOUS) ×3 IMPLANT
SYR 20ML ECCENTRIC (SYRINGE) ×3 IMPLANT
SYR CONTROL 10ML LL (SYRINGE) ×3 IMPLANT
TOWEL OR 17X24 6PK STRL BLUE (TOWEL DISPOSABLE) ×3 IMPLANT
TOWEL OR 17X26 10 PK STRL BLUE (TOWEL DISPOSABLE) ×3 IMPLANT
TOWER CARTRIDGE SMART MIX (DISPOSABLE) IMPLANT
TRAY FOLEY CATH SILVER 14FR (SET/KITS/TRAYS/PACK) IMPLANT
WATER STERILE IRR 1000ML POUR (IV SOLUTION) ×5 IMPLANT

## 2016-12-25 NOTE — Interval H&P Note (Signed)
History and Physical Interval Note:  12/25/2016 1:48 PM  Jonathan Harvey  has presented today for surgery, with the diagnosis of Left hip  The various methods of treatment have been discussed with the patient and family. After consideration of risks, benefits and other options for treatment, the patient has consented to  Procedure(s): TOTAL HIP REVISION (Left) as a surgical intervention .  The patient's history has been reviewed, patient examined, no change in status, stable for surgery.  I have reviewed the patient's chart and labs.  Questions were answered to the patient's satisfaction.     Kerin Salen

## 2016-12-25 NOTE — Op Note (Signed)
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  Displaced greater trochanteric fracture  Postoperative diagnosis: Same  Procedure: Revision of left total hip acetabular component with a removal of Duraloc liner and shell revision to 16 mm DePuy Gryption multihole shell with 3 dome screws 10 degree liner index posterior superior to except a +12 36 mm femoral head on top of an S-ROM stem that was retained.  Patient was noted to have a displaced greater trochanteric fracture on the initial approach this was fixed with a 121 mm, Zimmer greater trochanteric plate with 4 cerclage cables.  Surgeon: Kathalene Frames. Mayer Camel M.D.  Assistant: Kerry Hough. Barton Dubois  (present throughout entire procedure and necessary for timely completion of the procedure)  Estimated blood loss: 800 cc  Fluid replacement: 1500 cc of crystalloid  Complications: None  Specimens: Synovial fluid sent for Gram stain and culture  Indications: 71 year old man underwent revision left hip acetabular component for worn polyethylene liner about 11 months ago.  He did well until 2 weeks ago when he noticed that the hip "felt funky" came in for evaluation x-rays showed that the liner had disassociated from the shell and the locking wire was completely out and fragmented.  The hip was 50% dislocated.  No history of fevers chills or wound problems.  He is taken for revision of the acetabular shell assuming that the locking mechanism on the old shell is incompetent.  Risks and benefits of surgery been discussed questions answered if surgery is not done the hip will fail completely and he will be unable to walk.  Procedure: Patient was identified by arm band receive preoperative IV antibiotics in the holding area at, [facility] hospital. Taken to the operating room where the appropriate anesthetic monitors were attached and general endotracheal anesthesia induced with the patient in the supine position. Rolled into  the R lateral decubitus position and fixed there with a mark 2 pelvic clamp. The limb prepped and draped in usual sterile fashion from the ankle to the hemipelvis. Time out procedure performed. Skin along the lateral hip and thigh infiltrated with 10 cc of 1/2% Marcaine and epinephrine solution. We began the operation by recreating the old posterior lateral incision 15 cm in line through the skin and subcutaneous tissue down to the level of the IT band which was cut in line with the skin incision.  This exposed the greater trochanter which was noted to be fractured near the top of the S-ROM shoulder and displaced about 2 cm.  The hip was flexed and internally rotated dislocating the +9 femoral head from the partially displaced liner using a metal cylinder we remove the metal head from the S-ROM stem which was found to be stable.  We proceeded with removal of the displaced polyethylene liner and fragmented wire.  We carefully exposed the rim of the Duraloc shell and then using curved osteotomes from the  [employer] revision set removed the Duraloc shell which was well fixed.  At this was a 56 mm shell we then reamed up to 57 mm and reamed medially another centimeter and attempted to insert a 58 mm Gryption shell but for reasons unknown it did not achieve stability when it was hammered into place.  At this point we selected a 60 mm Gryption multihole shell and hammered this into place in 45 degrees of abduction and 20 degrees of anteversion and did get provisional fixation because of the problems we had with the 58 mm shell we  elected to put 3 dome screws and a got good firm fixation with a dome screws.  This Was a good 8 mm medial to the original shell and slightly superior.  We then performed trial reductions with a 10 degree liner index posterior superior and a +12 head the one that had been removed was a +9 head.  Stability to 90 degrees of flexion and 60 of internal rotation was noted.  At this point a real  polyethylene liner 10 degree index posterior superior was hammered into the shell.  An 8+1236 mm metal head hammered onto the stem.  We then directed our attention to the displaced greater trochanteric fracture scar tissue was removed the fracture was provisionally fixed with a large tongs and a 121 mm Zimmer Biomet plate was applied and hammered into the greater trochanter superiorly setting the spikes.  The plate was then fixed with 4 2 mm cables and tension appropriately.  The hip was taken through range of motion to make sure there was no motion at the fracture site.  The cables were then locked into place with screws and trimmed.  The wound was irrigated out with normal saline solution Exparel was placed in the soft tissues using 10 cc syringes a 22-gauge needles.  The vastus lateralis was closed with a running 0 Vicryl suture the IT band with running #1 Vicryl suture the subcutaneous tissue with running 2-0 Vicryl suture in the subcuticular tissue with running 3-0 Vicryl suture and Aquacil dressing was applied the patient was unclamped-year-old spine awaken extubated and taken to the recovery room without difficulty

## 2016-12-25 NOTE — Discharge Instructions (Addendum)
INSTRUCTIONS AFTER JOINT REPLACEMENT   o Remove items at home which could result in a fall. This includes throw rugs or furniture in walking pathways o ICE to the affected joint every three hours while awake for 30 minutes at a time, for at least the first 3-5 days, and then as needed for pain and swelling.  Continue to use ice for pain and swelling. You may notice swelling that will progress down to the foot and ankle.  This is normal after surgery.  Elevate your leg when you are not up walking on it.   o Continue to use the breathing machine you got in the hospital (incentive spirometer) which will help keep your temperature down.  It is common for your temperature to cycle up and down following surgery, especially at night when you are not up moving around and exerting yourself.  The breathing machine keeps your lungs expanded and your temperature down.   DIET:  As you were doing prior to hospitalization, we recommend a well-balanced diet.  DRESSING / WOUND CARE / SHOWERING  Keep the surgical dressing until follow up.  The dressing is water proof, so you can shower without any extra covering.  IF THE DRESSING FALLS OFF or the wound gets wet inside, change the dressing with sterile gauze.  Please use good hand washing techniques before changing the dressing.  Do not use any lotions or creams on the incision until instructed by your surgeon.    ACTIVITY  o Increase activity slowly as tolerated, but follow the weight bearing instructions below.   o No driving for 6 weeks or until further direction given by your physician.  You cannot drive while taking narcotics.  o No lifting or carrying greater than 10 lbs. until further directed by your surgeon. o Avoid periods of inactivity such as sitting longer than an hour when not asleep. This helps prevent blood clots.  o You may return to work once you are authorized by your doctor.     WEIGHT BEARING   Partial weight bearing with assist device as  directed.  50% to the left leg   EXERCISES  Results after joint replacement surgery are often greatly improved when you follow the exercise, range of motion and muscle strengthening exercises prescribed by your doctor. Safety measures are also important to protect the joint from further injury. Any time any of these exercises cause you to have increased pain or swelling, decrease what you are doing until you are comfortable again and then slowly increase them. If you have problems or questions, call your caregiver or physical therapist for advice.   Rehabilitation is important following a joint replacement. After just a few days of immobilization, the muscles of the leg can become weakened and shrink (atrophy).  These exercises are designed to build up the tone and strength of the thigh and leg muscles and to improve motion. Often times heat used for twenty to thirty minutes before working out will loosen up your tissues and help with improving the range of motion but do not use heat for the first two weeks following surgery (sometimes heat can increase post-operative swelling).   These exercises can be done on a training (exercise) mat, on the floor, on a table or on a bed. Use whatever works the best and is most comfortable for you.    Use music or television while you are exercising so that the exercises are a pleasant break in your day. This will make your life  better with the exercises acting as a break in your routine that you can look forward to.   Perform all exercises about fifteen times, three times per day or as directed.  You should exercise both the operative leg and the other leg as well.  Exercises include:    Quad Sets - Tighten up the muscle on the front of the thigh (Quad) and hold for 5-10 seconds.    Straight Leg Raises - With your knee straight (if you were given a brace, keep it on), lift the leg to 60 degrees, hold for 3 seconds, and slowly lower the leg.  Perform this exercise  against resistance later as your leg gets stronger.   Leg Slides: Lying on your back, slowly slide your foot toward your buttocks, bending your knee up off the floor (only go as far as is comfortable). Then slowly slide your foot back down until your leg is flat on the floor again.   Angel Wings: Lying on your back spread your legs to the side as far apart as you can without causing discomfort.   Hamstring Strength:  Lying on your back, push your heel against the floor with your leg straight by tightening up the muscles of your buttocks.  Repeat, but this time bend your knee to a comfortable angle, and push your heel against the floor.  You may put a pillow under the heel to make it more comfortable if necessary.   A rehabilitation program following joint replacement surgery can speed recovery and prevent re-injury in the future due to weakened muscles. Contact your doctor or a physical therapist for more information on knee rehabilitation.    CONSTIPATION  Constipation is defined medically as fewer than three stools per week and severe constipation as less than one stool per week.  Even if you have a regular bowel pattern at home, your normal regimen is likely to be disrupted due to multiple reasons following surgery.  Combination of anesthesia, postoperative narcotics, change in appetite and fluid intake all can affect your bowels.   YOU MUST use at least one of the following options; they are listed in order of increasing strength to get the job done.  They are all available over the counter, and you may need to use some, POSSIBLY even all of these options:    Drink plenty of fluids (prune juice may be helpful) and high fiber foods Colace 100 mg by mouth twice a day  Senokot for constipation as directed and as needed Dulcolax (bisacodyl), take with full glass of water  Miralax (polyethylene glycol) once or twice a day as needed.  If you have tried all these things and are unable to have a  bowel movement in the first 3-4 days after surgery call either your surgeon or your primary doctor.    If you experience loose stools or diarrhea, hold the medications until you stool forms back up.  If your symptoms do not get better within 1 week or if they get worse, check with your doctor.  If you experience "the worst abdominal pain ever" or develop nausea or vomiting, please contact the office immediately for further recommendations for treatment.   ITCHING:  If you experience itching with your medications, try taking only a single pain pill, or even half a pain pill at a time.  You can also use Benadryl over the counter for itching or also to help with sleep.   TED HOSE STOCKINGS:  Use stockings on both legs  until for at least 2 weeks or as directed by physician office. They may be removed at night for sleeping.  MEDICATIONS:  See your medication summary on the After Visit Summary that nursing will review with you.  You may have some home medications which will be placed on hold until you complete the course of blood thinner medication.  It is important for you to complete the blood thinner medication as prescribed.  PRECAUTIONS:  If you experience chest pain or shortness of breath - call 911 immediately for transfer to the hospital emergency department.   If you develop a fever greater that 101 F, purulent drainage from wound, increased redness or drainage from wound, foul odor from the wound/dressing, or calf pain - CONTACT YOUR SURGEON.                                                   FOLLOW-UP APPOINTMENTS:  If you do not already have a post-op appointment, please call the office for an appointment to be seen by your surgeon.  Guidelines for how soon to be seen are listed in your After Visit Summary, but are typically between 1-4 weeks after surgery.  OTHER INSTRUCTIONS:   Knee Replacement:  Do not place pillow under knee, focus on keeping the knee straight while resting. CPM  instructions: 0-90 degrees, 2 hours in the morning, 2 hours in the afternoon, and 2 hours in the evening. Place foam block, curve side up under heel at all times except when in CPM or when walking.  DO NOT modify, tear, cut, or change the foam block in any way.  MAKE SURE YOU:   Understand these instructions.   Get help right away if you are not doing well or get worse.    Thank you for letting us be a part of your medical care team.  It is a privilege we respect greatly.  We hope these instructions will help you stay on track for a fast and full recovery!

## 2016-12-25 NOTE — H&P (Signed)
TOTAL HIP REVISION ADMISSION H&P  Patient is admitted for left revision total hip arthroplasty.  Subjective:  Chief Complaint: left hip pain  HPI: Jonathan Harvey, 71 y.o. 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 The indications for the revision total hip arthroplasty are fracture or mechanical failure of one or more component.  Onset of symptoms was abrupt starting 1/4 years ago with rapidlly worsening course since that time.  Prior procedures on the left hip include arthroplasty.  Patient currently rates pain in the left hip at 10 out of 10 with activity.  There is worsening of pain with activity and weight bearing, trendelenberg gait, pain that interfers with activities of daily living and pain with passive range of motion. Patient has evidence of prosthetic loosening by imaging studies.  This condition presents safety issues increasing the risk of falls.   There is no current active infection.  Patient Active Problem List   Diagnosis Date Noted  . Primary localized osteoarthritis of right knee 08/26/2016  . Primary osteoarthritis of right knee 08/25/2016  . Status post revision of total hip 04/24/2016  . ED (erectile dysfunction) of organic origin 07/01/2012  . Malignant neoplasm of prostate ([place]) 07/01/2012  . Microscopic hematuria 07/01/2012  . Cervical stenosis of spinal canal 01/10/2012   Past Medical History:  Diagnosis Date  . Arthritis   . Hepatitis    Hepatitis B  2008  . History of kidney stones   . Hypertension   . Prostate cancer [facility])    s/p robotic prostatectomy 10/2006  . Renal cancer [facility])     Past Surgical History:  Procedure Laterality Date  . BREAST LUMPECTOMY Left    lump remove from left arm  . CARPAL TUNNEL RELEASE Right  2010  . curved femur Left   . HAND SURGERY Right 1987   right thumb and index finger  . HERNIA REPAIR  2008   inguineal  . JOINT REPLACEMENT Left 2002   hip replacement  . MASTOIDECTOMY  1982  . radical prostectomy    . TOTAL HIP REVISION Left 04/24/2016   Procedure: TOTAL HIP REVISION;  Surgeon: [name], MD;  Location: [place];  Service: Orthopedics;  Laterality: Left;  . TOTAL KNEE ARTHROPLASTY Right 08/26/2016   Procedure: RIGHT TOTAL KNEE ARTHROPLASTY;  Surgeon: [name], MD;  Location: [place];  Service: Orthopedics;  Laterality: Right;    No current facility-administered medications for this encounter.    Current Outpatient Prescriptions  Medication Sig Dispense Refill Last Dose  . aspirin EC 81 MG tablet Take 81 mg by mouth daily.     . cetirizine-pseudoephedrine (ZYRTEC-D) 5-120 MG tablet Take 1 tablet by mouth at bedtime.   08/25/2016 at Unknown time  . Cholecalciferol (VITAMIN D) 2000 units tablet Take 2,000 Units by mouth daily.    Past Week at Unknown time  . Cyanocobalamin (VITAMIN B-12) 5000 MCG SUBL Place 5,000 mcg under the tongue daily.   Past Week at Unknown time  . furosemide (LASIX) 40 MG tablet Take 20 mg by mouth daily.    08/25/2016 at Unknown time  . gabapentin (NEURONTIN) 300 MG capsule Take 300 mg by mouth 2 (two) times daily. Morning, Lunch,  (may take dose if needed at bedtime)   08/26/2016 at 0800  . hydrocortisone (  ANUSOL-HC) 2.5 % rectal cream Place 1 application rectally daily. Applied after bathing due to hemorrhoids   08/26/2016 at 0700  . losartan-hydrochlorothiazide (HYZAAR) 100-25 MG tablet Take 1 tablet by mouth daily.   08/25/2016 at Unknown time  . metoprolol succinate (TOPROL-XL) 25 MG 24 hr tablet Take 25 mg by mouth daily.   08/26/2016 at 0800  . Multiple Vitamin (MULTIVITAMIN WITH MINERALS) TABS tablet Take 1 tablet by mouth daily.   Past Week at Unknown time  . potassium chloride SA (K-DUR,KLOR-CON) 20 MEQ tablet Take 20 mEq by mouth 2 (two) times  daily.     . simvastatin (ZOCOR) 40 MG tablet Take 20 mg by mouth at bedtime.   08/25/2016 at Unknown time  . traMADol (ULTRAM) 50 MG tablet Take 100 mg by mouth every 4 (four) hours as needed (for pain).    08/26/2016 at 0800   Allergies  Allergen Reactions  . Lactose Intolerance (Gi) Other (See Comments)    Mild GI upset    Social History  Substance Use Topics  . Smoking status: Never Smoker  . Smokeless tobacco: Former Systems developer  . Alcohol use 8.4 oz/week    14 Cans of beer per week     Comment: daily    History reviewed. No pertinent family history.    Review of Systems  Constitutional: Negative.   HENT: Positive for hearing loss.   Eyes: Positive for blurred vision.  Respiratory: Negative.   Cardiovascular: Negative.        HTN  Gastrointestinal: Negative.   Genitourinary: Negative.   Musculoskeletal: Positive for joint pain.  Skin: Negative.   Neurological: Negative.   Endo/Heme/Allergies: Negative.   Psychiatric/Behavioral: Negative.     Objective:  Physical Exam  Constitutional: He is oriented to person, place, and time. He appears well-developed and well-nourished.  HENT:  Head: Normocephalic and atraumatic.  Eyes: Pupils are equal, round, and reactive to light.  Neck: Normal range of motion. Neck supple.  Cardiovascular: Intact distal pulses.   Respiratory: Effort normal.  Musculoskeletal: He exhibits tenderness and deformity.  Neurological: He is alert and oriented to person, place, and time.  Skin: Skin is warm and dry.  Psychiatric: He has a normal mood and affect. His behavior is normal. Judgment and thought content normal.    Vital signs in last 24 hours:     Labs:   Estimated body mass index is 28.62 kg/m as calculated from the following:   Height as of 08/15/16: 5' 8.5" (1.74 m).   Weight as of 08/26/16: 86.6 kg (191 lb).  Imaging Review:  Plain radiographs demonstrate  AP of the pelvis. This shows a subluxed left total hip arthroplasty and a  fracture of the locking ring with migration of the locking ring inferiorly over the trochanteric region.  Assessment/Plan:  End stage arthritis, left hip(s) with failed previous arthroplasty.  The patient history, physical examination, clinical judgement of the provider and imaging studies are consistent with end stage degenerative joint disease of the left hip(s), previous total hip arthroplasty. Revision total hip arthroplasty is deemed medically necessary. The treatment options including medical management, injection therapy, arthroscopy and arthroplasty were discussed at length. The risks and benefits of total hip arthroplasty were presented and reviewed. The risks due to aseptic loosening, infection, stiffness, dislocation/subluxation,  thromboembolic complications and other imponderables were discussed.  The patient acknowledged the explanation, agreed to proceed with the plan and consent was signed. Patient is being admitted for inpatient treatment for surgery, pain control, PT,  OT, prophylactic antibiotics, VTE prophylaxis, progressive ambulation and ADL's and discharge planning. The patient is planning to be discharged home with home health services.

## 2016-12-25 NOTE — Transfer of Care (Signed)
Immediate Anesthesia Transfer of Care Note  Patient: Jonathan Harvey  Procedure(s) Performed: OPEN REDUCTION INTERNAL FIXATION GREATER TROCHANTERIC FRACTURE, ACETABULAR REVISION (Left )  Patient Location: PACU  Anesthesia Type:General  Level of Consciousness: awake  Airway & Oxygen Therapy: Patient Spontanous Breathing and Patient connected to nasal cannula oxygen  Post-op Assessment: Report given to RN and Post -op Vital signs reviewed and stable  Post vital signs: Reviewed and stable  Last Vitals:  Vitals:   12/25/16 1309 12/25/16 1752  BP: (!) 130/57   Pulse: 71   Resp: 20   Temp: 36.6 C 36.5 C  SpO2: 99%     Last Pain:  Vitals:   12/25/16 1309  TempSrc: Oral      Patients Stated Pain Goal: 4 (61/95/09 3267)  Complications: No apparent anesthesia complications

## 2016-12-25 NOTE — Anesthesia Preprocedure Evaluation (Addendum)
Anesthesia Evaluation  Patient identified by MRN, date of birth, ID band Patient awake    Reviewed: Allergy & Precautions, NPO status , Patient's Chart, lab work & pertinent test results, reviewed documented beta blocker date and time   Airway Mallampati: II  TM Distance: <3 FB Neck ROM: Full    Dental  (+) Teeth Intact, Dental Advisory Given Upper permanent bridge:   Pulmonary neg pulmonary ROS,    Pulmonary exam normal breath sounds clear to auscultation       Cardiovascular hypertension, Pt. on home beta blockers and Pt. on medications Normal cardiovascular exam Rhythm:Regular Rate:Normal     Neuro/Psych Spinal stenosis  negative psych ROS   GI/Hepatic negative GI ROS, (+)     substance abuse  marijuana use, Hepatitis -, B  Endo/Other  negative endocrine ROS  Renal/GU Renal disease (Renal cancer)     Musculoskeletal  (+) Arthritis , Osteoarthritis,    Abdominal   Peds  Hematology  (+) Blood dyscrasia, anemia ,   Anesthesia Other Findings Day of surgery medications reviewed with the patient.  Reproductive/Obstetrics                           Anesthesia Physical Anesthesia Plan  ASA: III  Anesthesia Plan: General   Post-op Pain Management:    Induction: Intravenous  PONV Risk Score and Plan: 2 and Ondansetron and Dexamethasone  Airway Management Planned: Oral ETT  Additional Equipment:   Intra-op Plan:   Post-operative Plan: Extubation in OR  Informed Consent: I have reviewed the patients History and Physical, chart, labs and discussed the procedure including the risks, benefits and alternatives for the proposed anesthesia with the patient or authorized representative who has indicated his/her understanding and acceptance.   Dental advisory given  Plan Discussed with: CRNA  Anesthesia Plan Comments: (Risks/benefits of general anesthesia discussed with patient including  risk of damage to teeth, lips, gum, and tongue, nausea/vomiting, allergic reactions to medications, and the possibility of heart attack, stroke and death.  All patient questions answered.  Patient wishes to proceed.)        Anesthesia Quick Evaluation

## 2016-12-25 NOTE — Anesthesia Procedure Notes (Signed)
Procedure Name: Intubation Date/Time: 12/25/2016 2:38 PM Performed by: Adalberto Ill Pre-anesthesia Checklist: Patient identified, Emergency Drugs available, Patient being monitored, Suction available and Timeout performed Patient Re-evaluated:Patient Re-evaluated prior to induction Oxygen Delivery Method: Circle system utilized Preoxygenation: Pre-oxygenation with 100% oxygen Induction Type: IV induction Ventilation: Mask ventilation without difficulty Laryngoscope Size: Mac and 3 Grade View: Grade I Tube type: Oral Tube size: 7.5 mm Number of attempts: 1 Airway Equipment and Method: Stylet Placement Confirmation: ETT inserted through vocal cords under direct vision,  breath sounds checked- equal and bilateral and positive ETCO2 Secured at: 21 cm Tube secured with: Tape Dental Injury: Teeth and Oropharynx as per pre-operative assessment

## 2016-12-26 ENCOUNTER — Inpatient Hospital Stay (HOSPITAL_COMMUNITY): Payer: Medicare Other

## 2016-12-26 LAB — CBC
HEMATOCRIT: 24.9 % — AB (ref 39.0–52.0)
Hemoglobin: 8.1 g/dL — ABNORMAL LOW (ref 13.0–17.0)
MCH: 29.6 pg (ref 26.0–34.0)
MCHC: 32.5 g/dL (ref 30.0–36.0)
MCV: 90.9 fL (ref 78.0–100.0)
PLATELETS: 300 10*3/uL (ref 150–400)
RBC: 2.74 MIL/uL — AB (ref 4.22–5.81)
RDW: 15.2 % (ref 11.5–15.5)
WBC: 6 10*3/uL (ref 4.0–10.5)

## 2016-12-26 LAB — BASIC METABOLIC PANEL
Anion gap: 9 (ref 5–15)
BUN: 17 mg/dL (ref 6–20)
CO2: 29 mmol/L (ref 22–32)
Calcium: 8.4 mg/dL — ABNORMAL LOW (ref 8.9–10.3)
Chloride: 99 mmol/L — ABNORMAL LOW (ref 101–111)
Creatinine, Ser: 1.16 mg/dL (ref 0.61–1.24)
GFR calc Af Amer: >60 mL/min (ref >60)
GLUCOSE: 165 mg/dL — AB (ref 65–99)
POTASSIUM: 3.6 mmol/L (ref 3.5–5.1)
Sodium: 137 mmol/L (ref 135–145)

## 2016-12-26 NOTE — Progress Notes (Signed)
Physical Therapy Treatment Patient Details Name: Jonathan Harvey MRN: [id number] DOB: 10/08/1945 Today's Date: 12/26/2016    History of Present Illness Patient is a 71 y/o male admitted for L THA acetabular revision and repair of greater trochanter fx.  PMH positive for L THA revision 04/2016, R TKA 08/2016, prostate CA, Renal CA, Hepatitis.    PT Comments    Patient progressing with mobility and able to tolerate increase distance as well as work on Aeronautical engineer.  Feel should be ready to d/c home with wife assist after stair training in am.   Follow Up Recommendations  Home health PT     Equipment Recommendations  None recommended by PT    Recommendations for Other Services       Precautions / Restrictions Precautions Precautions: Fall;Posterior Hip Restrictions LLE Weight Bearing: Partial weight bearing LLE Partial Weight Bearing Percentage or Pounds: 50    Mobility  Bed Mobility               General bed mobility comments: up in chair  Transfers Overall transfer level: Needs assistance Equipment used: Rolling walker (2 wheeled) Transfers: Sit to/from Stand Sit to Stand: Min guard         General transfer comment: pt verbalized technique and performed safely with assist for safety  Ambulation/Gait Ambulation/Gait assistance: Supervision Ambulation Distance (Feet): 300 Feet Assistive device: Rolling walker (2 wheeled) Gait Pattern/deviations: Step-to pattern;Step-through pattern;Decreased stride length     General Gait Details: initially cues for keeping step to sequence due to PWB, but able to demonstrate some step through maintaining PWB.  cues for posture and knee flexion   Stairs            Wheelchair Mobility    Modified Rankin (Stroke Patients Only)       Balance Overall balance assessment: Needs assistance   Sitting balance-Leahy Scale: Good       Standing balance-Leahy Scale: Fair                               Cognition Arousal/Alertness: Awake/alert Behavior During Therapy: WFL for tasks assessed/performed Overall Cognitive Status: Within Functional Limits for tasks assessed                                        Exercises Total Joint Exercises Ankle Circles/Pumps: PROM;Both;15 reps;Seated Heel Slides: Left;10 reps;AAROM;Seated Hip ABduction/ADduction: AROM;Left;10 reps;Seated    General Comments        Pertinent Vitals/Pain Pain Score: 7  Pain Location: L hip with standing Pain Descriptors / Indicators: Aching Pain Intervention(s): Monitored during session;Repositioned    Home Living                      Prior Function            PT Goals (current goals can now be found in the care plan section) Progress towards PT goals: Progressing toward goals    Frequency    7X/week      PT Plan      Co-evaluation              AM-PAC PT "6 Clicks" Daily Activity  Outcome Measure  Difficulty turning over in bed (including adjusting bedclothes, sheets and blankets)?: A Lot Difficulty moving from lying on back to sitting on the side of the bed? :  Unable Difficulty sitting down on and standing up from a chair with arms (e.g., wheelchair, bedside commode, etc,.)?: Unable Help needed moving to and from a bed to chair (including a wheelchair)?: A Little Help needed walking in hospital room?: A Little Help needed climbing 3-5 steps with a railing? : A Little 6 Click Score: 13    End of Session Equipment Utilized During Treatment: Gait belt Activity Tolerance: Patient tolerated treatment well Patient left: in chair;with call bell/phone within reach   PT Visit Diagnosis: Pain;Other abnormalities of gait and mobility (R26.89) Pain - Right/Left: Left Pain - part of body: Hip     Time: 1530-1559 PT Time Calculation (min) (ACUTE ONLY): 29 min  Charges:  $Gait Training: 8-22 mins $Therapeutic Exercise: 8-22 mins                    G CodesMagda Harvey, [place] [phone] 12/26/2016    Jonathan Harvey 12/26/2016, 4:07 PM

## 2016-12-26 NOTE — Progress Notes (Signed)
Around 2200 last night, patient ambulated to bathroom with 2 assist and walker. Patient tolerated movement very well and he is voiding.

## 2016-12-26 NOTE — Care Management Note (Signed)
Case Management Note  Patient Details  Name: Jonathan Harvey MRN: [id number] Date of Birth: June 04, 1945  Subjective/Objective:   71 yr old gentleman s/p ORIF revision of left acetabular.                 Action/Plan: Case manager spoke with patient concerning discharge plan and DME. Choice for [name] was offered, referral was called to [place], with [facility], in [place], [place]. [phone], [name] faxed, orders, F2F, OP notes and demographics to [facility] at [phone]. Patient says he has rolling walker, and will order an elongated 3in1 from [employer]. He will have family support at discharge.   Expected Discharge Date:   12/27/16               Expected Discharge Plan:  [place]  [name]:  NA  Discharge planning Services  CM Consult  Post Acute Care Choice:  Home Health Choice offered to:  Patient  DME Arranged:  N/A (has RW and 3in1 will order elongated 3in1 from [place] DME Agency:  NA  HH Arranged:  PT [place] Agency:  [facility] [employer]  Status of Service:  Completed, signed off  If discussed at [employer], dates discussed:    Additional Comments:  Ninfa Meeker, RN 12/26/2016, 12:14 PM

## 2016-12-26 NOTE — Evaluation (Signed)
Physical Therapy Evaluation Patient Details Name: Jonathan Harvey MRN: [id number] DOB: 1945-10-29 Today's Date: 12/26/2016   History of Present Illness  Patient is a 71 y/o male admitted for L THA acetabular revision and repair of greater trochanter fx.  PMH positive for L THA revision 04/2016, R TKA 08/2016, prostate CA, Renal CA, Hepatitis.  Clinical Impression  Patient presents with decreased independence with mobility due to pain, limited ROM of L hip and hip precautions with protected weight bearing.  Continued skilled PT recommended during acute stay as well as follow up HHPT to address issues and improve safety and independence.     Follow Up Recommendations Home health PT    Equipment Recommendations  None recommended by PT    Recommendations for Other Services       Precautions / Restrictions Precautions Precautions: Fall;Posterior Hip Restrictions Weight Bearing Restrictions: Yes LLE Weight Bearing: Partial weight bearing LLE Partial Weight Bearing Percentage or Pounds: 50      Mobility  Bed Mobility Overal bed mobility: Needs Assistance Bed Mobility: Supine to Sit     Supine to sit: Min assist     General bed mobility comments: assist for L LE  Transfers Overall transfer level: Needs assistance Equipment used: Rolling walker (2 wheeled) Transfers: Sit to/from Stand Sit to Stand: Mod assist         General transfer comment: some lifting assist from EOB and BSC over toilet, cues for hand placement and THP  Ambulation/Gait Ambulation/Gait assistance: Min guard;Supervision Ambulation Distance (Feet): 150 Feet Assistive device: Rolling walker (2 wheeled) Gait Pattern/deviations: Step-through pattern;Step-to pattern;Trunk flexed;Decreased stride length     General Gait Details: cues and assist for technique/precautions to maintain PWB L LE and caution with turning to L, cues for posture, walker adjusted for height  Stairs            Wheelchair  Mobility    Modified Rankin (Stroke Patients Only)       Balance Overall balance assessment: Needs assistance   Sitting balance-Leahy Scale: Good     Standing balance support: During functional activity;No upper extremity supported Standing balance-Leahy Scale: Fair Standing balance comment: standing at sink for washing hands, brushing teeth, cues for weight shift to R                             Pertinent Vitals/Pain Pain Assessment: 0-10 Pain Score: 6  Pain Location: R ant thigh Pain Descriptors / Indicators: Aching Pain Intervention(s): Monitored during session;Repositioned    Home Living Family/patient expects to be discharged to:: Private residence Living Arrangements: Spouse/significant other Available Help at Discharge: Family;Available 24 hours/day Type of Home: House Home Access: Stairs to enter Entrance Stairs-Rails: Right Entrance Stairs-Number of Steps: 3 Home Layout: One level Home Equipment: Walker - 2 wheels;Cane - single point;Shower seat - built in;Bedside commode;Grab bars - tub/shower;Hand held shower head      Prior Function Level of Independence: Independent with assistive device(s)         Comments: was using cane     Hand Dominance   Dominant Hand: Right    Extremity/Trunk Assessment   Upper Extremity Assessment Upper Extremity Assessment: Overall WFL for tasks assessed    Lower Extremity Assessment Lower Extremity Assessment: LLE deficits/detail LLE Deficits / Details: AAROM hip flexion limited with pain and precautions, about 60 degrees supine, 80 in sitting, strength at least 3/5 throughout       Communication   Communication:  HOH  Cognition Arousal/Alertness: Awake/alert Behavior During Therapy: WFL for tasks assessed/performed Overall Cognitive Status: Within Functional Limits for tasks assessed                                        General Comments      Exercises Total Joint  Exercises Ankle Circles/Pumps: AROM;Both;15 reps;Supine Quad Sets: AROM;Both;10 reps;Supine Heel Slides: AAROM;10 reps;Supine;Left Hip ABduction/ADduction: AAROM;AROM;Left;10 reps;Supine   Assessment/Plan    PT Assessment Patient needs continued PT services  PT Problem List Decreased strength;Decreased mobility;Decreased range of motion;Decreased activity tolerance;Decreased balance;Decreased knowledge of use of DME;Decreased knowledge of precautions;Pain       PT Treatment Interventions DME instruction;Gait training;Stair training;Balance training;Functional mobility training;Therapeutic exercise;Patient/family education;Therapeutic activities    PT Goals (Current goals can be found in the Care Plan section)  Acute Rehab PT Goals Patient Stated Goal: To go home tomorrow PT Goal Formulation: With patient Time For Goal Achievement: 12/28/16 Potential to Achieve Goals: Good    Frequency 7X/week   Barriers to discharge        Co-evaluation               AM-PAC PT "6 Clicks" Daily Activity  Outcome Measure Difficulty turning over in bed (including adjusting bedclothes, sheets and blankets)?: A Lot Difficulty moving from lying on back to sitting on the side of the bed? : Unable Difficulty sitting down on and standing up from a chair with arms (e.g., wheelchair, bedside commode, etc,.)?: Unable Help needed moving to and from a bed to chair (including a wheelchair)?: A Little Help needed walking in hospital room?: A Little Help needed climbing 3-5 steps with a railing? : A Little 6 Click Score: 13    End of Session Equipment Utilized During Treatment: Gait belt Activity Tolerance: Patient tolerated treatment well Patient left: in chair;with call bell/phone within reach   PT Visit Diagnosis: Pain;Other abnormalities of gait and mobility (R26.89) Pain - Right/Left: Left Pain - part of body: Hip    Time: 0915-1007 PT Time Calculation (min) (ACUTE ONLY): 52  min   Charges:   PT Evaluation $PT Eval Moderate Complexity: 1 Mod PT Treatments $Gait Training: 8-22 mins $Therapeutic Activity: 8-22 mins   PT G CodesMagda Kiel, [place] [phone] 12/26/2016   Reginia Naas 12/26/2016, 10:32 AM

## 2016-12-26 NOTE — Progress Notes (Signed)
PATIENT ID: Jonathan Harvey  MRN: [id number]  DOB/AGE:  07-23-1945 / 71 y.o.  1 Day Post-Op Procedure(s) (LRB): OPEN REDUCTION INTERNAL FIXATION GREATER TROCHANTERIC FRACTURE, ACETABULAR REVISION (Left)    PROGRESS NOTE Subjective: Patient is alert, oriented, no Nausea, no Vomiting, yes passing gas, . Taking PO well. Denies SOB, Chest or Calf Pain. Using Incentive Spirometer, PAS in place. Ambulate WBAT with pt getting up to go to restroom over night Patient reports pain as  Mild to moderate .    Objective: Vital signs in last 24 hours: Vitals:   12/25/16 1937 12/25/16 1954 12/26/16 0500 12/26/16 1000  BP:  (!) 118/50 (!) 118/58 (!) 95/51  Pulse:  80 82 98  Resp:  13 15 16   Temp: 97.7 F (36.5 C) (!) 97.5 F (36.4 C) 97.6 F (36.4 C)   TempSrc:  Oral Oral   SpO2:  100% 100%   Weight:  87.9 kg (193 lb 12.6 oz)    Height:  5\' 9"  (1.753 m)        Intake/Output from previous day: I/O last 3 completed shifts: In: 2050 [I.V.:2050] Out: 1300 [Urine:500; Blood:800]   Intake/Output this shift: No intake/output data recorded.   LABORATORY DATA:  Recent Labs  12/25/16 1300 12/26/16 0622  WBC 6.0 6.0  HGB 12.2* 8.1*  HCT 37.3* 24.9*  PLT 310 300  NA 139 137  K 3.1* 3.6  CL 102 99*  CO2 29 29  BUN 19 17  CREATININE 1.26* 1.16  GLUCOSE 104* 165*  INR 1.01  --   CALCIUM 9.2 8.4*    Examination: Neurologically intact Neurovascular intact Sensation intact distally Intact pulses distally Dorsiflexion/Plantar flexion intact Incision: scant drainage No cellulitis present Compartment soft} XR AP&Lat of hip shows well placed\fixed THA  Assessment:   1 Day Post-Op Procedure(s) (LRB): OPEN REDUCTION INTERNAL FIXATION GREATER TROCHANTERIC FRACTURE, ACETABULAR REVISION (Left) ADDITIONAL DIAGNOSIS:  Expected Acute Blood Loss Anemia, prostate cancer  Plan: PT/OT WBAT, THA  DVT Prophylaxis: SCDx72 hrs, ASA 325 mg BID x 2 weeks  DISCHARGE PLAN: Home  DISCHARGE NEEDS:  HHPT, Walker and 3-in-1 comode seat

## 2016-12-26 NOTE — Anesthesia Postprocedure Evaluation (Signed)
Anesthesia Post Note  Patient: Jonathan Harvey  Procedure(s) Performed: OPEN REDUCTION INTERNAL FIXATION GREATER TROCHANTERIC FRACTURE, ACETABULAR REVISION (Left )     Patient location during evaluation: PACU Anesthesia Type: General Level of consciousness: awake and alert Pain management: pain level controlled Vital Signs Assessment: post-procedure vital signs reviewed and stable Respiratory status: spontaneous breathing, nonlabored ventilation, respiratory function stable and patient connected to nasal cannula oxygen Cardiovascular status: blood pressure returned to baseline and stable Postop Assessment: no apparent nausea or vomiting Anesthetic complications: no    Last Vitals:  Vitals:   12/25/16 1954 12/26/16 0500  BP: (!) 118/50 (!) 118/58  Pulse: 80 82  Resp: 13 15  Temp: (!) 36.4 C 36.4 C  SpO2: 100% 100%    Last Pain:  Vitals:   12/26/16 0556  TempSrc:   PainSc: 4    Pain Goal: Patients Stated Pain Goal: 2 (12/26/16 0456)               Catalina Gravel

## 2016-12-27 ENCOUNTER — Encounter (HOSPITAL_COMMUNITY): Payer: Self-pay | Admitting: Orthopedic Surgery

## 2016-12-27 LAB — CBC
HCT: 21.8 % — ABNORMAL LOW (ref 39.0–52.0)
Hemoglobin: 7.2 g/dL — ABNORMAL LOW (ref 13.0–17.0)
MCH: 30.1 pg (ref 26.0–34.0)
MCHC: 33 g/dL (ref 30.0–36.0)
MCV: 91.2 fL (ref 78.0–100.0)
Platelets: 239 10*3/uL (ref 150–400)
RBC: 2.39 MIL/uL — ABNORMAL LOW (ref 4.22–5.81)
RDW: 15.6 % — AB (ref 11.5–15.5)
WBC: 7 10*3/uL (ref 4.0–10.5)

## 2016-12-27 NOTE — Progress Notes (Signed)
PATIENT ID: Jonathan Harvey  MRN: [id number]  DOB/AGE:  1945/04/23 / 71 y.o.  2 Days Post-Op Procedure(s) (LRB): OPEN REDUCTION INTERNAL FIXATION GREATER TROCHANTERIC FRACTURE, ACETABULAR REVISION (Left)    PROGRESS NOTE Subjective: Patient is alert, oriented, no Nausea, no Vomiting, yes passing gas, . Taking PO well. Denies SOB, Chest or Calf Pain. Using Incentive Spirometer, PAS in place. Ambulate 50% weight bearing with pt walking 300 ft.  Needs to practice stairs today. Patient reports pain as  Mild to moderate.  Pt has no complaints of feeling weak or dizzy. Objective: Vital signs in last 24 hours: Vitals:   12/26/16 1000 12/26/16 1237 12/26/16 2000 12/27/16 0547  BP: (!) 95/51 (!) 107/40 (!) 120/56 127/67  Pulse: 98 84 90 88  Resp: 16 17 16 16   Temp:  98.4 F (36.9 C) 98.1 F (36.7 C) (!) 97 F (36.1 C)  TempSrc:  Oral Oral Oral  SpO2:  100% 98% 97%  Weight:      Height:          Intake/Output from previous day: I/O last 3 completed shifts: In: 4147.1 [P.O.:1520; I.V.:2627.1] Out: 500 [Urine:500]   Intake/Output this shift: No intake/output data recorded.   LABORATORY DATA:  Recent Labs  12/25/16 1300 12/26/16 0622 12/27/16 0450  WBC 6.0 6.0 7.0  HGB 12.2* 8.1* 7.2*  HCT 37.3* 24.9* 21.8*  PLT 310 300 239  NA 139 137  --   K 3.1* 3.6  --   CL 102 99*  --   CO2 29 29  --   BUN 19 17  --   CREATININE 1.26* 1.16  --   GLUCOSE 104* 165*  --   INR 1.01  --   --   CALCIUM 9.2 8.4*  --     Examination: Neurologically intact Neurovascular intact Sensation intact distally Intact pulses distally Dorsiflexion/Plantar flexion intact Incision: scant drainage No cellulitis present Compartment soft} XR AP&Lat of hip shows well placed\fixed THA  Assessment:   2 Days Post-Op Procedure(s) (LRB): OPEN REDUCTION INTERNAL FIXATION GREATER TROCHANTERIC FRACTURE, ACETABULAR REVISION (Left) ADDITIONAL DIAGNOSIS:  Expected Acute Blood Loss Anemia, Acute Blood Loss  Anemia  Plan: PT/OT WBAT, THA  DVT Prophylaxis: SCDx72 hrs, ASA 325 mg BID x 2 weeks  DISCHARGE PLAN: Home  DISCHARGE NEEDS: HHPT, Walker and 3-in-1 comode seat

## 2016-12-27 NOTE — Progress Notes (Signed)
Reviewed Discharge paper work and medication with full understanding

## 2016-12-27 NOTE — Discharge Summary (Signed)
Patient ID: Jonathan Harvey MRN: [id number] DOB/AGE: [birth year] 71 y.o.  Admit date: 12/25/2016 Discharge date: 12/27/2016  Admission Diagnoses:  Principal Problem:   Failure of total hip arthroplasty [facility]) Active Problems:   Primary osteoarthritis of left hip   Discharge Diagnoses:  Same  Past Medical History:  Diagnosis Date  . Arthritis    "all over" (12/25/2016)  . Chronic lower back pain    "severe" (12/25/2016)  . Hepatitis B 2008  . History of kidney stones   . Hypertension   . Hypertriglyceridemia   . Prostate cancer ([facility]) 10/2006   s/p robotic prostatectomy   . Renal cell carcinoma, right (Jonathan Harvey)   . Spinal stenosis     Surgeries: Procedure(s): OPEN REDUCTION INTERNAL FIXATION GREATER TROCHANTERIC FRACTURE, ACETABULAR REVISION on 12/25/2016   Consultants:   Discharged Condition: Improved  Hospital Course: Jonathan Harvey is an 71 y.o. male who was admitted 12/25/2016 for operative treatment ofFailure of total hip arthroplasty ([address]). Patient has severe unremitting pain that affects sleep, daily activities, and work/hobbies. After pre-op clearance the patient was taken to the operating room on 12/25/2016 and underwent  Procedure(s): OPEN REDUCTION INTERNAL FIXATION GREATER TROCHANTERIC FRACTURE, ACETABULAR REVISION.    Patient was given perioperative antibiotics: Anti-infectives    Start     Dose/Rate Route Frequency Ordered Stop   12/25/16 1300  ceFAZolin (ANCEF) IVPB 2g/100 mL premix  Status:  Discontinued     2 g 200 mL/hr over 30 Minutes Intravenous  Once 12/25/16 1256 12/25/16 1947       Patient was given sequential compression devices, early ambulation, and chemoprophylaxis to prevent DVT.  Patient benefited maximally from hospital stay and there were no complications.    Recent vital signs: Patient Vitals for the past 24 hrs:  BP Temp Temp src Pulse Resp SpO2  12/27/16 0547 127/67 (!) 97 F (36.1 C) Oral 88 16 97 %  12/26/16 2000 (!) 120/56 98.1 F  (36.7 C) Oral 90 16 98 %  12/26/16 1237 (!) 107/40 98.4 F (36.9 C) Oral 84 17 100 %  12/26/16 1000 (!) 95/51 - - 98 16 -     Recent laboratory studies:  Recent Labs  12/25/16 1300 12/26/16 0622 12/27/16 0450  WBC 6.0 6.0 7.0  HGB 12.2* 8.1* 7.2*  HCT 37.3* 24.9* 21.8*  PLT 310 300 239  NA 139 137  --   K 3.1* 3.6  --   CL 102 99*  --   CO2 29 29  --   BUN 19 17  --   CREATININE 1.26* 1.16  --   GLUCOSE 104* 165*  --   INR 1.01  --   --   CALCIUM 9.2 8.4*  --      Discharge Medications:   Allergies as of 12/27/2016      Reactions   Lactose Intolerance (gi) Other (See Comments)   Mild GI upset      Medication List    TAKE these medications   aspirin EC 325 MG tablet Take 1 tablet (325 mg total) by mouth 2 (two) times daily. What changed:  medication strength  how much to take  when to take this   cetirizine-pseudoephedrine 5-120 MG tablet Commonly known as:  ZYRTEC-D Take 1 tablet by mouth at bedtime.   furosemide 40 MG tablet Commonly known as:  LASIX Take 20 mg by mouth daily.   gabapentin 300 MG capsule Commonly known as:  NEURONTIN Take 300 mg by mouth 2 (two) times daily.  Morning, Lunch,  (may take dose if needed at bedtime)   guaiFENesin 600 MG 12 hr tablet Commonly known as:  MUCINEX Take 600 mg by mouth 2 (two) times daily.   hydrocortisone 2.5 % rectal cream Commonly known as:  ANUSOL-HC Place 1 application rectally daily. Applied after bathing due to hemorrhoids   losartan-hydrochlorothiazide 100-25 MG tablet Commonly known as:  HYZAAR Take 1 tablet by mouth daily.   metoprolol succinate 25 MG 24 hr tablet Commonly known as:  TOPROL-XL Take 25 mg by mouth daily.   multivitamin with minerals Tabs tablet Take 1 tablet by mouth daily.   oxyCODONE-acetaminophen 5-325 MG tablet Commonly known as:  ROXICET Take 1 tablet by mouth every 4 (four) hours as needed.   potassium chloride SA 20 MEQ tablet Commonly known as:   K-DUR,KLOR-CON Take 20 mEq by mouth 2 (two) times daily.   simvastatin 40 MG tablet Commonly known as:  ZOCOR Take 20 mg by mouth at bedtime.   tiZANidine 2 MG tablet Commonly known as:  ZANAFLEX Take 1 tablet (2 mg total) by mouth every 6 (six) hours as needed for muscle spasms.   traMADol 50 MG tablet Commonly known as:  ULTRAM Take 100 mg by mouth every 4 (four) hours as needed (for pain).   Vitamin B-12 5000 MCG Subl Place 5,000 mcg under the tongue daily.   Vitamin D 2000 units tablet Take 2,000 Units by mouth daily.            Durable Medical Equipment        Start     Ordered   12/25/16 2018  DME Walker rolling  Once    Question:  Patient needs a walker to treat with the following condition  Answer:  Status post total hip replacement, left   12/25/16 2017   12/25/16 2018  DME 3 n 1  Once     12/25/16 2017   12/25/16 2018  DME Bedside commode  Once    Question:  Patient needs a bedside commode to treat with the following condition  Answer:  Status post total hip replacement, left   12/25/16 2017       Discharge Care Instructions        Start     Ordered   12/27/16 0000  Partial weight bearing    Question Answer Comment  % Body Weight 50   Laterality left   Extremity Lower      12/27/16 0811      Diagnostic Studies: Dg Pelvis Portable  Result Date: 12/26/2016 CLINICAL DATA:  Status post surgical repair of proximal left femur EXAM: PORTABLE PELVIS 1-2 VIEWS COMPARISON:  04/24/2016 FINDINGS: Revision of left hip prosthesis is noted. Fixation sideplate is noted along the lateral aspect of the proximal femur with multiple fixation wires identified as well. Previously seen left pubic ramus fractures are again noted. No new acute fracture is seen. Air is noted in the soft tissues related to the recent surgery. IMPRESSION: Status post left hip revision Electronically Signed   By: Inez Catalina M.D.   On: 12/26/2016 07:57    Disposition: 06-Home-Health Care  Svc  Discharge Instructions    Call MD / Call 911    Complete by:  As directed    If you experience chest pain or shortness of breath, CALL 911 and be transported to the hospital emergency room.  If you develope a fever above 101 F, pus (white drainage) or increased drainage or redness at the wound,  or calf pain, call your surgeon's office.   Constipation Prevention    Complete by:  As directed    Drink plenty of fluids.  Prune juice may be helpful.  You may use a stool softener, such as Colace (over the counter) 100 mg twice a day.  Use MiraLax (over the counter) for constipation as needed.   Diet - low sodium heart healthy    Complete by:  As directed    Driving restrictions    Complete by:  As directed    No driving for 2 weeks   Follow the hip precautions as taught in Physical Therapy    Complete by:  As directed    Increase activity slowly as tolerated    Complete by:  As directed    Partial weight bearing    Complete by:  As directed    % Body Weight:  50   Laterality:  left   Extremity:  Lower   Patient may shower    Complete by:  As directed    You may shower without a dressing once there is no drainage.  Do not wash over the wound.  If drainage remains, cover wound with plastic wrap and then shower.      Follow-up Information    [name], MD Follow up in 2 week(s).   Specialty:  Orthopedic Surgery Contact information: [place] [address] [phone]        [facility] Follow up.   Why:  A representative from [facility] will contact you to arrange start date and time for your therapy. Should you not receive a call within 24hrs of discharge call [phone]. Contact information: [address] [place] [phone]           Signed: Emmilynn Marut R 12/27/2016, 8:11 AM

## 2016-12-27 NOTE — Progress Notes (Signed)
Physical Therapy Treatment Patient Details Name: Jonathan Harvey MRN: [id number] DOB: [birth year] Today's Date: 12/27/2016    History of Present Illness Patient is a 71 y/o male admitted for L THA acetabular revision and repair of greater trochanter fx.  PMH positive for L THA revision 04/2016, R TKA 08/2016, prostate CA, Renal CA, Hepatitis.    PT Comments    Upon entering room pt receiving meds from RN. Demonstrated ascending/descending stairs with walker. Pt. Able to ascend/decend stairs with cues for safe hand and foot placement. Was able to self-correct technique. Wife was not present; gave pt handout for proper stair technique. Pt. able to maintain WB status while ambulating. Cues to widen BOS and correct posture. Pt requested assistance to bathroom upon return to room. Pt able to use elevated toilet seat with min guard for safety. Pt able to maintain WB status while brushing teeth and washing hands at sink. Educated on hip precautions.    Follow Up Recommendations  Home health PT     Equipment Recommendations  None recommended by PT    Recommendations for Other Services       Precautions / Restrictions Precautions Precautions: Fall;Posterior Hip Restrictions Weight Bearing Restrictions: Yes LLE Weight Bearing: Partial weight bearing LLE Partial Weight Bearing Percentage or Pounds: 50%    Mobility  Bed Mobility Overal bed mobility: Needs Assistance Bed Mobility: Supine to Sit     Supine to sit: Min assist     General bed mobility comments: Required assistance with moving his L LE off EOB.  Transfers Overall transfer level: Needs assistance Equipment used: Rolling walker (2 wheeled) Transfers: Sit to/from Stand Sit to Stand: Min guard         General transfer comment: Pt verbalized technique and performed safely. Min guard for safety.  Ambulation/Gait Ambulation/Gait assistance: Supervision Ambulation Distance (Feet): 300 Feet Assistive device: Rolling walker (2  wheeled) Gait Pattern/deviations: Step-to pattern;Decreased stride length;Trunk flexed;Narrow base of support     General Gait Details: Pt able to correctly perform step to sequence and maintain PWB. Required VCs for posture and to increase BOS.    Stairs Stairs: Yes   Stair Management: Step to pattern;Backwards;With walker Number of Stairs: 6 General stair comments: Demonstrated fot pt correct way to go up/down stairs with walker. Pt. required cues for safe hand and foot placement. Pt verbalized correct sequence as was completing task. Min assist to hold walker.  Wheelchair Mobility    Modified Rankin (Stroke Patients Only)       Balance Overall balance assessment: Needs assistance   Sitting balance-Leahy Scale: Good     Standing balance support: During functional activity;No upper extremity supported Standing balance-Leahy Scale: Fair Standing balance comment: Standing at sink for washing hands, brushing teeth able to maintain PWB.                            Cognition Arousal/Alertness: Awake/alert Behavior During Therapy: WFL for tasks assessed/performed Overall Cognitive Status: Within Functional Limits for tasks assessed                                        Exercises      General Comments        Pertinent Vitals/Pain Pain Assessment: No/denies pain Pain Intervention(s): Monitored during session;Repositioned    Home Living  Prior Function            PT Goals (current goals can now be found in the care plan section) Acute Rehab PT Goals Patient Stated Goal: To go home and be able to get down step to his sunroom PT Goal Formulation: With patient Time For Goal Achievement: 12/28/16 Potential to Achieve Goals: Good Progress towards PT goals: Progressing toward goals    Frequency    7X/week      PT Plan Current plan remains appropriate    Co-evaluation              AM-PAC PT "6  Clicks" Daily Activity  Outcome Measure  Difficulty turning over in bed (including adjusting bedclothes, sheets and blankets)?: A Lot Difficulty moving from lying on back to sitting on the side of the bed? : A Lot Difficulty sitting down on and standing up from a chair with arms (e.g., wheelchair, bedside commode, etc,.)?: A Lot Help needed moving to and from a bed to chair (including a wheelchair)?: A Little Help needed walking in hospital room?: A Little Help needed climbing 3-5 steps with a railing? : A Little 6 Click Score: 15    End of Session Equipment Utilized During Treatment: Gait belt Activity Tolerance: Patient tolerated treatment well Patient left: in chair;with call bell/phone within reach Nurse Communication: Mobility status PT Visit Diagnosis: Pain;Other abnormalities of gait and mobility (R26.89) Pain - Right/Left: Left Pain - part of body: Hip     Time: 9381-0175 PT Time Calculation (min) (ACUTE ONLY): 52 min  Charges:  $Gait Training: 23-37 mins $Therapeutic Activity: 8-22 mins                    G Codes:       Janna Arch, SPTA   Janna Arch 12/27/2016, 12:30 PM

## 2016-12-30 LAB — AEROBIC/ANAEROBIC CULTURE W GRAM STAIN (SURGICAL/DEEP WOUND)

## 2016-12-30 LAB — AEROBIC/ANAEROBIC CULTURE (SURGICAL/DEEP WOUND): CULTURE: NO GROWTH

## 2017-01-22 NOTE — Patient Instructions (Addendum)
Jonathan Harvey  01/22/2017   Your procedure is scheduled on: 01-30-17   Report to [facility] Main  Entrance Take [place] Elevators to 3rd floor to  [place] at 5:30 AM.   Call this number if you have problems the morning of surgery [phone]    Remember: ONLY 1 PERSON MAY GO WITH YOU TO SHORT STAY TO GET  READY MORNING OF [place].  Do not eat food or drink liquids :After Midnight.     Take these medicines the morning of surgery with A SIP OF WATER: Metoprolol Succinate (Toprol-XL) and Gabapentin (Neurontin)                               You may not have any metal on your body including hair pins and              piercings  Do not wear jewelry, make-up, lotions, powders , deodorant             Men may shave face and neck.   Do not bring valuables to the hospital. [place].  Contacts, dentures or bridgework may not be worn into surgery.  Leave suitcase in the car. After surgery it may be brought to your room.   Follow your bowel prep as instructed by your provider              Please read over the following fact sheets you were given: _____________________________________________________________________             [facility] - Preparing for Surgery Before surgery, you can play an important role.  Because skin is not sterile, your skin needs to be as free of germs as possible.  You can reduce the number of germs on your skin by washing with CHG (chlorahexidine gluconate) soap before surgery.  CHG is an antiseptic cleaner which kills germs and bonds with the skin to continue killing germs even after washing. Please DO NOT use if you have an allergy to CHG or antibacterial soaps.  If your skin becomes reddened/irritated stop using the CHG and inform your nurse when you arrive at Short Stay. Do not shave (including legs and underarms) for at least 48 hours prior to the first CHG shower.  You may  shave your face/neck. Please follow these instructions carefully:  1.  Shower with CHG Soap the night before surgery and the  morning of Surgery.  2.  If you choose to wash your hair, wash your hair first as usual with your  normal  shampoo.  3.  After you shampoo, rinse your hair and body thoroughly to remove the  shampoo.                           4.  Use CHG as you would any other liquid soap.  You can apply chg directly  to the skin and wash                       Gently with a scrungie or clean washcloth.  5.  Apply the CHG Soap to your body ONLY FROM THE NECK DOWN.   Do not use on face/ open  Wound or open sores. Avoid contact with eyes, ears mouth and genitals (private parts).                       Wash face,  Genitals (private parts) with your normal soap.             6.  Wash thoroughly, paying special attention to the area where your surgery  will be performed.  7.  Thoroughly rinse your body with warm water from the neck down.  8.  DO NOT shower/wash with your normal soap after using and rinsing off  the CHG Soap.                9.  Pat yourself dry with a clean towel.            10.  Wear clean pajamas.            11.  Place clean sheets on your bed the night of your first shower and do not  sleep with pets. Day of Surgery : Do not apply any lotions/deodorants the morning of surgery.  Please wear clean clothes to the hospital/surgery center.  FAILURE TO FOLLOW THESE INSTRUCTIONS MAY RESULT IN THE CANCELLATION OF YOUR SURGERY PATIENT SIGNATURE_________________________________  NURSE SIGNATURE__________________________________  ________________________________________________________________________  WHAT IS A BLOOD TRANSFUSION? Blood Transfusion Information  A transfusion is the replacement of blood or some of its parts. Blood is made up of multiple cells which provide different functions.  Red blood cells carry oxygen and are used for blood loss  replacement.  White blood cells fight against infection.  Platelets control bleeding.  Plasma helps clot blood.  Other blood products are available for specialized needs, such as hemophilia or other clotting disorders. BEFORE THE TRANSFUSION  Who gives blood for transfusions?   Healthy volunteers who are fully evaluated to make sure their blood is safe. This is blood bank blood. Transfusion therapy is the safest it has ever been in the practice of medicine. Before blood is taken from a donor, a complete history is taken to make sure that person has no history of diseases nor engages in risky social behavior (examples are intravenous drug use or sexual activity with multiple partners). The donor's travel history is screened to minimize risk of transmitting infections, such as malaria. The donated blood is tested for signs of infectious diseases, such as HIV and hepatitis. The blood is then tested to be sure it is compatible with you in order to minimize the chance of a transfusion reaction. If you or a relative donates blood, this is often done in anticipation of surgery and is not appropriate for emergency situations. It takes many days to process the donated blood. RISKS AND COMPLICATIONS Although transfusion therapy is very safe and saves many lives, the main dangers of transfusion include:   Getting an infectious disease.  Developing a transfusion reaction. This is an allergic reaction to something in the blood you were given. Every precaution is taken to prevent this. The decision to have a blood transfusion has been considered carefully by your caregiver before blood is given. Blood is not given unless the benefits outweigh the risks. AFTER THE TRANSFUSION  Right after receiving a blood transfusion, you will usually feel much better and more energetic. This is especially true if your red blood cells have gotten low (anemic). The transfusion raises the level of the red blood cells which  carry oxygen, and this usually causes an energy increase.  The nurse administering the transfusion will monitor you carefully for complications. HOME CARE INSTRUCTIONS  No special instructions are needed after a transfusion. You may find your energy is better. Speak with your caregiver about any limitations on activity for underlying diseases you may have. SEEK MEDICAL CARE IF:   Your condition is not improving after your transfusion.  You develop redness or irritation at the intravenous (IV) site. SEEK IMMEDIATE MEDICAL CARE IF:  Any of the following symptoms occur over the next 12 hours:  Shaking chills.  You have a temperature by mouth above 102 F (38.9 C), not controlled by medicine.  Chest, back, or muscle pain.  People around you feel you are not acting correctly or are confused.  Shortness of breath or difficulty breathing.  Dizziness and fainting.  You get a rash or develop hives.  You have a decrease in urine output.  Your urine turns a dark color or changes to pink, red, or brown. Any of the following symptoms occur over the next 10 days:  You have a temperature by mouth above 102 F (38.9 C), not controlled by medicine.  Shortness of breath.  Weakness after normal activity.  The white part of the eye turns yellow (jaundice).  You have a decrease in the amount of urine or are urinating less often.  Your urine turns a dark color or changes to pink, red, or brown. Document Released: 02/16/2000 Document Revised: 05/13/2011 Document Reviewed: 10/05/2007 [facility] Patient Information 2014 [place], [place].  _______________________________________________________________________

## 2017-01-22 NOTE — Progress Notes (Signed)
04-08-16 (Epic) EKG, CXR

## 2017-01-28 ENCOUNTER — Encounter (HOSPITAL_COMMUNITY): Payer: Self-pay

## 2017-01-28 ENCOUNTER — Other Ambulatory Visit: Payer: Self-pay

## 2017-01-28 ENCOUNTER — Encounter (HOSPITAL_COMMUNITY)
Admission: RE | Admit: 2017-01-28 | Discharge: 2017-01-28 | Disposition: A | Discharge status: Home / Self Care | Payer: Medicare Other | Source: Ambulatory Visit | Attending: Urology | Admitting: Urology

## 2017-01-28 DIAGNOSIS — N289 Disorder of kidney and ureter, unspecified: Secondary | ICD-10-CM

## 2017-01-28 DIAGNOSIS — Z01812 Encounter for preprocedural laboratory examination: Secondary | ICD-10-CM

## 2017-01-28 LAB — CBC
HCT: 31.9 % — ABNORMAL LOW (ref 39.0–52.0)
Hemoglobin: 10.3 g/dL — ABNORMAL LOW (ref 13.0–17.0)
MCH: 29.3 pg (ref 26.0–34.0)
MCHC: 32.3 g/dL (ref 30.0–36.0)
MCV: 90.6 fL (ref 78.0–100.0)
PLATELETS: 367 10*3/uL (ref 150–400)
RBC: 3.52 MIL/uL — ABNORMAL LOW (ref 4.22–5.81)
RDW: 15.2 % (ref 11.5–15.5)
WBC: 4.9 10*3/uL (ref 4.0–10.5)

## 2017-01-28 LAB — COMPREHENSIVE METABOLIC PANEL
ALT: 17 U/L (ref 17–63)
ANION GAP: 8 (ref 5–15)
AST: 26 U/L (ref 15–41)
Albumin: 3.7 g/dL (ref 3.5–5.0)
Alkaline Phosphatase: 138 U/L — ABNORMAL HIGH (ref 38–126)
BUN: 15 mg/dL (ref 6–20)
CHLORIDE: 101 mmol/L (ref 101–111)
CO2: 30 mmol/L (ref 22–32)
Calcium: 9.6 mg/dL (ref 8.9–10.3)
Creatinine, Ser: 1.18 mg/dL (ref 0.61–1.24)
Glucose, Bld: 100 mg/dL — ABNORMAL HIGH (ref 65–99)
POTASSIUM: 3.6 mmol/L (ref 3.5–5.1)
Sodium: 139 mmol/L (ref 135–145)
Total Bilirubin: 0.5 mg/dL (ref 0.3–1.2)
Total Protein: 6.9 g/dL (ref 6.5–8.1)

## 2017-01-28 LAB — ABO/RH: ABO/RH(D): A POS

## 2017-01-29 LAB — URINE CULTURE: CULTURE: NO GROWTH

## 2017-01-30 ENCOUNTER — Other Ambulatory Visit: Payer: Self-pay

## 2017-01-30 ENCOUNTER — Inpatient Hospital Stay (HOSPITAL_COMMUNITY): Payer: Medicare Other | Admitting: Anesthesiology

## 2017-01-30 ENCOUNTER — Inpatient Hospital Stay (HOSPITAL_COMMUNITY): Payer: Medicare Other

## 2017-01-30 ENCOUNTER — Encounter (HOSPITAL_COMMUNITY): Payer: Self-pay | Admitting: *Deleted

## 2017-01-30 ENCOUNTER — Encounter (HOSPITAL_COMMUNITY)
Admission: RE | Disposition: A | Discharge status: Home / Self Care | Payer: Self-pay | Source: Ambulatory Visit | Attending: Urology

## 2017-01-30 ENCOUNTER — Inpatient Hospital Stay (HOSPITAL_COMMUNITY)
Admission: RE | Admit: 2017-01-30 | Discharge: 2017-02-01 | DRG: 657 | Disposition: A | Discharge status: Home / Self Care | Payer: Medicare Other | Source: Ambulatory Visit | Attending: Urology | Admitting: Urology

## 2017-01-30 DIAGNOSIS — Z800 Family history of malignant neoplasm of digestive organs: Secondary | ICD-10-CM

## 2017-01-30 DIAGNOSIS — M549 Dorsalgia, unspecified: Secondary | ICD-10-CM | POA: Diagnosis present

## 2017-01-30 DIAGNOSIS — C641 Malignant neoplasm of right kidney, except renal pelvis: Principal | ICD-10-CM | POA: Diagnosis present

## 2017-01-30 DIAGNOSIS — R911 Solitary pulmonary nodule: Secondary | ICD-10-CM | POA: Diagnosis present

## 2017-01-30 DIAGNOSIS — Z96651 Presence of right artificial knee joint: Secondary | ICD-10-CM | POA: Diagnosis present

## 2017-01-30 DIAGNOSIS — Z0183 Encounter for blood typing: Secondary | ICD-10-CM | POA: Diagnosis not present

## 2017-01-30 DIAGNOSIS — M1990 Unspecified osteoarthritis, unspecified site: Secondary | ICD-10-CM | POA: Diagnosis present

## 2017-01-30 DIAGNOSIS — Z01812 Encounter for preprocedural laboratory examination: Secondary | ICD-10-CM | POA: Diagnosis not present

## 2017-01-30 DIAGNOSIS — G8929 Other chronic pain: Secondary | ICD-10-CM | POA: Diagnosis present

## 2017-01-30 DIAGNOSIS — Z9079 Acquired absence of other genital organ(s): Secondary | ICD-10-CM | POA: Diagnosis not present

## 2017-01-30 DIAGNOSIS — D62 Acute posthemorrhagic anemia: Secondary | ICD-10-CM | POA: Diagnosis not present

## 2017-01-30 DIAGNOSIS — Z806 Family history of leukemia: Secondary | ICD-10-CM

## 2017-01-30 DIAGNOSIS — Z87891 Personal history of nicotine dependence: Secondary | ICD-10-CM | POA: Diagnosis not present

## 2017-01-30 DIAGNOSIS — I10 Essential (primary) hypertension: Secondary | ICD-10-CM | POA: Diagnosis present

## 2017-01-30 DIAGNOSIS — Z7982 Long term (current) use of aspirin: Secondary | ICD-10-CM

## 2017-01-30 DIAGNOSIS — Z96642 Presence of left artificial hip joint: Secondary | ICD-10-CM | POA: Diagnosis present

## 2017-01-30 DIAGNOSIS — E781 Pure hyperglyceridemia: Secondary | ICD-10-CM | POA: Diagnosis present

## 2017-01-30 DIAGNOSIS — N2889 Other specified disorders of kidney and ureter: Secondary | ICD-10-CM | POA: Diagnosis present

## 2017-01-30 DIAGNOSIS — Z96649 Presence of unspecified artificial hip joint: Secondary | ICD-10-CM

## 2017-01-30 DIAGNOSIS — Z91011 Allergy to milk products: Secondary | ICD-10-CM

## 2017-01-30 DIAGNOSIS — M2550 Pain in unspecified joint: Secondary | ICD-10-CM | POA: Diagnosis present

## 2017-01-30 HISTORY — PX: ROBOTIC ASSITED PARTIAL NEPHRECTOMY: SHX6087

## 2017-01-30 LAB — TYPE AND SCREEN
ABO/RH(D): A POS
ANTIBODY SCREEN: NEGATIVE

## 2017-01-30 LAB — HEMOGLOBIN AND HEMATOCRIT, BLOOD
HEMATOCRIT: 30 % — AB (ref 39.0–52.0)
HEMOGLOBIN: 9.8 g/dL — AB (ref 13.0–17.0)

## 2017-01-30 SURGERY — ROBOTIC ASSITED PARTIAL NEPHRECTOMY
Anesthesia: General | Laterality: Right

## 2017-01-30 MED ORDER — PHENYLEPHRINE 40 MCG/ML (10ML) SYRINGE FOR IV PUSH (FOR BLOOD PRESSURE SUPPORT)
PREFILLED_SYRINGE | INTRAVENOUS | Status: AC
  Filled 2017-01-30: qty 10

## 2017-01-30 MED ORDER — ROCURONIUM BROMIDE 100 MG/10ML IV SOLN
INTRAVENOUS | Status: DC | PRN
  Administered 2017-01-30 (×2): 20 mg via INTRAVENOUS
  Administered 2017-01-30: 10 mg via INTRAVENOUS
  Administered 2017-01-30: 50 mg via INTRAVENOUS
  Administered 2017-01-30: 10 mg via INTRAVENOUS

## 2017-01-30 MED ORDER — SIMVASTATIN 20 MG PO TABS
20.0000 mg | ORAL_TABLET | Freq: Every day | ORAL | Status: DC
  Administered 2017-01-30 – 2017-01-31 (×2): 20 mg via ORAL
  Filled 2017-01-30 (×2): qty 1

## 2017-01-30 MED ORDER — DEXTROSE-NACL 5-0.45 % IV SOLN
INTRAVENOUS | Status: DC
  Administered 2017-01-30 – 2017-01-31 (×2): via INTRAVENOUS

## 2017-01-30 MED ORDER — ONDANSETRON HCL 4 MG/2ML IJ SOLN
INTRAMUSCULAR | Status: AC
  Filled 2017-01-30: qty 2

## 2017-01-30 MED ORDER — SODIUM CHLORIDE 0.9 % IJ SOLN
INTRAMUSCULAR | Status: DC | PRN
  Administered 2017-01-30: 40 mL

## 2017-01-30 MED ORDER — STERILE WATER FOR IRRIGATION IR SOLN
Status: DC | PRN
  Administered 2017-01-30: 1000 mL

## 2017-01-30 MED ORDER — SUGAMMADEX SODIUM 200 MG/2ML IV SOLN
INTRAVENOUS | Status: DC | PRN
  Administered 2017-01-30: 200 mg via INTRAVENOUS

## 2017-01-30 MED ORDER — PHENYLEPHRINE HCL 10 MG/ML IJ SOLN
INTRAMUSCULAR | Status: DC | PRN
  Administered 2017-01-30: 40 ug via INTRAVENOUS
  Administered 2017-01-30 (×6): 80 ug via INTRAVENOUS

## 2017-01-30 MED ORDER — CEFAZOLIN SODIUM-DEXTROSE 2-3 GM-%(50ML) IV SOLR
INTRAVENOUS | Status: DC | PRN
  Administered 2017-01-30 (×2): 2 g via INTRAVENOUS

## 2017-01-30 MED ORDER — KETOROLAC TROMETHAMINE 0.5 % OP SOLN
1.0000 [drp] | Freq: Three times a day (TID) | OPHTHALMIC | Status: AC | PRN
  Administered 2017-01-30: 1 [drp] via OPHTHALMIC
  Filled 2017-01-30: qty 5

## 2017-01-30 MED ORDER — GABAPENTIN 300 MG PO CAPS
300.0000 mg | ORAL_CAPSULE | Freq: Two times a day (BID) | ORAL | Status: DC
  Administered 2017-01-31 – 2017-02-01 (×3): 300 mg via ORAL
  Filled 2017-01-30 (×3): qty 1

## 2017-01-30 MED ORDER — LACTATED RINGERS IV SOLN
INTRAVENOUS | Status: DC | PRN
  Administered 2017-01-30 (×2): via INTRAVENOUS

## 2017-01-30 MED ORDER — BSS IO SOLN
15.0000 mL | Freq: Once | INTRAOCULAR | Status: AC
  Administered 2017-01-30: 15 mL
  Filled 2017-01-30: qty 15

## 2017-01-30 MED ORDER — ROCURONIUM BROMIDE 50 MG/5ML IV SOSY
PREFILLED_SYRINGE | INTRAVENOUS | Status: AC
  Filled 2017-01-30: qty 5

## 2017-01-30 MED ORDER — EPHEDRINE 5 MG/ML INJ
INTRAVENOUS | Status: AC
  Filled 2017-01-30: qty 10

## 2017-01-30 MED ORDER — ACETAMINOPHEN 10 MG/ML IV SOLN
1000.0000 mg | Freq: Four times a day (QID) | INTRAVENOUS | Status: AC
  Administered 2017-01-30 – 2017-01-31 (×3): 1000 mg via INTRAVENOUS
  Filled 2017-01-30 (×4): qty 100

## 2017-01-30 MED ORDER — EPHEDRINE SULFATE 50 MG/ML IJ SOLN
INTRAMUSCULAR | Status: DC | PRN
  Administered 2017-01-30 (×4): 5 mg via INTRAVENOUS
  Administered 2017-01-30: 10 mg via INTRAVENOUS

## 2017-01-30 MED ORDER — BUPIVACAINE LIPOSOME 1.3 % IJ SUSP
20.0000 mL | Freq: Once | INTRAMUSCULAR | Status: AC
  Administered 2017-01-30: 20 mL
  Filled 2017-01-30: qty 20

## 2017-01-30 MED ORDER — BUPIVACAINE-EPINEPHRINE 0.5% -1:200000 IJ SOLN
INTRAMUSCULAR | Status: DC | PRN
  Administered 2017-01-30: 10 mL

## 2017-01-30 MED ORDER — PROPOFOL 10 MG/ML IV BOLUS
INTRAVENOUS | Status: AC
  Filled 2017-01-30: qty 20

## 2017-01-30 MED ORDER — TRAMADOL HCL 50 MG PO TABS
100.0000 mg | ORAL_TABLET | Freq: Four times a day (QID) | ORAL | Status: DC | PRN
  Administered 2017-01-31 (×2): 100 mg via ORAL
  Filled 2017-01-30 (×2): qty 2

## 2017-01-30 MED ORDER — DEXAMETHASONE SODIUM PHOSPHATE 10 MG/ML IJ SOLN
INTRAMUSCULAR | Status: AC
  Filled 2017-01-30: qty 1

## 2017-01-30 MED ORDER — DEXAMETHASONE SODIUM PHOSPHATE 10 MG/ML IJ SOLN
INTRAMUSCULAR | Status: DC | PRN
  Administered 2017-01-30: 10 mg via INTRAVENOUS

## 2017-01-30 MED ORDER — TRAMADOL HCL 50 MG PO TABS: 50.0000 mg | ORAL_TABLET | Freq: Four times a day (QID) | ORAL | 0 refills | Status: DC | PRN

## 2017-01-30 MED ORDER — POLYMYXIN B-TRIMETHOPRIM 10000-0.1 UNIT/ML-% OP SOLN
1.0000 [drp] | Freq: Three times a day (TID) | OPHTHALMIC | Status: AC
  Administered 2017-01-30 – 2017-01-31 (×3): 1 [drp] via OPHTHALMIC
  Filled 2017-01-30: qty 10

## 2017-01-30 MED ORDER — MIDAZOLAM HCL 2 MG/2ML IJ SOLN
INTRAMUSCULAR | Status: AC
  Filled 2017-01-30: qty 2

## 2017-01-30 MED ORDER — MIDAZOLAM HCL 5 MG/5ML IJ SOLN
INTRAMUSCULAR | Status: DC | PRN
  Administered 2017-01-30: 2 mg via INTRAVENOUS

## 2017-01-30 MED ORDER — PROPOFOL 10 MG/ML IV BOLUS
INTRAVENOUS | Status: DC | PRN
  Administered 2017-01-30: 40 mg via INTRAVENOUS
  Administered 2017-01-30: 160 mg via INTRAVENOUS

## 2017-01-30 MED ORDER — LIDOCAINE 2% (20 MG/ML) 5 ML SYRINGE
INTRAMUSCULAR | Status: AC
  Filled 2017-01-30: qty 5

## 2017-01-30 MED ORDER — HYDROMORPHONE HCL 1 MG/ML IJ SOLN
INTRAMUSCULAR | Status: AC
  Filled 2017-01-30: qty 1

## 2017-01-30 MED ORDER — HYDROMORPHONE HCL 1 MG/ML IJ SOLN: 0.2500 mg | INTRAMUSCULAR | Status: DC | PRN

## 2017-01-30 MED ORDER — SODIUM CHLORIDE 0.9 % IJ SOLN
INTRAMUSCULAR | Status: AC
  Filled 2017-01-30: qty 20

## 2017-01-30 MED ORDER — TRAMADOL HCL 50 MG PO TABS: 50.0000 mg | ORAL_TABLET | Freq: Four times a day (QID) | ORAL | Status: DC | PRN

## 2017-01-30 MED ORDER — FENTANYL CITRATE (PF) 100 MCG/2ML IJ SOLN
INTRAMUSCULAR | Status: DC | PRN
  Administered 2017-01-30: 25 ug via INTRAVENOUS
  Administered 2017-01-30 (×2): 50 ug via INTRAVENOUS
  Administered 2017-01-30: 25 ug via INTRAVENOUS
  Administered 2017-01-30: 50 ug via INTRAVENOUS

## 2017-01-30 MED ORDER — DIPHENHYDRAMINE HCL 50 MG/ML IJ SOLN: 12.5000 mg | Freq: Four times a day (QID) | INTRAMUSCULAR | Status: DC | PRN

## 2017-01-30 MED ORDER — ONDANSETRON HCL 4 MG/2ML IJ SOLN
INTRAMUSCULAR | Status: DC | PRN
  Administered 2017-01-30: 4 mg via INTRAVENOUS

## 2017-01-30 MED ORDER — HYDROMORPHONE HCL 1 MG/ML IJ SOLN: 0.5000 mg | INTRAMUSCULAR | Status: DC | PRN

## 2017-01-30 MED ORDER — FENTANYL CITRATE (PF) 250 MCG/5ML IJ SOLN
INTRAMUSCULAR | Status: AC
  Filled 2017-01-30: qty 5

## 2017-01-30 MED ORDER — LIDOCAINE HCL (CARDIAC) 20 MG/ML IV SOLN
INTRAVENOUS | Status: DC | PRN
  Administered 2017-01-30: 60 mg via INTRAVENOUS

## 2017-01-30 MED ORDER — ONDANSETRON HCL 4 MG/2ML IJ SOLN: 4.0000 mg | INTRAMUSCULAR | Status: DC | PRN

## 2017-01-30 MED ORDER — CEFAZOLIN SODIUM-DEXTROSE 2-4 GM/100ML-% IV SOLN
INTRAVENOUS | Status: AC
  Filled 2017-01-30: qty 100

## 2017-01-30 MED ORDER — GABAPENTIN 300 MG PO CAPS: 300.0000 mg | ORAL_CAPSULE | Freq: Every evening | ORAL | Status: DC | PRN

## 2017-01-30 MED ORDER — LACTATED RINGERS IR SOLN
Status: DC | PRN
  Administered 2017-01-30: 1000 mL

## 2017-01-30 MED ORDER — METOPROLOL SUCCINATE ER 25 MG PO TB24
25.0000 mg | ORAL_TABLET | Freq: Every day | ORAL | Status: DC
  Administered 2017-01-31 – 2017-02-01 (×2): 25 mg via ORAL
  Filled 2017-01-30 (×2): qty 1

## 2017-01-30 MED ORDER — DIPHENHYDRAMINE HCL 12.5 MG/5ML PO ELIX
12.5000 mg | ORAL_SOLUTION | Freq: Four times a day (QID) | ORAL | Status: DC | PRN
  Administered 2017-02-01: 12.5 mg via ORAL
  Filled 2017-01-30: qty 5

## 2017-01-30 MED ORDER — PROMETHAZINE HCL 25 MG/ML IJ SOLN: 6.2500 mg | INTRAMUSCULAR | Status: DC | PRN

## 2017-01-30 SURGICAL SUPPLY — 73 items
ADH SKN CLS APL DERMABOND .7 (GAUZE/BANDAGES/DRESSINGS) ×1
AGENT HMST KT MTR STRL THRMB (HEMOSTASIS)
AGENT HMST MTR 8 SURGIFLO (HEMOSTASIS)
APL ESCP 34 STRL LF DISP (HEMOSTASIS) ×1
APL SRG 38 LTWT LNG FL B (MISCELLANEOUS)
APPLICATOR ARISTA FLEXITIP XL (MISCELLANEOUS) IMPLANT
APPLICATOR SURGIFLO ENDO (HEMOSTASIS) ×2 IMPLANT
BAG SPEC RTRVL LRG 6X4 10 (ENDOMECHANICALS) ×1
CHLORAPREP W/TINT 26ML (MISCELLANEOUS) ×3 IMPLANT
CLIP VESOLOCK LG 6/CT PURPLE (CLIP) ×5 IMPLANT
CLIP VESOLOCK MED LG 6/CT (CLIP) ×4 IMPLANT
CLIP VESOLOCK XL 6/CT (CLIP) ×2 IMPLANT
CONT SPEC 4OZ CLIKSEAL STRL BL (MISCELLANEOUS) ×2 IMPLANT
COVER SURGICAL LIGHT HANDLE (MISCELLANEOUS) ×3 IMPLANT
COVER TIP SHEARS 8 DVNC (MISCELLANEOUS) ×1 IMPLANT
COVER TIP SHEARS 8MM DA VINCI (MISCELLANEOUS) ×4
DECANTER SPIKE VIAL GLASS SM (MISCELLANEOUS) ×3 IMPLANT
DERMABOND ADVANCED (GAUZE/BANDAGES/DRESSINGS) ×2
DERMABOND ADVANCED .7 DNX12 (GAUZE/BANDAGES/DRESSINGS) ×1 IMPLANT
DRAIN CHANNEL 15F RND FF 3/16 (WOUND CARE) ×3 IMPLANT
DRAPE ARM DVNC X/XI (DISPOSABLE) ×4 IMPLANT
DRAPE COLUMN DVNC XI (DISPOSABLE) ×1 IMPLANT
DRAPE DA VINCI XI ARM (DISPOSABLE) ×8
DRAPE DA VINCI XI COLUMN (DISPOSABLE) ×2
DRAPE INCISE IOBAN 66X45 STRL (DRAPES) ×3 IMPLANT
DRAPE SHEET LG 3/4 BI-LAMINATE (DRAPES) ×3 IMPLANT
DRSG TEGADERM 4X4.75 (GAUZE/BANDAGES/DRESSINGS) ×2 IMPLANT
ELECT PENCIL ROCKER SW 15FT (MISCELLANEOUS) ×3 IMPLANT
ELECT REM PT RETURN 15FT ADLT (MISCELLANEOUS) ×6 IMPLANT
EVACUATOR SILICONE 100CC (DRAIN) ×3 IMPLANT
FLOSEAL 10ML (HEMOSTASIS) ×2 IMPLANT
GAUZE SPONGE 2X2 8PLY STRL LF (GAUZE/BANDAGES/DRESSINGS) IMPLANT
GLOVE BIO SURGEON STRL SZ 6.5 (GLOVE) ×2 IMPLANT
GLOVE BIO SURGEONS STRL SZ 6.5 (GLOVE) ×1
GLOVE BIOGEL M STRL SZ7.5 (GLOVE) ×6 IMPLANT
GOWN STRL REUS W/TWL LRG LVL3 (GOWN DISPOSABLE) ×9 IMPLANT
HEMOSTAT ARISTA ABSORB 3G PWDR (MISCELLANEOUS) IMPLANT
IRRIG SUCT STRYKERFLOW 2 WTIP (MISCELLANEOUS) ×3
IRRIGATION SUCT STRKRFLW 2 WTP (MISCELLANEOUS) IMPLANT
KIT BASIN OR (CUSTOM PROCEDURE TRAY) ×3 IMPLANT
LOOP VESSEL MAXI BLUE (MISCELLANEOUS) ×2 IMPLANT
MARKER SKIN DUAL TIP RULER LAB (MISCELLANEOUS) ×3 IMPLANT
PORT ACCESS TROCAR AIRSEAL 12 (TROCAR) ×1 IMPLANT
PORT ACCESS TROCAR AIRSEAL 5M (TROCAR) ×2
POSITIONER SURGICAL ARM (MISCELLANEOUS) ×6 IMPLANT
POUCH SPECIMEN RETRIEVAL 10MM (ENDOMECHANICALS) ×3 IMPLANT
SEAL CANN UNIV 5-8 DVNC XI (MISCELLANEOUS) ×4 IMPLANT
SEAL XI 5MM-8MM UNIVERSAL (MISCELLANEOUS) ×8
SET TRI-LUMEN FLTR TB AIRSEAL (TUBING) ×3 IMPLANT
SOLUTION ELECTROLUBE (MISCELLANEOUS) ×3 IMPLANT
SPOGE SURGIFLO 8M (HEMOSTASIS)
SPONGE GAUZE 2X2 STER 10/PKG (GAUZE/BANDAGES/DRESSINGS) ×2
SPONGE SURGIFLO 8M (HEMOSTASIS) IMPLANT
SURGIFLO W/THROMBIN 8M KIT (HEMOSTASIS) ×1 IMPLANT
SUT ETHILON 3 0 PS 1 (SUTURE) ×3 IMPLANT
SUT MNCRL AB 4-0 PS2 18 (SUTURE) ×6 IMPLANT
SUT PDS AB 0 CT1 36 (SUTURE) ×2 IMPLANT
SUT SILK 3 0 SH 30 (SUTURE) ×2 IMPLANT
SUT V-LOC BARB 180 2/0GR6 GS22 (SUTURE) ×6
SUT VIC AB 0 CT1 27 (SUTURE) ×3
SUT VIC AB 0 CT1 27XBRD ANTBC (SUTURE) ×1 IMPLANT
SUT VIC AB 3-0 SH 27 (SUTURE) ×3
SUT VIC AB 3-0 SH 27XBRD (SUTURE) IMPLANT
SUT VICRYL 0 UR6 27IN ABS (SUTURE) IMPLANT
SUT VLOC BARB 180 ABS3/0GR12 (SUTURE) ×6
SUTURE V-LC BRB 180 2/0GR6GS22 (SUTURE) ×1 IMPLANT
SUTURE VLOC BRB 180 ABS3/0GR12 (SUTURE) ×1 IMPLANT
TOWEL OR 17X26 10 PK STRL BLUE (TOWEL DISPOSABLE) ×3 IMPLANT
TOWEL OR NON WOVEN STRL DISP B (DISPOSABLE) ×3 IMPLANT
TRAY FOLEY W/METER SILVER 16FR (SET/KITS/TRAYS/PACK) ×3 IMPLANT
TRAY LAPAROSCOPIC (CUSTOM PROCEDURE TRAY) ×3 IMPLANT
TROCAR BLADELESS OPT 5 100 (ENDOMECHANICALS) ×2 IMPLANT
TROCAR XCEL 12X100 BLDLESS (ENDOMECHANICALS) IMPLANT

## 2017-01-30 NOTE — Anesthesia Postprocedure Evaluation (Signed)
Anesthesia Post Note  Patient: Jonathan Harvey  Procedure(s) Performed: ROBOTIC ASSITED PARTIAL NEPHRECTOMY (Right )     Patient location during evaluation: PACU Anesthesia Type: General Level of consciousness: awake and alert Pain management: pain level controlled Vital Signs Assessment: post-procedure vital signs reviewed and stable Respiratory status: spontaneous breathing, nonlabored ventilation, respiratory function stable and patient connected to nasal cannula oxygen Cardiovascular status: blood pressure returned to baseline and stable Postop Assessment: no apparent nausea or vomiting Anesthetic complications: no    Last Vitals:  Vitals:   01/30/17 1345 01/30/17 1400  BP: 132/72 126/75  Pulse: 86 87  Resp: 18 19  Temp:    SpO2: 100% 100%    Last Pain:  Vitals:   01/30/17 1345  TempSrc:   PainSc: Asleep                 [name] S

## 2017-01-30 NOTE — Interval H&P Note (Signed)
History and Physical Interval Note:  01/30/2017 7:33 AM  Jonathan Harvey  has presented today for surgery, with the diagnosis of RIGHT RENAL MASS  The various methods of treatment have been discussed with the patient and family. After consideration of risks, benefits and other options for treatment, the patient has consented to  Procedure(s): ROBOTIC ASSITED PARTIAL NEPHRECTOMY (Right) as a surgical intervention .  The patient's history has been reviewed, patient examined, no change in status, stable for surgery.  I have reviewed the patient's chart and labs.  Questions were answered to the patient's satisfaction.     [name]

## 2017-01-30 NOTE — Op Note (Signed)
Preoperative diagnosis:  1. right renal mass   Postoperative diagnosis:  1. same   Procedure: 1. Robotic assisted laparoscopic right partial nephrectomy  Surgeon: Ardis Hughs, MD 1st assistant: Debbrah Alar, PA-C  Anesthesia: General  Complications: None  Intraoperative findings:  #1. Three arteries clamped #2. Warm ischemia time 23 minutes #3. Negative frozen margins #4. Renorraphy performed with renal fat bolster #5.  Small serosal tear of the cecum which was repaired primarily with a 3-0 silk stitch.  EBL: 350cc  Specimens: right renal mass   Indication:  Jonathan Harvey is a 71 y.o. patient with right renal mass.  After reviewing the management options for treatment, he elected to proceed with the above surgical procedure(s). We have discussed the potential benefits and risks of the procedure, side effects of the proposed treatment, the likelihood of the patient achieving the goals of the procedure, and any potential problems that might occur during the procedure or recuperation. Informed consent has been obtained.   Description of procedure:  An assistant was required for this surgical procedure.  The duties of the assistant included but were not limited to suctioning, passing suture, camera manipulation, retraction. This procedure would not be able to be performed without an Environmental consultant.  The patient was taken to the operating room and a general anesthetic was administered. The patient was given preoperative antibiotics, placed in the right modified flank position with care to pad all potential pressure points, and prepped and draped in the usual sterile fashion. Next a preoperative timeout was performed.  A site was selected on the right side of the umbilicus for placement of the camera port. This was placed using a standard modified Hassan technique with entry into the peritoneum under visual guidance.  We entered the peritoneum without incident and established  pneumoperitoneum. The camera was then used to inspect the abdomen and there was no evidence of any intra-abdominal injuries or other abnormalities. The remaining abdominal ports were then placed. 8 mm robotic ports were placed in the right upper quadrant, right lower quadrant, and far right lateral abdominal wall.  5 mm laparoscopic port was then placed just distal to the xiphoid process which we used to retract the liver.  A 12 mm port was placed in the upper midline for laparoscopic assistance. All ports were placed under direct vision without difficulty. The surgical cart was then docked.   Dissection began by taking down the cecum and small intestine/appendix off the anterior abdominal wall.  During the blunt dissection there was what appeared to be a small serosal tear in the cecum which I imbricated with a 3-0 silk stitch in figure-of-eight fashion.  Then utilizing the cautery scissors, the white line of Toldt was incised allowing the colon to be mobilized medially and the plane between the mesocolon and the anterior layer of Gerota's fascia to be developed and the kidney to be exposed. The ureter and gonadal vein were identified inferiorly and the ureter was lifted anteriorly off the psoas muscle. Dissection proceeded superiorly along the gonadal vein until the renal vein was identified. The renal hilum was then carefully isolated with a combination of blunt and sharp dissection allowing the renal arterial and venous structures to be separated and isolated in preparation for renal hilar vessel clamping.  There were 3 renal arteries, and 2 renal veins.  Attention turned to the kidney and the perinephric fat surrounding the renal mass was removed and the kidney was mobilized sufficiently for exposure and resection of the renal  mass.  Once the renal mass was properly isolated, preparations were made for resection of the tumor. The renal arteries were then clamped with a bulldog clamp. The tumor  was then excised with cold scissor dissection along with an adequate visible gross margin of normal renal parenchyma. The tumor appeared to be excised without any gross violation of the tumor.  I did at this point send 2 small areas of the base of the resection bed as frozen specimens which ultimately returned as benign renal parenchyma.  The renal collecting system was not entered during removal of the tumor. A running 3-0 V-lock suture was then brought through the capsule of the kidney and run along the base of the renal defect to provide hemostasis and close any entry into the renal collecting system if present. Weck clips were used to secure this suture outside the renal capsule at the proximal and distal ends. The bulldog clamps were then removed from the renal hilar vessel.  Next, I created a tongue of renal fat which I laid across the resection bed and placed FloSeal into the area prior to securing the fat into the bed as a bolster.  A running 2-0 V lock suture was then used to close the capsule of the kidney using a sliding clip technique which resulted in excellent hemostasis. An additional hemostatic agent (Surgiflo) was then placed into the renal defect.Surgicel was then placed over the defect.   Total warm renal ischemia time was  50minutes. The renal tumor resection site was examined. Hemostasis appeared adequate.   The kidney was placed back into its normal anatomic position and covered with perinephric fat as needed. A # 36 Blake drain was then brought through the lateral lower port site and positioned in the perinephric space. It was secured to the skin with a nylon suture. The surgical robotic cart was undocked. The assistant port was then opened up to approximately 4 cm to accommodate the specimen.  The renal tumor specimen was removed intact within an endopouch retrieval bag through the assistant port. This incision was then closed with a 2-0 PDS.   All other laparoscopic/robotic ports  were removed under direct vision and the pneumoperitoneum let down with inspection of the operative field performed and hemostasis again confirmed. All incision sites were then injected with local anesthetic and reapproximated at the skin level with 4-0 monocryl subcuticular closures. Dermabond was applied to the skin. The patient tolerated the procedure well and without complications. The patient was able to be extubated and transferred to the recovery unit in satisfactory condition.  Ardis Hughs, M.D.

## 2017-01-30 NOTE — Addendum Note (Signed)
Addendum  created 01/30/17 1507 by Myrtie Soman, MD   Order list changed, Order sets accessed

## 2017-01-30 NOTE — Anesthesia Procedure Notes (Signed)
Procedure Name: Intubation Date/Time: 01/30/2017 7:50 AM Performed by: Glory Buff, CRNA Pre-anesthesia Checklist: Patient identified, Emergency Drugs available, Suction available and Patient being monitored Patient Re-evaluated:Patient Re-evaluated prior to induction Oxygen Delivery Method: Circle system utilized Preoxygenation: Pre-oxygenation with 100% oxygen Induction Type: IV induction Ventilation: Mask ventilation without difficulty Laryngoscope Size: Mac and 3 Grade View: Grade I Tube type: Oral Tube size: 7.5 mm Number of attempts: 2 Airway Equipment and Method: Patient positioned with wedge pillow Placement Confirmation: ETT inserted through vocal cords under direct vision,  positive ETCO2 and breath sounds checked- equal and bilateral Secured at: 22 cm Tube secured with: Tape Dental Injury: Teeth and Oropharynx as per pre-operative assessment  Comments: DL attempt x1 with grade 3 view. Second DL attempt with MAC 3, grade 2 view.

## 2017-01-30 NOTE — Progress Notes (Signed)
Patient arrived to the floor from PACU and transferred to bed.  Per PACU RN, patient was asleep during time in PACU. When patient opened his eyes, he was complaining of pain in his left eye. Eye was glossy, extremely red, and painful.  It appears to have a film over the eye especially in the left lower quadrant. RN attempted to flush patient's eye with Normal Saline which he complained of intense burning and stabbing pain. RN then attempted to irrigate his eye with sterile water with the same complaints.  Each time patient would open his eye and look around the room he would wince in pain and throw his head back.  RN asked patient how his vision was and he stated that while covering his right eye everything was blurry coming from the left.  RN called the OR who put me through to anesthesia.  Spoke with Dr. Kalman Shan on the phone who put in orders. Spoke with oncoming RN and explained current status of patient and she will continue to follow.

## 2017-01-30 NOTE — Anesthesia Preprocedure Evaluation (Addendum)
Anesthesia Evaluation  Patient identified by MRN, date of birth, ID band Patient awake    Reviewed: Allergy & Precautions, NPO status , Patient's Chart, lab work & pertinent test results  Airway Mallampati: II  TM Distance: >3 FB Neck ROM: Full    Dental no notable dental hx.    Pulmonary neg pulmonary ROS, former smoker,    Pulmonary exam normal breath sounds clear to auscultation       Cardiovascular hypertension, Normal cardiovascular exam Rhythm:Regular Rate:Normal     Neuro/Psych negative neurological ROS  negative psych ROS   GI/Hepatic negative GI ROS, Neg liver ROS,   Endo/Other  negative endocrine ROS  Renal/GU negative Renal ROS  negative genitourinary   Musculoskeletal negative musculoskeletal ROS (+)   Abdominal   Peds negative pediatric ROS (+)  Hematology negative hematology ROS (+)   Anesthesia Other Findings   Reproductive/Obstetrics negative OB ROS                             Anesthesia Physical Anesthesia Plan  ASA: II  Anesthesia Plan: General   Post-op Pain Management:    Induction: Intravenous  PONV Risk Score and Plan: 2 and Dexamethasone, Ondansetron and Treatment may vary due to age or medical condition  Airway Management Planned: Oral ETT  Additional Equipment:   Intra-op Plan:   Post-operative Plan: Extubation in OR  Informed Consent: I have reviewed the patients History and Physical, chart, labs and discussed the procedure including the risks, benefits and alternatives for the proposed anesthesia with the patient or authorized representative who has indicated his/her understanding and acceptance.   Dental advisory given  Plan Discussed with: CRNA and Surgeon  Anesthesia Plan Comments:         Anesthesia Quick Evaluation

## 2017-01-30 NOTE — Transfer of Care (Signed)
Immediate Anesthesia Transfer of Care Note  Patient: Jonathan Harvey  Procedure(s) Performed: ROBOTIC ASSITED PARTIAL NEPHRECTOMY (Right )  Patient Location: PACU  Anesthesia Type:General  Level of Consciousness: awake, alert  and oriented  Airway & Oxygen Therapy: Patient Spontanous Breathing and Patient connected to face mask oxygen  Post-op Assessment: Report given to RN and Post -op Vital signs reviewed and stable  Post vital signs: Reviewed and stable  Last Vitals:  Vitals:   01/30/17 0540 01/30/17 1213  BP: 127/75   Pulse: 78 86  Resp: 16 20  Temp: 36.7 C   SpO2: 100% 100%    Last Pain:  Vitals:   01/30/17 0540  TempSrc: Oral      Patients Stated Pain Goal: 4 (02/40/97 3532)  Complications: No apparent anesthesia complications

## 2017-01-30 NOTE — H&P (Signed)
Renal Mass  HPI: Jonathan Harvey is a 71 year-old male patient who is here further eval and management of a renal mass.  The mass is on the right side.   The lesion(s) was first noted on approximately 10/17/2016. The mass was seen on CT Scan.   He has had no symptoms. He has not seen blood in his urine. He does have a good appetite. He is not having pain in new locations. He has not recently had unwanted weight loss.   He has not had previous abdominal surgery. The patient can walk a flight of steps.   The patient denies history of diabetes, heart attack or stroke. There is not a a family history of kidney cancer. There is no family history of brain tumors (AMLs), seizures or brain aneurysm's.     ALLERGIES: None   MEDICATIONS: Metoprolol Succinate 25 mg tablet, extended release 24 hr  Aller-Tec D 5 mg-120 mg tablet, extended release 12 hr  Furosemide 40 mg tablet  Gabapentin 300 mg capsule  Losartan-Hydrochlorothiazide 100 mg-25 mg tablet  Low Dose Aspirin Ec 81 mg tablet, delayed release  Mature Adult Multivitamin  Potassium Chloride 20 meq packet  Tramadol Hcl 50 mg tablet  Vitamin B12  Vitamin D3 2,000 unit tablet     GU PSH: Radical Prostatectomy, 2008      PSH Notes: Remove lump from left arm (2006), vein therapy (2014 & 2016), Hip revision (left)- 04/2016, right knee replacement (08/2016)   NON-GU PSH: Anesth, Procedure On Femur, correction of curved femur (3536) Carpal Tunnel Surgery.., Right hand (2010) Hernia Repair, 2008 Hip Replacement, left (2001) Mastoidectomy, 1982 Right Hand, Thumb, stretch neck to relieve inflammation in lieu of surgery (1987)    GU PMH: None   NON-GU PMH: Arthritis Hypertension    FAMILY HISTORY: Colon Cancer - Brother leukemia - Grandmother nephrolithiasis - Runs in Family Spinal Cord Tumor - Grandmother stroke - Mother, Father   SOCIAL HISTORY: Marital Status: Married Preferred Language: English; Ethnicity: Not Hispanic Or Latino;  Race: White Current Smoking Status: Patient does not smoke anymore. Has not smoked since 11/02/1976. Smoked for 10 years.   Tobacco Use Assessment Completed: Used Tobacco in last 30 days? Does drink.  Drinks 4+ caffeinated drinks per day. Patient's occupation is/was Retired.    REVIEW OF SYSTEMS:    GU Review Male:   Patient reports hard to postpone urination, get up at night to urinate, and stream starts and stops. Patient denies frequent urination, burning/ pain with urination, leakage of urine, trouble starting your stream, have to strain to urinate , erection problems, and penile pain.  Gastrointestinal (Upper):   Patient denies nausea, vomiting, and indigestion/ heartburn.  Gastrointestinal (Lower):   Patient denies diarrhea and constipation.  Constitutional:   Patient denies fever, night sweats, weight loss, and fatigue.  Skin:   Patient denies skin rash/ lesion and itching.  Eyes:   Patient denies blurred vision and double vision.  Ears/ Nose/ Throat:   Patient denies sore throat and sinus problems.  Hematologic/Lymphatic:   Patient denies swollen glands and easy bruising.  Cardiovascular:   Patient denies leg swelling and chest pains.  Respiratory:   Patient denies cough and shortness of breath.  Endocrine:   Patient denies excessive thirst.  Musculoskeletal:   Patient reports back pain and joint pain.   Neurological:   Patient denies headaches and dizziness.  Psychologic:   Patient denies depression and anxiety.   VITAL SIGNS:      11/21/2016 02:55  PM  Weight 198 lb / 89.81 kg  Height 69 in / 175.26 cm  BP 134/75 mmHg  Pulse 103 /min  Temperature 98.1 F / 36.7 C  BMI 29.2 kg/m   MULTI-SYSTEM PHYSICAL EXAMINATION:    Constitutional: Well-nourished. No physical deformities. Normally developed. Good grooming.  Neck: Neck symmetrical, not swollen. Normal tracheal position.  Respiratory: No labored breathing, no use of accessory muscles. CTA-B  Cardiovascular: Normal  temperature, normal extremity pulses, no swelling, no varicosities. RRR  Lymphatic: No enlargement of neck, axillae, groin.  Skin: No paleness, no jaundice, no cyanosis. No lesion, no ulcer, no rash.  Neurologic / Psychiatric: Oriented to time, oriented to place, oriented to person. No depression, no anxiety, no agitation.  Gastrointestinal: No mass, no tenderness, no rigidity, non obese abdomen.  Eyes: Normal conjunctivae. Normal eyelids.  Ears, Nose, Mouth, and Throat: Left ear no scars, no lesions, no masses. Right ear no scars, no lesions, no masses. Nose no scars, no lesions, no masses. Normal hearing. Normal lips.  Musculoskeletal: Normal gait and station of head and neck.     PAST DATA REVIEWED:  Source Of History:  Patient  Lab Test Review:   BMP  Records Review:   Previous Patient Records, POC Tool  X-Ray Review: C.T. Abdomen/Pelvis: Reviewed Films.  C.T. Chest: Reviewed Films.     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 The patient has 2 main renal arteries on that side. The mass is exophytic but does come into and abuts the collecting system. We discussed partial laying this mass initially with the risk of removal of the entire kidney if partial nephrectomy is not a good option at the time of surgery.   I also reviewed the patient's chest CT. The patient has a 5 mm nodule in the right lung and a 4 and 3  mm nodule in the left lung. These are nonspecific. I explained to the patient that these may represent metastatic cancer, but often are incidental benign and common findings. We'll remove the tumor and repeat the CT scan in 3 months to see if these lesions have changed.   The patient has been given the natural history of renal cancer, treatment options, and recommended surgical extirpation for this patient. I went over the robotic-assisted laparoscopic partial nephrectomy approach. I described for the patient the procedure in detail including port placement. I detailed the postoperative course including the fact that the patient would have both a drain and a Foley catheter following the surgery. I told the patient that most often patients are discharged on postoperative day one or 2. I then detailed the expected recovery time, I told the patient that he would not be able to lift anything greater than 20 pounds for 4 weeks. I  also went over the risks and benefits of this operation in great detail. We discussed the risk of injury to surrounding structures, major blood vessels and nerves, bleeding, infection, loss of kidney, and the risk of recurrent cancer.   We will try to get this scheduled within the next few weeks.

## 2017-01-31 ENCOUNTER — Encounter (HOSPITAL_COMMUNITY): Payer: Self-pay | Admitting: Urology

## 2017-01-31 LAB — HEMOGLOBIN AND HEMATOCRIT, BLOOD
HEMATOCRIT: 26.8 % — AB (ref 39.0–52.0)
HEMATOCRIT: 26.7 % — AB (ref 39.0–52.0)
HEMOGLOBIN: 8.6 g/dL — AB (ref 13.0–17.0)
Hemoglobin: 8.8 g/dL — ABNORMAL LOW (ref 13.0–17.0)

## 2017-01-31 LAB — BASIC METABOLIC PANEL
ANION GAP: 8 (ref 5–15)
BUN: 15 mg/dL (ref 6–20)
CHLORIDE: 105 mmol/L (ref 101–111)
CO2: 27 mmol/L (ref 22–32)
CREATININE: 1.32 mg/dL — AB (ref 0.61–1.24)
Calcium: 8.6 mg/dL — ABNORMAL LOW (ref 8.9–10.3)
GFR calc non Af Amer: 53 mL/min — ABNORMAL LOW (ref >60)
Glucose, Bld: 137 mg/dL — ABNORMAL HIGH (ref 65–99)
POTASSIUM: 3.6 mmol/L (ref 3.5–5.1)
Sodium: 140 mmol/L (ref 135–145)

## 2017-01-31 LAB — CREATININE, FLUID (PLEURAL, PERITONEAL, JP DRAINAGE): Creat, Fluid: 1.4 mg/dL

## 2017-01-31 NOTE — Progress Notes (Signed)
Urology Inpatient Progress Report  RIGHT RENAL MASS  Procedure(s): ROBOTIC ASSITED PARTIAL NEPHRECTOMY  1 Day Post-Op   Intv/Subj: No acute events overnight. Patient is without complaint.  Patient's pain is well controlled.  His eye is less painful and his vision is clear. The patient is complaining of some right leg spasm.  Active Problems:   Renal mass  Current Facility-Administered Medications  Medication Dose Route Frequency Provider Last Rate Last Dose  . acetaminophen (OFIRMEV) IV 1,000 mg  1,000 mg Intravenous Q6H Debbrah Alar, PA-C 400 mL/hr at 01/31/17 0704 1,000 mg at 01/31/17 0704  . dextrose 5 %-0.45 % sodium chloride infusion   Intravenous Continuous Debbrah Alar, PA-C 75 mL/hr at 01/31/17 0442    . diphenhydrAMINE (BENADRYL) injection 12.5 mg  12.5 mg Intravenous Q6H PRN Dancy, Amanda, PA-C       Or  . diphenhydrAMINE (BENADRYL) 12.5 MG/5ML elixir 12.5 mg  12.5 mg Oral Q6H PRN Dancy, Amanda, PA-C      . gabapentin (NEURONTIN) capsule 300 mg  300 mg Oral BID WC Dancy, Amanda, PA-C      . gabapentin (NEURONTIN) capsule 300 mg  300 mg Oral QHS PRN Ardis Hughs, MD      . HYDROmorphone (DILAUDID) injection 0.5-1 mg  0.5-1 mg Intravenous Q2H PRN Dancy, Amanda, PA-C      . ketorolac (ACULAR) 0.5 % ophthalmic solution 1 drop  1 drop Left Eye Q8H PRN Myrtie Soman, MD   1 drop at 01/30/17 1612  . metoprolol succinate (TOPROL-XL) 24 hr tablet 25 mg  25 mg Oral Daily Dancy, Amanda, PA-C      . ondansetron (ZOFRAN) injection 4 mg  4 mg Intravenous Q4H PRN Dancy, Amanda, PA-C      . simvastatin (ZOCOR) tablet 20 mg  20 mg Oral QHS Dancy, Estill Bamberg, PA-C   20 mg at 01/30/17 2254  . traMADol (ULTRAM) tablet 100 mg  100 mg Oral Q6H PRN Ardis Hughs, MD      . trimethoprim-polymyxin b (POLYTRIM) ophthalmic solution 1 drop  1 drop Left Eye Q8H Myrtie Soman, MD   1 drop at 01/31/17 0121     Objective: Vital: Vitals:   01/30/17 1415 01/30/17 1433 01/30/17 1531 01/31/17  0510  BP: 136/73   129/62  Pulse: 86 89  88  Resp: 15 16  18   Temp:  97.7 F (36.5 C)  99 F (37.2 C)  TempSrc:    Oral  SpO2: 99% 100%  97%  Weight:   82.5 kg (181 lb 14.1 oz)   Height:       I/Os: I/O last 3 completed shifts: In: 2000 [I.V.:1900; IV Piggyback:100] Out: 2390 [Urine:1910; Drains:280; Blood:200]  Physical Exam:  General: Patient is in no apparent distress Lungs: Normal respiratory effort, chest expands symmetrically. GI: Incisions are c/d/i. The abdomen is soft and appropriately tender without mass. JP drain with serosanguinous drainage Foley: Clear yellow Ext: lower extremities symmetric  Lab Results: Recent Labs    01/28/17 1337 01/30/17 1256 01/31/17 0436  WBC 4.9  --   --   HGB 10.3* 9.8* 8.8*  HCT 31.9* 30.0* 26.8*   Recent Labs    01/28/17 1337 01/31/17 0436  NA 139 140  K 3.6 3.6  CL 101 105  CO2 30 27  GLUCOSE 100* 137*  BUN 15 15  CREATININE 1.18 1.32*  CALCIUM 9.6 8.6*   No results for input(s): LABPT, INR in the last 72 hours. No results for input(s): LABURIN in  the last 72 hours. Results for orders placed or performed during the hospital encounter of 01/28/17  Urine culture     Status: None   Collection Time: 01/28/17  1:37 PM  Result Value Ref Range Status   Specimen Description URINE, RANDOM  Final   Special Requests NONE  Final   Culture   Final    NO GROWTH Performed at [facility], [address] [place], [place] [address]    Report Status 01/29/2017 FINAL  Final    Studies/Results: No results found.  Assessment: Procedure(s): ROBOTIC ASSITED PARTIAL NEPHRECTOMY, 1 Day Post-Op  doing well.  Plan: The plan is to remove the patient's Foley catheter, allow him to move around ad lib. we will saline lock his IV.  Send JP for creatinine.  Recheck his hemoglobin at 1030.  Reevaluate him this afternoon for possible discharge.   Louis Meckel, MD Urology 01/31/2017, 7:34 AM

## 2017-01-31 NOTE — Progress Notes (Addendum)
Pt continues to C/O left eye discomfort and blurred vision with glasses off and with glasses on pt continue to complain of blurred vision and states vision is not at baseline, MD aware, pt states left eye discomfort occurred in PACU. Eye drops administered as ordered. SRP, RN

## 2017-01-31 NOTE — Care Management Note (Signed)
Case Management Note  Patient Details  Name: Jonathan Harvey MRN: [id number] Date of Birth: Jan 29, 1946  Subjective/Objective:        Pt admitted with Renal Mass            Action/Plan: Plan to discharge home   Expected Discharge Date:                  Expected Discharge Plan:  Home/Self Care  [name]:     Discharge planning Services  CM Consult  Post Acute Care Choice:    Choice offered to:  NA  DME Arranged:    DME Agency:     HH Arranged:    HH Agency:     Status of Service:  In process, will continue to follow  If discussed at Long Length of Stay Meetings, dates discussed:    Additional CommentsPurcell Mouton, RN 01/31/2017, 4:14 PM

## 2017-02-01 DIAGNOSIS — D62 Acute posthemorrhagic anemia: Secondary | ICD-10-CM | POA: Diagnosis not present

## 2017-02-01 DIAGNOSIS — C641 Malignant neoplasm of right kidney, except renal pelvis: Secondary | ICD-10-CM | POA: Diagnosis present

## 2017-02-01 LAB — CBC
HCT: 25.8 % — ABNORMAL LOW (ref 39.0–52.0)
Hemoglobin: 8.2 g/dL — ABNORMAL LOW (ref 13.0–17.0)
MCH: 29.4 pg (ref 26.0–34.0)
MCHC: 31.8 g/dL (ref 30.0–36.0)
MCV: 92.5 fL (ref 78.0–100.0)
Platelets: 279 10*3/uL (ref 150–400)
RBC: 2.79 MIL/uL — ABNORMAL LOW (ref 4.22–5.81)
RDW: 15.6 % — ABNORMAL HIGH (ref 11.5–15.5)
WBC: 8.5 10*3/uL (ref 4.0–10.5)

## 2017-02-01 LAB — BASIC METABOLIC PANEL
Anion gap: 6 (ref 5–15)
BUN: 13 mg/dL (ref 6–20)
CO2: 29 mmol/L (ref 22–32)
Calcium: 8.4 mg/dL — ABNORMAL LOW (ref 8.9–10.3)
Chloride: 103 mmol/L (ref 101–111)
Creatinine, Ser: 1.26 mg/dL — ABNORMAL HIGH (ref 0.61–1.24)
GFR calc Af Amer: >60 mL/min (ref >60)
GFR calc non Af Amer: 57 mL/min — ABNORMAL LOW (ref >60)
Glucose, Bld: 113 mg/dL — ABNORMAL HIGH (ref 65–99)
Potassium: 3.6 mmol/L (ref 3.5–5.1)
Sodium: 138 mmol/L (ref 135–145)

## 2017-02-01 MED ORDER — FERROUS SULFATE 325 (65 FE) MG PO TABS: 325.0000 mg | ORAL_TABLET | Freq: Two times a day (BID) | ORAL | 1 refills | Status: DC

## 2017-02-01 NOTE — Progress Notes (Signed)
Pt given discharge  instructions including medications and schedule of medications. Pt verbalized understanding of all discharge instructions including new mediations and changes to medications. Discharge packet with prescription given to Pt.

## 2017-02-01 NOTE — Discharge Instructions (Signed)
1.  Activity:  You are encouraged to ambulate frequently (about every hour during waking hours) to help prevent blood clots from forming in your legs or lungs.  However, you should not engage in any heavy lifting (> 10-15 lbs), strenuous activity, or straining. 2. Diet: You should advance your diet as instructed by your physician.  It will be normal to have some bloating, nausea, and abdominal discomfort intermittently. 3. Prescriptions:  You will be provided a prescription for pain medication to take as needed.  If your pain is not severe enough to require the prescription pain medication, you may take extra strength Tylenol instead which will have less side effects.  You should also take a prescribed stool softener to avoid straining with bowel movements as the prescription pain medication may constipate you. 4. Incisions: You may remove your dressing bandages 48 hours after surgery if not removed in the hospital.  You will either have some small staples or special tissue glue at each of the incision sites. Once the bandages are removed (if present), the incisions may stay open to air.  You may start showering (but not soaking or bathing in water) the 2nd day after surgery and the incisions simply need to be patted dry after the shower.  No additional care is needed. 5. What to call us about: You should call the office [phone]) if you develop fever > 101 or develop persistent vomiting.  You may resume aspirin, advil, aleve, vitamins, and supplements 7 days after surgery.  I have added an iron supplement to help boost your blood count.   It will turn the stool black.

## 2017-02-01 NOTE — Discharge Summary (Signed)
Physician Discharge Summary  Patient ID: Jonathan Harvey MRN: [id number] DOB/AGE: 06-17-1945 71 y.o.  Admit date: 01/30/2017 Discharge date: 02/01/2017  Admission Diagnoses:  Renal cell carcinoma of right kidney [facility])  Discharge Diagnoses:  Principal Problem:   Renal cell carcinoma of right kidney ([place]) Active Problems:   Renal mass   Postoperative anemia due to acute blood loss   Past Medical History:  Diagnosis Date  . Arthritis    "all over" (12/25/2016)  . Chronic lower back pain    "severe" (12/25/2016)  . Hepatitis B 2008  . History of kidney stones   . Hypertension   . Hypertriglyceridemia   . Prostate cancer ([place]) 10/2006   s/p robotic prostatectomy   . Renal cell carcinoma, right ([place])   . Spinal stenosis     Surgeries: Procedure(s): ROBOTIC ASSITED PARTIAL NEPHRECTOMY on 01/30/2017   Consultants (if any):   Discharged Condition: Improved  Hospital Course: RENOLD KOZAR is an 71 y.o. male who was admitted 01/30/2017 with a diagnosis of Renal cell carcinoma of right kidney ([place]) and went to the operating room on 01/30/2017 and underwent the above named procedures.  He has done well post op but has an asymptomatic post op blood loss anemia.   He is ready for discharge today.   .BP (!) 128/47 (BP Location: Right Arm)   Pulse 90   Temp 98.9 F (37.2 C) (Oral)   Resp 18   Ht 5\' 8"  (1.727 m)   Wt 82.5 kg (181 lb 14.1 oz)   SpO2 98%   BMI 27.65 kg/m    Gen: WD, WN in NAD. CV: RRR without murmur. Lungs: CTA with normal effort. GI: Soft, slightly distended with mild tenderness.   All wounds intact without erythema. Ext: -edema or tenderness.   He was given perioperative antibiotics:  Anti-infectives (From admission, onward)   Start     Dose/Rate Route Frequency Ordered Stop   01/30/17 1149  ceFAZolin (ANCEF) 2-4 GM/100ML-% IVPB    Comments:  Bridget Hartshorn   : cabinet override      01/30/17 1149 01/30/17 2359   01/30/17 0653  ceFAZolin (ANCEF) 2-4  GM/100ML-% IVPB    Comments:  Chrystine Oiler: cabinet override      01/30/17 0653 01/30/17 1859    .  He was given sequential compression devices and early ambulation for DVT prophylaxis.  He benefited maximally from the hospital stay and there were no complications.    Recent vital signs:  Vitals:   01/31/17 1700 01/31/17 2124  BP: (!) 122/54 (!) 128/47  Pulse: 95 90  Resp: 18 18  Temp: 98.2 F (36.8 C) 98.9 F (37.2 C)  SpO2: 97% 98%    Recent laboratory studies:  Lab Results  Component Value Date   HGB 8.2 (L) 02/01/2017   HGB 8.6 (L) 01/31/2017   HGB 8.8 (L) 01/31/2017   Lab Results  Component Value Date   WBC 8.5 02/01/2017   PLT 279 02/01/2017   Lab Results  Component Value Date   INR 1.01 12/25/2016   Lab Results  Component Value Date   NA 138 02/01/2017   K 3.6 02/01/2017   CL 103 02/01/2017   CO2 29 02/01/2017   BUN 13 02/01/2017   CREATININE 1.26 (H) 02/01/2017   GLUCOSE 113 (H) 02/01/2017    Discharge Medications:   Allergies as of 02/01/2017      Reactions   Lactose Intolerance (gi) Other (See Comments)   Mild GI upset  Medication List    STOP taking these medications   aspirin EC 81 MG tablet   multivitamin with minerals Tabs tablet   Vitamin B-12 5000 MCG Subl   Vitamin D 2000 units tablet     TAKE these medications   cetirizine-pseudoephedrine 5-120 MG tablet Commonly known as:  ZYRTEC-D Take 1 tablet by mouth at bedtime.   ferrous sulfate 325 (65 FE) MG tablet Take 1 tablet (325 mg total) by mouth 2 (two) times daily with a meal.   furosemide 40 MG tablet Commonly known as:  LASIX Take 20 mg by mouth daily.   gabapentin 300 MG capsule Commonly known as:  NEURONTIN Take 300 mg See admin instructions by mouth. Morning, Lunch,  (may take dose if needed at bedtime)   guaiFENesin 600 MG 12 hr tablet Commonly known as:  MUCINEX Take 600 mg 2 (two) times daily as needed by mouth for cough.   hydrocortisone 2.5 %  rectal cream Commonly known as:  ANUSOL-HC Place 1 application rectally daily. Applied after bathing due to hemorrhoids   losartan-hydrochlorothiazide 100-25 MG tablet Commonly known as:  HYZAAR Take 1 tablet by mouth daily.   metoprolol succinate 25 MG 24 hr tablet Commonly known as:  TOPROL-XL Take 25 mg by mouth daily.   potassium chloride SA 20 MEQ tablet Commonly known as:  K-DUR,KLOR-CON Take 20 mEq daily by mouth.   simvastatin 40 MG tablet Commonly known as:  ZOCOR Take 20 mg by mouth at bedtime.   traMADol 50 MG tablet Commonly known as:  ULTRAM Take 1-2 tablets (50-100 mg total) by mouth every 6 (six) hours as needed for moderate pain or severe pain (for pain). What changed:    how much to take  when to take this  reasons to take this       Diagnostic Studies: No results found.  Disposition: 06-Home-Health Care Svc    Follow-up Information    Ardis Hughs, MD Follow up on 02/13/2017.   Specialty:  Urology Why:  at 1:45 Contact information: [place] [address] [phone]            Signed: Irine Seal 02/01/2017, 7:42 AM

## 2017-03-10 ENCOUNTER — Ambulatory Visit: Payer: Medicare Other | Admitting: Physical Therapy

## 2017-03-13 ENCOUNTER — Ambulatory Visit: Payer: Medicare Other | Attending: Orthopedic Surgery | Admitting: Physical Therapy

## 2017-03-13 ENCOUNTER — Encounter: Payer: Medicare Other | Admitting: Physical Therapy

## 2017-03-13 ENCOUNTER — Encounter: Payer: Self-pay | Admitting: Physical Therapy

## 2017-03-13 DIAGNOSIS — M6281 Muscle weakness (generalized): Secondary | ICD-10-CM | POA: Insufficient documentation

## 2017-03-13 DIAGNOSIS — M25552 Pain in left hip: Secondary | ICD-10-CM | POA: Insufficient documentation

## 2017-03-13 NOTE — Therapy (Signed)
[facility] [place], [place], [address] Phone: [phone]   Fax:  [phone]  Physical Therapy Evaluation  Patient Details  Name: Jonathan Harvey MRN: [id number] Date of Birth: 05/11/1945 Referring Provider: [name] MD.   Encounter Date: 03/13/2017  PT End of Session - 03/13/17 1222    Visit Number  1    Number of Visits  16    Date for PT Re-Evaluation  05/12/17    Authorization Type  FOTO.Marland Kitchenat least every 5th visit.    PT Start Time  1033    PT Stop Time  1121    PT Time Calculation (min)  48 min    Activity Tolerance  Patient tolerated treatment well    Behavior During Therapy  WFL for tasks assessed/performed       Past Medical History:  Diagnosis Date  . Arthritis    "all over" (12/25/2016)  . Chronic lower back pain    "severe" (12/25/2016)  . Hepatitis B 2008  . History of kidney stones   . Hypertension   . Hypertriglyceridemia   . Prostate cancer ([place]) 10/2006   s/p robotic prostatectomy   . Renal cell carcinoma, right ([place])   . Spinal stenosis     Past Surgical History:  Procedure Laterality Date  . CARPAL TUNNEL RELEASE Right 2010  . CYST EXCISION Left    elbow  . FEMUR SURGERY Left ~ 1962   "femur was growing crooked; broke it to straighten it out; body cast for ~ 4 months"  . FINGER SURGERY Right 1987   right thumb and index finger (pt denies this hx on 12/25/2016)  . FRACTURE SURGERY    . INGUINAL HERNIA REPAIR Left 2008  . JOINT REPLACEMENT    . MASTOIDECTOMY  1982  . ORIF GREATER TROCHANTERIC FRACTURE Left 12/25/2016    ACETABULAR REVISION/notes 12/25/2016  . PROSTATECTOMY  10/2006  . ROBOTIC ASSITED PARTIAL NEPHRECTOMY Right 01/30/2017   Procedure: ROBOTIC ASSITED PARTIAL NEPHRECTOMY;  Surgeon: Ardis Hughs, MD;  Location: [facility];  Service: Urology;  Laterality: Right;  . TOTAL HIP ARTHROPLASTY Left 2002  . TOTAL HIP REVISION Left 04/24/2016   Procedure: TOTAL HIP REVISION;   Surgeon: [name], MD;  Location: [place];  Service: Orthopedics;  Laterality: Left;  . TOTAL HIP REVISION Left 12/25/2016   Procedure: OPEN REDUCTION INTERNAL FIXATION GREATER TROCHANTERIC FRACTURE, ACETABULAR REVISION;  Surgeon: [name], MD;  Location: [place];  Service: Orthopedics;  Laterality: Left;  . TOTAL KNEE ARTHROPLASTY Right 08/26/2016   Procedure: RIGHT TOTAL KNEE ARTHROPLASTY;  Surgeon: [name], MD;  Location: [place];  Service: Orthopedics;  Laterality: Right;    There were no vitals filed for this visit.   Subjective Assessment - 03/13/17 1102    Subjective  The patient presents with c/o left hip pain.  He has had a left TKA then a revision and ORIF from a fall performed on 12/25/16.  He reports a pain-level of 8/10 today increasing pain with walking and "any physical activity."  Sitting and lying down can decrease his pain a bit.      Pertinent History  3 left hip surgeries; right TKA.    How long can you stand comfortably?  10 minutes.    How long can you walk comfortably?  Short distances in community.    Patient Stated Goals  Walk and get around with no pain.    Currently in Pain?  Yes    Pain Score  8     Pain Location  Hip    Pain Orientation  Left    Pain Descriptors / Indicators  Aching    Pain Type  Chronic pain    Pain Onset  More than a month ago    Pain Frequency  Constant    Aggravating Factors   See above.    Pain Relieving Factors  See above.         [facility] PT Assessment - 03/13/17 0001      Assessment   Medical Diagnosis  Left hip replacement.    Referring Provider  [name] MD.    Onset Date/Surgical Date  -- 12/25/16.      Precautions   Precaution Comments  Patient remains mindful of hip precautions.      Restrictions   Weight Bearing Restrictions  No      Balance Screen   Has the patient fallen in the past 6 months  Yes    How many times?  -- 3.    Has the patient had a decrease in activity level because of a fear of falling?    No    Is the patient reluctant to leave their home because of a fear of falling?   No      Home Environment   Living Environment  Private residence      Prior Function   Level of Independence  Independent      Observation/Other Assessments   Observations  Left hip incisional site looks good and well healed.    Focus on Therapeutic Outcomes (FOTO)   65% limitation.      Posture/Postural Control   Posture Comments  The patient stands in some spinal flexion.  Right knee in 5 degrees of flexion.      ROM / Strength   AROM / PROM / Strength  AROM;Strength      AROM   Overall AROM Comments  Right knee extension -5 degrees from full extension.  Left hip flexion to 80 degrees in supine with no increase in pain. Patient mindful left hip precautions.      Strength   Overall Strength Comments  Left hip flexion= 4-/5; abduction= 2-/5; left knee and ankle is 5/5.      Palpation   Palpation comment  Patient tender to palpation over and around his left greater trochanter.      Special Tests   Other special tests  Left LE ~1 inch shorter than right.      Bed Mobility   Bed Mobility  -- Independent.      Transfers   Transfers  -- Independent.      Ambulation/Gait   Gait Comments  Trendelenburg gait pattern with patient using a straight cane.             Objective measurements completed on examination: See above findings.      [facility] Adult PT Treatment/Exercise - 03/13/17 0001      Modalities   Modalities  Electrical Stimulation;Moist Heat      Moist Heat Therapy   Number Minutes Moist Heat  15 Minutes    Moist Heat Location  -- Left hip.      Acupuncturist Location  -- Left lateral hip.    Electrical Stimulation Action  -- Pre-mod.    Electrical Stimulation Parameters  -- Constant at 80-150 Hz x 15 minutes.    Electrical Stimulation Goals  Pain  PT Short Term Goals - 03/13/17 1231      PT SHORT TERM GOAL #1    Title  Independent with an initial HEP.    Time  2    Period  Weeks    Status  New        PT Long Term Goals - 03/13/17 1231      PT LONG TERM GOAL #1   Title  Independent with an advanced HEP.    Time  8    Period  Weeks    Status  New      PT LONG TERM GOAL #2   Title  Right hip abduction to a solid 4/5 to increase stability for walking and functional actiivtes.    Time  8    Period  Weeks    Status  New      PT LONG TERM GOAL #3   Title  Perform ADL's with pain not > 3/10.    Time  8    Period  Weeks    Status  New      PT LONG TERM GOAL #4   Title  Perform a reciprocating stair gait with one railing with pain not > 2-3/10.    Time  8    Period  Weeks    Status  New      PT LONG TERM GOAL #5   Title  Walk a community distance with pain not > 3/10.    Time  8    Period  Weeks    Status  New             Plan - 03/13/17 1213    Clinical Impression Statement  The patient presents to [facility] following a left hip ORIF.  Prior to this he has has a THA and revision.  He has a significnat leg length discrepancy, left shorter than right due to an injury many years ago.  He has a Trendelenburg gait pattern using a straight cane.  He is very weak into left hip abduction.  He is very midful of his hip precautions.  His pain and deficits impair his functional mobility.  Patient will benefit from skilled physical therapy intervention.    History and Personal Factors relevant to plan of care:  3 left hip surgeries.  Right TKA.  Chronic low back pain.    Clinical Presentation  Evolving    Clinical Presentation due to:  Not improving significantly.  Chronicity.    Clinical Decision Making  Moderate    Rehab Potential  Good    PT Frequency  2x / week    PT Duration  8 weeks    PT Treatment/Interventions  ADLs/Self Care Home Management;Cryotherapy;Electrical Stimulation;Gait training;Stair training;Functional mobility training;Therapeutic activities;Therapeutic  exercise;Neuromuscular re-education;Patient/family education;Manual techniques    PT Next Visit Plan  Need to work on on left hip abduction strengthening.  Progress to sdly hip abduction with assistance.  Left hip precautions.  Electrical stimulation and STW/M to left lateral hip.  Parallel bar activites.      Consulted and Agree with Plan of Care  Patient       Patient will benefit from skilled therapeutic intervention in order to improve the following deficits and impairments:  Abnormal gait, Decreased activity tolerance, Decreased range of motion, Decreased strength, Pain  Visit Diagnosis: Pain in left hip - Plan: PT plan of care cert/re-cert  Muscle weakness (generalized) - Plan: PT plan of care cert/re-cert     Problem List Patient Active  Problem List   Diagnosis Date Noted  . Renal cell carcinoma of right kidney ([place]) 02/01/2017  . Postoperative anemia due to acute blood loss 02/01/2017  . Renal mass 01/30/2017  . 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 Primary osteoarthritis of left hip 12/25/2016  . Primary localized osteoarthritis of right knee 08/26/2016  . Primary osteoarthritis of right knee 08/25/2016  . Status post revision of total hip 04/24/2016  . ED (erectile dysfunction) of organic origin 07/01/2012  . Malignant neoplasm of prostate ([place]) 07/01/2012  . Microscopic hematuria 07/01/2012  . Cervical stenosis of spinal canal 01/10/2012    Celesta Funderburk, [place] MPT 03/13/2017, 12:37 PM  [facility] [place], [place], [address] Phone: [phone]   Fax:  [phone]  Name: Jonathan Harvey MRN: [id number] Date of Birth: 1945-04-26

## 2017-03-17 ENCOUNTER — Encounter: Payer: Self-pay | Admitting: Physical Therapy

## 2017-03-17 ENCOUNTER — Ambulatory Visit: Payer: Medicare Other | Admitting: Physical Therapy

## 2017-03-17 DIAGNOSIS — M6281 Muscle weakness (generalized): Secondary | ICD-10-CM

## 2017-03-17 DIAGNOSIS — M25552 Pain in left hip: Secondary | ICD-10-CM | POA: Diagnosis not present

## 2017-03-17 NOTE — Therapy (Signed)
[facility] [place], [place], [address] Phone: [phone]   Fax:  [phone]  Physical Therapy Treatment  Patient Details  Name: Jonathan Harvey MRN: [id number] Date of Birth: [birth year] Referring Provider: [name] MD.   Encounter Date: 03/17/2017  PT End of Session - 03/17/17 1039    Visit Number  2    Number of Visits  16    Date for PT Re-Evaluation  05/12/17    Authorization Type  FOTO.Marland Kitchenat least every 5th visit.    PT Start Time  1037    PT Stop Time  1130    PT Time Calculation (min)  53 min    Activity Tolerance  Patient tolerated treatment well    Behavior During Therapy  WFL for tasks assessed/performed       Past Medical History:  Diagnosis Date  . Arthritis    "all over" (12/25/2016)  . Chronic lower back pain    "severe" (12/25/2016)  . Hepatitis B 2008  . History of kidney stones   . Hypertension   . Hypertriglyceridemia   . Prostate cancer ([place]) 10/2006   s/p robotic prostatectomy   . Renal cell carcinoma, right ([place])   . Spinal stenosis     Past Surgical History:  Procedure Laterality Date  . CARPAL TUNNEL RELEASE Right 2010  . CYST EXCISION Left    elbow  . FEMUR SURGERY Left ~ 1962   "femur was growing crooked; broke it to straighten it out; body cast for ~ 4 months"  . FINGER SURGERY Right 1987   right thumb and index finger (pt denies this hx on 12/25/2016)  . FRACTURE SURGERY    . INGUINAL HERNIA REPAIR Left 2008  . JOINT REPLACEMENT    . MASTOIDECTOMY  1982  . ORIF GREATER TROCHANTERIC FRACTURE Left 12/25/2016    ACETABULAR REVISION/notes 12/25/2016  . PROSTATECTOMY  10/2006  . ROBOTIC ASSITED PARTIAL NEPHRECTOMY Right 01/30/2017   Procedure: ROBOTIC ASSITED PARTIAL NEPHRECTOMY;  Surgeon: Ardis Hughs, MD;  [facility];  Service: Urology;  Laterality: Right;  . TOTAL HIP ARTHROPLASTY Left 2002  . TOTAL HIP REVISION Left 04/24/2016   Procedure: TOTAL HIP REVISION;   Surgeon: [name], MD;  Location: [place];  Service: Orthopedics;  Laterality: Left;  . TOTAL HIP REVISION Left 12/25/2016   Procedure: OPEN REDUCTION INTERNAL FIXATION GREATER TROCHANTERIC FRACTURE, ACETABULAR REVISION;  Surgeon: [name], MD;  Location: [place];  Service: Orthopedics;  Laterality: Left;  . TOTAL KNEE ARTHROPLASTY Right 08/26/2016   Procedure: RIGHT TOTAL KNEE ARTHROPLASTY;  Surgeon: [name], MD;  Location: [place];  Service: Orthopedics;  Laterality: Right;    There were no vitals filed for this visit.  Subjective Assessment - 03/17/17 1036    Subjective  Reports that his hip is okay but woke up with back pain this morning. Reports that he is getting an epidural for LBP.    Pertinent History  3 left hip surgeries; right TKA.    How long can you stand comfortably?  10 minutes.    How long can you walk comfortably?  Short distances in community.    Patient Stated Goals  Walk and get around with no pain.    Currently in Pain?  Yes    Pain Score  7     Pain Location  Hip    Pain Orientation  Left    Pain Descriptors / Indicators  Discomfort    Pain Type  Chronic  pain    Pain Onset  More than a month ago    Pain Frequency  Constant         [facility] PT Assessment - 03/17/17 0001      Assessment   Medical Diagnosis  Left hip replacement.    Onset Date/Surgical Date  12/25/16    Next MD Visit  03/18/2017      Precautions   Precaution Comments  Patient remains mindful of hip precautions.      Restrictions   Weight Bearing Restrictions  No                  [facility] Adult PT Treatment/Exercise - 03/17/17 0001      Exercises   Exercises  Knee/Hip      Knee/Hip Exercises: Aerobic   Nustep  L4-6 x15 min      Knee/Hip Exercises: Standing   Hip Flexion  AROM;Left;20 reps;Knee bent    Hip Abduction  AROM;Left;20 reps;Knee straight    Hip Extension  AROM;Left;20 reps;Knee straight    Forward Step Up  Left;20 reps;Hand Hold: 2;Step Height: 6"       Knee/Hip Exercises: Supine   Bridges  Strengthening;20 reps    Straight Leg Raises  AROM;Left;20 reps      Modalities   Modalities  Teacher, English as a foreign language Location  L hip    Electrical Stimulation Action  Pre-Mod    Electrical Stimulation Parameters  80-150 hz x15 min    Electrical Stimulation Goals  Pain               PT Short Term Goals - 03/13/17 1231      PT SHORT TERM GOAL #1   Title  Independent with an initial HEP.    Time  2    Period  Weeks    Status  New        PT Long Term Goals - 03/13/17 1231      PT LONG TERM GOAL #1   Title  Independent with an advanced HEP.    Time  8    Period  Weeks    Status  New      PT LONG TERM GOAL #2   Title  Right hip abduction to a solid 4/5 to increase stability for walking and functional actiivtes.    Time  8    Period  Weeks    Status  New      PT LONG TERM GOAL #3   Title  Perform ADL's with pain not > 3/10.    Time  8    Period  Weeks    Status  New      PT LONG TERM GOAL #4   Title  Perform a reciprocating stair gait with one railing with pain not > 2-3/10.    Time  8    Period  Weeks    Status  New      PT LONG TERM GOAL #5   Title  Walk a community distance with pain not > 3/10.    Time  8    Period  Weeks    Status  New            Plan - 03/17/17 1128    Clinical Impression Statement  Patient presented in clinic with L hip pain which he also correlated to flare up of LBP. Patient still ambulating with furniture touch assistance and trendelenberg gait due to  leg length discrepancy. Patient experienced greater difficulty with hip extension in standing. Hip/pelvic instability noted with bridges with VCs for correction. Normal modalities response noted following removal of the modalities.    Rehab Potential  Good    PT Frequency  2x / week    PT Duration  8 weeks    PT Treatment/Interventions  ADLs/Self Care Home  Management;Cryotherapy;Electrical Stimulation;Gait training;Stair training;Functional mobility training;Therapeutic activities;Therapeutic exercise;Neuromuscular re-education;Patient/family education;Manual techniques    PT Next Visit Plan  Need to work on on left hip abduction strengthening.  Progress to sdly hip abduction with assistance.  Left hip precautions.  Electrical stimulation and STW/M to left lateral hip.  Parallel bar activites.      Consulted and Agree with Plan of Care  Patient       Patient will benefit from skilled therapeutic intervention in order to improve the following deficits and impairments:  Abnormal gait, Decreased activity tolerance, Decreased range of motion, Decreased strength, Pain  Visit Diagnosis: Pain in left hip  Muscle weakness (generalized)     Problem List Patient Active Problem List   Diagnosis Date Noted  . Renal cell carcinoma of right kidney ([place]) 02/01/2017  . Postoperative anemia due to acute blood loss 02/01/2017  . Renal mass 01/30/2017  . 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 Primary osteoarthritis of left hip 12/25/2016  . Primary localized osteoarthritis of right knee 08/26/2016  . Primary osteoarthritis of right knee 08/25/2016  . Status post revision of total hip 04/24/2016  . ED (erectile dysfunction) of organic origin 07/01/2012  . Malignant neoplasm of prostate ([place]) 07/01/2012  . Microscopic hematuria 07/01/2012  . Cervical stenosis of spinal canal 01/10/2012    Standley Brooking, PTA 03/17/2017, 11:54 AM  [facility] [place], [place], [address] Phone: [phone]   Fax:  [phone]  Name: Jonathan Harvey MRN: [id number] Date of Birth: 1946-02-24

## 2017-03-20 ENCOUNTER — Ambulatory Visit: Payer: Medicare Other | Admitting: Physical Therapy

## 2017-03-20 DIAGNOSIS — M25552 Pain in left hip: Secondary | ICD-10-CM | POA: Diagnosis not present

## 2017-03-20 DIAGNOSIS — M6281 Muscle weakness (generalized): Secondary | ICD-10-CM

## 2017-03-20 NOTE — Therapy (Signed)
[facility] [place], [place], [address] Phone: [phone]   Fax:  [phone]  Physical Therapy Treatment  Patient Details  Name: Jonathan Harvey MRN: [id number] Date of Birth: 1945-05-31 Referring Provider: [name] MD.   Encounter Date: 03/20/2017  PT End of Session - 03/20/17 1047    Visit Number  3    Number of Visits  16    Date for PT Re-Evaluation  05/12/17    Authorization Type  FOTO.Marland Kitchenat least every 5th visit.    PT Start Time  1032    PT Stop Time  1134    PT Time Calculation (min)  62 min       Past Medical History:  Diagnosis Date  . Arthritis    "all over" (12/25/2016)  . Chronic lower back pain    "severe" (12/25/2016)  . Hepatitis B 2008  . History of kidney stones   . Hypertension   . Hypertriglyceridemia   . Prostate cancer ([place]) 10/2006   s/p robotic prostatectomy   . Renal cell carcinoma, right ([place])   . Spinal stenosis     Past Surgical History:  Procedure Laterality Date  . CARPAL TUNNEL RELEASE Right 2010  . CYST EXCISION Left    elbow  . FEMUR SURGERY Left ~ 1962   "femur was growing crooked; broke it to straighten it out; body cast for ~ 4 months"  . FINGER SURGERY Right 1987   right thumb and index finger (pt denies this hx on 12/25/2016)  . FRACTURE SURGERY    . INGUINAL HERNIA REPAIR Left 2008  . JOINT REPLACEMENT    . MASTOIDECTOMY  1982  . ORIF GREATER TROCHANTERIC FRACTURE Left 12/25/2016    ACETABULAR REVISION/notes 12/25/2016  . PROSTATECTOMY  10/2006  . ROBOTIC ASSITED PARTIAL NEPHRECTOMY Right 01/30/2017   Procedure: ROBOTIC ASSITED PARTIAL NEPHRECTOMY;  Surgeon: Ardis Hughs, MD;  Location: [facility];  Service: Urology;  Laterality: Right;  . TOTAL HIP ARTHROPLASTY Left 2002  . TOTAL HIP REVISION Left 04/24/2016   Procedure: TOTAL HIP REVISION;  Surgeon: [name], MD;  Location: [place];  Service: Orthopedics;  Laterality: Left;  . TOTAL HIP REVISION Left  12/25/2016   Procedure: OPEN REDUCTION INTERNAL FIXATION GREATER TROCHANTERIC FRACTURE, ACETABULAR REVISION;  Surgeon: [name], MD;  Location: [place];  Service: Orthopedics;  Laterality: Left;  . TOTAL KNEE ARTHROPLASTY Right 08/26/2016   Procedure: RIGHT TOTAL KNEE ARTHROPLASTY;  Surgeon: [name], MD;  Location: [place];  Service: Orthopedics;  Laterality: Right;    There were no vitals filed for this visit.  Subjective Assessment - 03/20/17 1109    Subjective  Patient reported hip is "okay". He reported the epidural helped his back pain but felt he "over did it"  yesterday and feels sore today.    Pertinent History  3 left hip surgeries; right TKA.    How long can you stand comfortably?  10 minutes.    How long can you walk comfortably?  Short distances in community.    Patient Stated Goals  Walk and get around with no pain.    Pain Score  7     Pain Location  Hip    Pain Orientation  Left    Pain Descriptors / Indicators  Aching;Sore    Pain Type  Chronic pain                      [facility] Adult PT Treatment/Exercise -  03/20/17 0001      Knee/Hip Exercises: Aerobic   Nustep  L6, seat 11 x15 min      Knee/Hip Exercises: Standing   Hip Flexion  Stengthening;Left;3 sets;10 reps;Knee bent    Hip Abduction  Stengthening;Left;Knee straight;3 sets;10 reps    Hip Extension  Stengthening;3 sets;10 reps    Other Standing Knee Exercises  Eccentric lateral step downs, 4" step, x15, 2 hand hold      Knee/Hip Exercises: Seated   Hamstring Curl  Strengthening;Left;2 sets;10 reps    Hamstring Limitations  Yellow theraband      Knee/Hip Exercises: Supine   Bridges  Strengthening;20 reps    Straight Leg Raises  Strengthening;20 reps;Left    Other Supine Knee/Hip Exercises  Supine hip abduction, knees bent, yellow theraband, 2x10      Modalities   Modalities  Electrical Stimulation      Electrical Stimulation   Electrical Stimulation Location  Left Hip    Electrical  Stimulation Action  Pre-mod    Electrical Stimulation Parameters  80-150 hz x15 minutes     Electrical Stimulation Goals  Pain               PT Short Term Goals - 03/13/17 1231      PT SHORT TERM GOAL #1   Title  Independent with an initial HEP.    Time  2    Period  Weeks    Status  New        PT Long Term Goals - 03/13/17 1231      PT LONG TERM GOAL #1   Title  Independent with an advanced HEP.    Time  8    Period  Weeks    Status  New      PT LONG TERM GOAL #2   Title  Right hip abduction to a solid 4/5 to increase stability for walking and functional actiivtes.    Time  8    Period  Weeks    Status  New      PT LONG TERM GOAL #3   Title  Perform ADL's with pain not > 3/10.    Time  8    Period  Weeks    Status  New      PT LONG TERM GOAL #4   Title  Perform a reciprocating stair gait with one railing with pain not > 2-3/10.    Time  8    Period  Weeks    Status  New      PT LONG TERM GOAL #5   Title  Walk a community distance with pain not > 3/10.    Time  8    Period  Weeks    Status  New            Plan - 03/20/17 1125    Clinical Impression Statement  Patient was able to complete therapy session without any increase of pain. Patient required minimal verbal cues for posture and form and was able to complete the remainder of the exercises with proper form. Patient continues to have difficulty with standing Left hip extension exercise without forward flexion. Patient educated on the importance hip extension during the gait cycle. Patient reported understanding. Normal response noted after removal of modalities..    Rehab Potential  Good    PT Frequency  2x / week    PT Duration  8 weeks    PT Treatment/Interventions  ADLs/Self Care Home Management;Cryotherapy;Electrical Stimulation;Gait training;Stair  training;Functional mobility training;Therapeutic activities;Therapeutic exercise;Neuromuscular re-education;Patient/family education;Manual  techniques    PT Next Visit Plan   Increase resistance to Red theraband on supine clams and seated hamstring curls to improve strength. Continue Plan of care to improve strength, ROM, and overall function.    Consulted and Agree with Plan of Care  Patient       Patient will benefit from skilled therapeutic intervention in order to improve the following deficits and impairments:  Abnormal gait, Decreased activity tolerance, Decreased range of motion, Decreased strength, Pain  Visit Diagnosis: Pain in left hip  Muscle weakness (generalized)     Problem List Patient Active Problem List   Diagnosis Date Noted  . Renal cell carcinoma of right kidney ([place]) 02/01/2017  . Postoperative anemia due to acute blood loss 02/01/2017  . Renal mass 01/30/2017  . 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 Primary osteoarthritis of left hip 12/25/2016  . Primary localized osteoarthritis of right knee 08/26/2016  . Primary osteoarthritis of right knee 08/25/2016  . Status post revision of total hip 04/24/2016  . ED (erectile dysfunction) of organic origin 07/01/2012  . Malignant neoplasm of prostate ([place]) 07/01/2012  . Microscopic hematuria 07/01/2012  . Cervical stenosis of spinal canal 01/10/2012    Gabriela Eves, PT, DPT 03/20/2017, 12:10 PM  [facility] [address] [place], [place], [address] Phone: [phone]   Fax:  [phone]  Name: BRAIDAN RICCIARDI MRN: [id number] Date of Birth: [birth year]

## 2017-03-24 ENCOUNTER — Encounter: Payer: Self-pay | Admitting: Physical Therapy

## 2017-03-24 ENCOUNTER — Ambulatory Visit: Payer: Medicare Other | Admitting: Physical Therapy

## 2017-03-24 DIAGNOSIS — M25552 Pain in left hip: Secondary | ICD-10-CM

## 2017-03-24 DIAGNOSIS — M6281 Muscle weakness (generalized): Secondary | ICD-10-CM

## 2017-03-24 NOTE — Therapy (Signed)
[facility] [place], [place], [address] Phone: [phone]   Fax:  [phone]  Physical Therapy Treatment  Patient Details  Name: Jonathan Harvey MRN: [id number] Date of Birth: Jan 14, 1946 Referring Provider: [name] MD.   Encounter Date: 03/24/2017  PT End of Session - 03/24/17 1110    Visit Number  4    Number of Visits  16    Date for PT Re-Evaluation  05/12/17    Authorization Type  FOTO.Marland Kitchenat least every 5th visit.    PT Start Time  1036    PT Stop Time  1131    PT Time Calculation (min)  55 min    Activity Tolerance  Patient tolerated treatment well    Behavior During Therapy  WFL for tasks assessed/performed       Past Medical History:  Diagnosis Date  . Arthritis    "all over" (12/25/2016)  . Chronic lower back pain    "severe" (12/25/2016)  . Hepatitis B 2008  . History of kidney stones   . Hypertension   . Hypertriglyceridemia   . Prostate cancer ([place]) 10/2006   s/p robotic prostatectomy   . Renal cell carcinoma, right ([place])   . Spinal stenosis     Past Surgical History:  Procedure Laterality Date  . CARPAL TUNNEL RELEASE Right 2010  . CYST EXCISION Left    elbow  . FEMUR SURGERY Left ~ 1962   "femur was growing crooked; broke it to straighten it out; body cast for ~ 4 months"  . FINGER SURGERY Right 1987   right thumb and index finger (pt denies this hx on 12/25/2016)  . FRACTURE SURGERY    . INGUINAL HERNIA REPAIR Left 2008  . JOINT REPLACEMENT    . MASTOIDECTOMY  1982  . ORIF GREATER TROCHANTERIC FRACTURE Left 12/25/2016    ACETABULAR REVISION/notes 12/25/2016  . PROSTATECTOMY  10/2006  . ROBOTIC ASSITED PARTIAL NEPHRECTOMY Right 01/30/2017   Procedure: ROBOTIC ASSITED PARTIAL NEPHRECTOMY;  Surgeon: Ardis Hughs, MD;  [facility];  Service: Urology;  Laterality: Right;  . TOTAL HIP ARTHROPLASTY Left 2002  . TOTAL HIP REVISION Left 04/24/2016   Procedure: TOTAL HIP REVISION;   Surgeon: [name], MD;  Location: [place];  Service: Orthopedics;  Laterality: Left;  . TOTAL HIP REVISION Left 12/25/2016   Procedure: OPEN REDUCTION INTERNAL FIXATION GREATER TROCHANTERIC FRACTURE, ACETABULAR REVISION;  Surgeon: [name], MD;  Location: [facility];  Service: Orthopedics;  Laterality: Left;  . TOTAL KNEE ARTHROPLASTY Right 08/26/2016   Procedure: RIGHT TOTAL KNEE ARTHROPLASTY;  Surgeon: [name], MD;  Location: [place];  Service: Orthopedics;  Laterality: Right;    There were no vitals filed for this visit.  Subjective Assessment - 03/24/17 1039    Subjective  Patient arrived with ongoing discomfort    Pertinent History  3 left hip surgeries; right TKA.    How long can you stand comfortably?  10 minutes.    How long can you walk comfortably?  Short distances in community.    Patient Stated Goals  Walk and get around with no pain.    Currently in Pain?  Yes    Pain Score  6     Pain Orientation  Left    Pain Descriptors / Indicators  Discomfort    Pain Type  Chronic pain    Pain Onset  More than a month ago    Pain Frequency  Constant    Aggravating Factors  prolong sitting    Pain Relieving Factors  meds and standing                      [facility] Adult PT Treatment/Exercise - 03/24/17 0001      Knee/Hip Exercises: Aerobic   Nustep  L6, seat 11 x15 min      Knee/Hip Exercises: Standing   Other Standing Knee Exercises  standing mini wall squat x20      Knee/Hip Exercises: Seated   Hamstring Curl  Strengthening;Left;20 reps;10 reps    Hamstring Limitations  red t-band      Knee/Hip Exercises: Supine   Bridges  Strengthening;20 reps with red t-band for hip abd resistance    Straight Leg Raise with External Rotation  Strengthening;Left;2 sets;10 reps    Other Supine Knee/Hip Exercises  Supine hip abduction, knees bent, red theraband, 3x10 with LT LE only      Knee/Hip Exercises: Sidelying   Hip ABduction  Strengthening;Left;10 reps    Clams   2x10      Electrical Stimulation   Electrical Stimulation Location  Left Hip    Electrical Stimulation Action  preod    Electrical Stimulation Parameters  80-150hz  x23min    Electrical Stimulation Goals  Pain               PT Short Term Goals - 03/24/17 1113      PT SHORT TERM GOAL #1   Title  Independent with an initial HEP.    Time  2    Period  Weeks    Status  On-going        PT Long Term Goals - 03/24/17 1113      PT LONG TERM GOAL #1   Title  Independent with an advanced HEP.    Time  8    Period  Weeks    Status  On-going      PT LONG TERM GOAL #2   Title  Right hip abduction to a solid 4/5 to increase stability for walking and functional actiivtes.    Time  8    Period  Weeks    Status  On-going      PT LONG TERM GOAL #3   Title  Perform ADL's with pain not > 3/10.    Time  8    Period  Weeks    Status  On-going      PT LONG TERM GOAL #4   Title  Perform a reciprocating stair gait with one railing with pain not > 2-3/10.    Time  8    Period  Weeks    Status  On-going      PT LONG TERM GOAL #5   Title  Walk a community distance with pain not > 3/10.    Time  8    Period  Weeks    Status  On-going            Plan - 03/24/17 1133    Clinical Impression Statement  Patient tolerated treatment well today. Patient able to progress with hip strengthening exercises with no increased pain. Patient had some muscle fatigue and compensation with exercises and required educational cues. Patient current goals ongoing due to strength and pain deficts.     Rehab Potential  Good    PT Frequency  2x / week    PT Duration  8 weeks    PT Treatment/Interventions  ADLs/Self Care Home Management;Cryotherapy;Electrical Stimulation;Gait training;Stair training;Functional mobility training;Therapeutic  activities;Therapeutic exercise;Neuromuscular re-education;Patient/family education;Manual techniques    PT Next Visit Plan  Continue Plan of care to improve  strength, ROM, and overall function. Issue initial HEP    Consulted and Agree with Plan of Care  Patient       Patient will benefit from skilled therapeutic intervention in order to improve the following deficits and impairments:  Abnormal gait, Decreased activity tolerance, Decreased range of motion, Decreased strength, Pain  Visit Diagnosis: Pain in left hip  Muscle weakness (generalized)     Problem List Patient Active Problem List   Diagnosis Date Noted  . Renal cell carcinoma of right kidney ([place]) 02/01/2017  . Postoperative anemia due to acute blood loss 02/01/2017  . Renal mass 01/30/2017  . 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 Primary osteoarthritis of left hip 12/25/2016  . Primary localized osteoarthritis of right knee 08/26/2016  . Primary osteoarthritis of right knee 08/25/2016  . Status post revision of total hip 04/24/2016  . ED (erectile dysfunction) of organic origin 07/01/2012  . Malignant neoplasm of prostate ([place]) 07/01/2012  . Microscopic hematuria 07/01/2012  . Cervical stenosis of spinal canal 01/10/2012    Sweet Jarvis P, PTA 03/24/2017, 11:38 AM  [facility] [place], [place], [address] Phone: [phone]   Fax:  [phone]  Name: Jonathan Harvey MRN: [id number] Date of Birth: 1945/06/17

## 2017-03-27 ENCOUNTER — Ambulatory Visit: Payer: Medicare Other | Admitting: Physical Therapy

## 2017-03-27 ENCOUNTER — Encounter: Payer: Self-pay | Admitting: Physical Therapy

## 2017-03-27 DIAGNOSIS — M25552 Pain in left hip: Secondary | ICD-10-CM | POA: Diagnosis not present

## 2017-03-27 DIAGNOSIS — M6281 Muscle weakness (generalized): Secondary | ICD-10-CM

## 2017-03-27 NOTE — Therapy (Signed)
[facility] [place], [place], [address] Phone: [phone]   Fax:  [phone]  Physical Therapy Treatment  Patient Details  Name: EDWING FIGLEY MRN: [id number] Date of Birth: Jan 19, 1946 Referring Provider: [name] MD.   Encounter Date: 03/27/2017  PT End of Session - 03/27/17 1114    Visit Number  5    Number of Visits  16    Date for PT Re-Evaluation  05/12/17    Authorization Type  FOTO.Marland Kitchenat least every 5th visit.    PT Start Time  1047    PT Stop Time  1128    PT Time Calculation (min)  41 min    Activity Tolerance  Patient tolerated treatment well    Behavior During Therapy  WFL for tasks assessed/performed       Past Medical History:  Diagnosis Date  . Arthritis    "all over" (12/25/2016)  . Chronic lower back pain    "severe" (12/25/2016)  . Hepatitis B 2008  . History of kidney stones   . Hypertension   . Hypertriglyceridemia   . Prostate cancer ([place]) 10/2006   s/p robotic prostatectomy   . Renal cell carcinoma, right ([place])   . Spinal stenosis     Past Surgical History:  Procedure Laterality Date  . CARPAL TUNNEL RELEASE Right 2010  . CYST EXCISION Left    elbow  . FEMUR SURGERY Left ~ 1962   "femur was growing crooked; broke it to straighten it out; body cast for ~ 4 months"  . FINGER SURGERY Right 1987   right thumb and index finger (pt denies this hx on 12/25/2016)  . FRACTURE SURGERY    . INGUINAL HERNIA REPAIR Left 2008  . JOINT REPLACEMENT    . MASTOIDECTOMY  1982  . ORIF GREATER TROCHANTERIC FRACTURE Left 12/25/2016    ACETABULAR REVISION/notes 12/25/2016  . PROSTATECTOMY  10/2006  . ROBOTIC ASSITED PARTIAL NEPHRECTOMY Right 01/30/2017   Procedure: ROBOTIC ASSITED PARTIAL NEPHRECTOMY;  Surgeon: Ardis Hughs, MD;  [facility];  Service: Urology;  Laterality: Right;  . TOTAL HIP ARTHROPLASTY Left 2002  . TOTAL HIP REVISION Left 04/24/2016   Procedure: TOTAL HIP REVISION;   Surgeon: [name], MD;  Location: [place];  Service: Orthopedics;  Laterality: Left;  . TOTAL HIP REVISION Left 12/25/2016   Procedure: OPEN REDUCTION INTERNAL FIXATION GREATER TROCHANTERIC FRACTURE, ACETABULAR REVISION;  Surgeon: [name], MD;  Location: [place];  Service: Orthopedics;  Laterality: Left;  . TOTAL KNEE ARTHROPLASTY Right 08/26/2016   Procedure: RIGHT TOTAL KNEE ARTHROPLASTY;  Surgeon: [name], MD;  Location: [place];  Service: Orthopedics;  Laterality: Right;    There were no vitals filed for this visit.  Subjective Assessment - 03/27/17 1051    Subjective  Patient arrived with ongoing discomfort, hurt back after doing too much at home    Pertinent History  3 left hip surgeries; right TKA.    How long can you stand comfortably?  10 minutes.    How long can you walk comfortably?  Short distances in community.    Patient Stated Goals  Walk and get around with no pain.    Currently in Pain?  Yes    Pain Score  6     Pain Location  Hip    Pain Orientation  Left    Pain Descriptors / Indicators  Discomfort    Pain Type  Chronic pain    Pain Onset  More than a  month ago    Pain Frequency  Constant    Aggravating Factors   prolong sitting    Pain Relieving Factors  meds                      [facility] Adult PT Treatment/Exercise - 03/27/17 0001      Knee/Hip Exercises: Aerobic   Nustep  L6, seat 11 x15 min      Knee/Hip Exercises: Standing   Hip Extension  Stengthening;Left;2 sets;10 reps;Knee straight;Limitations    Extension Limitations  yellow t-band    Lateral Step Up  Left;20 reps;Step Height: 4";Hand Hold: 1    Forward Step Up  Left;20 reps;Step Height: 4";Hand Hold: 0 finger hold for balance      Knee/Hip Exercises: Seated   Abduction/Adduction   Strengthening;Left;20 reps;Limitations    Abd/Adduction Limitations  green t-band for hip abd    Sit to [place]  10 reps;with UE support      Electrical Stimulation   Electrical Stimulation Location   Left Hip    Electrical Stimulation Action  premod    Electrical Stimulation Parameters  80-150hz  x31min    Electrical Stimulation Goals  Pain               PT Short Term Goals - 03/24/17 1113      PT SHORT TERM GOAL #1   Title  Independent with an initial HEP.    Time  2    Period  Weeks    Status  On-going        PT Long Term Goals - 03/24/17 1113      PT LONG TERM GOAL #1   Title  Independent with an advanced HEP.    Time  8    Period  Weeks    Status  On-going      PT LONG TERM GOAL #2   Title  Right hip abduction to a solid 4/5 to increase stability for walking and functional actiivtes.    Time  8    Period  Weeks    Status  On-going      PT LONG TERM GOAL #3   Title  Perform ADL's with pain not > 3/10.    Time  8    Period  Weeks    Status  On-going      PT LONG TERM GOAL #4   Title  Perform a reciprocating stair gait with one railing with pain not > 2-3/10.    Time  8    Period  Weeks    Status  On-going      PT LONG TERM GOAL #5   Title  Walk a community distance with pain not > 3/10.    Time  8    Period  Weeks    Status  On-going            Plan - 03/27/17 1115    Clinical Impression Statement  Patient tolerated treatment well fair today due to increased low back discomfort after doing too much at home per reported. Patient able to progress with strengthening exercises per tolerance. Patient progressing toward goals with ongoing strength and pain deficts.     Rehab Potential  Good    PT Frequency  2x / week    PT Duration  8 weeks    PT Treatment/Interventions  ADLs/Self Care Home Management;Cryotherapy;Electrical Stimulation;Gait training;Stair training;Functional mobility training;Therapeutic activities;Therapeutic exercise;Neuromuscular re-education;Patient/family education;Manual techniques    PT Next Visit Plan  Continue  Plan of care to improve strength, ROM, and overall function. Issue initial HEP    Consulted and Agree with Plan  of Care  Patient       Patient will benefit from skilled therapeutic intervention in order to improve the following deficits and impairments:  Abnormal gait, Decreased activity tolerance, Decreased range of motion, Decreased strength, Pain  Visit Diagnosis: Pain in left hip  Muscle weakness (generalized)     Problem List Patient Active Problem List   Diagnosis Date Noted  . Renal cell carcinoma of right kidney ([place]) 02/01/2017  . Postoperative anemia due to acute blood loss 02/01/2017  . Renal mass 01/30/2017  . 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 Primary osteoarthritis of left hip 12/25/2016  . Primary localized osteoarthritis of right knee 08/26/2016  . Primary osteoarthritis of right knee 08/25/2016  . Status post revision of total hip 04/24/2016  . ED (erectile dysfunction) of organic origin 07/01/2012  . Malignant neoplasm of prostate ([place]) 07/01/2012  . Microscopic hematuria 07/01/2012  . Cervical stenosis of spinal canal 01/10/2012    [place] [facility], CHRISTINA P 03/27/2017, 11:28 AM  [facility] [place], [place], [address] Phone: [phone]   Fax:  [phone]  Name: PRESTEN JOOST MRN: [id number] Date of Birth: 03-Feb-1946

## 2017-03-31 ENCOUNTER — Encounter: Payer: Self-pay | Admitting: Physical Therapy

## 2017-03-31 ENCOUNTER — Ambulatory Visit: Payer: Medicare Other | Admitting: Physical Therapy

## 2017-03-31 DIAGNOSIS — M25552 Pain in left hip: Secondary | ICD-10-CM

## 2017-03-31 DIAGNOSIS — M6281 Muscle weakness (generalized): Secondary | ICD-10-CM

## 2017-03-31 NOTE — Therapy (Signed)
[facility] [place], [place], [address] Phone: [phone]   Fax:  [phone]  Physical Therapy Treatment  Patient Details  Name: Jonathan Harvey MRN: [id number] Date of Birth: December 14, 1945 Referring Provider: [name] MD.   Encounter Date: 03/31/2017  PT End of Session - 03/31/17 1123    Visit Number  6    Number of Visits  16    Date for PT Re-Evaluation  05/12/17    Authorization Type  FOTO.Marland Kitchenat least every 5th visit.    PT Start Time  1036    PT Stop Time  1133    PT Time Calculation (min)  57 min    Activity Tolerance  Patient tolerated treatment well    Behavior During Therapy  WFL for tasks assessed/performed       Past Medical History:  Diagnosis Date  . Arthritis    "all over" (12/25/2016)  . Chronic lower back pain    "severe" (12/25/2016)  . Hepatitis B 2008  . History of kidney stones   . Hypertension   . Hypertriglyceridemia   . Prostate cancer ([place]) 10/2006   s/p robotic prostatectomy   . Renal cell carcinoma, right ([place])   . Spinal stenosis     Past Surgical History:  Procedure Laterality Date  . CARPAL TUNNEL RELEASE Right 2010  . CYST EXCISION Left    elbow  . FEMUR SURGERY Left ~ 1962   "femur was growing crooked; broke it to straighten it out; body cast for ~ 4 months"  . FINGER SURGERY Right 1987   right thumb and index finger (pt denies this hx on 12/25/2016)  . FRACTURE SURGERY    . INGUINAL HERNIA REPAIR Left 2008  . JOINT REPLACEMENT    . MASTOIDECTOMY  1982  . ORIF GREATER TROCHANTERIC FRACTURE Left 12/25/2016    ACETABULAR REVISION/notes 12/25/2016  . PROSTATECTOMY  10/2006  . ROBOTIC ASSITED PARTIAL NEPHRECTOMY Right 01/30/2017   Procedure: ROBOTIC ASSITED PARTIAL NEPHRECTOMY;  Surgeon: Ardis Hughs, MD;  Location: [facility];  Service: Urology;  Laterality: Right;  . TOTAL HIP ARTHROPLASTY Left 2002  . TOTAL HIP REVISION Left 04/24/2016   Procedure: TOTAL HIP REVISION;   Surgeon: [name], MD;  Location: [place];  Service: Orthopedics;  Laterality: Left;  . TOTAL HIP REVISION Left 12/25/2016   Procedure: OPEN REDUCTION INTERNAL FIXATION GREATER TROCHANTERIC FRACTURE, ACETABULAR REVISION;  Surgeon: [name], MD;  Location: [place];  Service: Orthopedics;  Laterality: Left;  . TOTAL KNEE ARTHROPLASTY Right 08/26/2016   Procedure: RIGHT TOTAL KNEE ARTHROPLASTY;  Surgeon: [name], MD;  Location: [place];  Service: Orthopedics;  Laterality: Right;    There were no vitals filed for this visit.  Subjective Assessment - 03/31/17 1042    Subjective  Patient arrived with some ongoing discomfort yet better overall and no complaints after last treatment    Pertinent History  3 left hip surgeries; right TKA.    How long can you stand comfortably?  10 minutes.    How long can you walk comfortably?  Short distances in community.    Patient Stated Goals  Walk and get around with no pain.    Currently in Pain?  Yes    Pain Score  5     Pain Location  Hip    Pain Orientation  Left    Pain Descriptors / Indicators  Discomfort    Pain Type  Chronic pain    Pain Onset  More  than a month ago    Pain Frequency  Constant    Aggravating Factors   prolong sitting    Pain Relieving Factors  meds                      [facility] Adult PT Treatment/Exercise - 03/31/17 0001      Knee/Hip Exercises: Aerobic   Nustep  L6, seat 11 x16 min      Knee/Hip Exercises: Standing   Hip Abduction  Stengthening;Left;2 sets;10 reps;Knee straight    Abduction Limitations  yellow t-band    Hip Extension  Stengthening;Left;2 sets;10 reps;Knee straight;Limitations    Extension Limitations  yellow t-band    Lateral Step Up  Left;20 reps;Hand Hold: 1;Step Height: 6"    Forward Step Up  Left;10 reps;Step Height: 6";3 sets      Knee/Hip Exercises: Seated   Abduction/Adduction   Strengthening;Left;20 reps;Limitations    Abd/Adduction Limitations  green t-band for hip abd    Sit  to Sand  10 reps;with UE support;without UE support      Electrical Stimulation   Electrical Stimulation Location  Left Hip    Electrical Stimulation Action  premod    Electrical Stimulation Parameters  80-150hz  x8min    Electrical Stimulation Goals  Pain               PT Short Term Goals - 03/24/17 1113      PT SHORT TERM GOAL #1   Title  Independent with an initial HEP.    Time  2    Period  Weeks    Status  On-going        PT Long Term Goals - 03/24/17 1113      PT LONG TERM GOAL #1   Title  Independent with an advanced HEP.    Time  8    Period  Weeks    Status  On-going      PT LONG TERM GOAL #2   Title  Right hip abduction to a solid 4/5 to increase stability for walking and functional actiivtes.    Time  8    Period  Weeks    Status  On-going      PT LONG TERM GOAL #3   Title  Perform ADL's with pain not > 3/10.    Time  8    Period  Weeks    Status  On-going      PT LONG TERM GOAL #4   Title  Perform a reciprocating stair gait with one railing with pain not > 2-3/10.    Time  8    Period  Weeks    Status  On-going      PT LONG TERM GOAL #5   Title  Walk a community distance with pain not > 3/10.    Time  8    Period  Weeks    Status  On-going            Plan - 03/31/17 1123    Clinical Impression Statement  Patient tolerated treatment well today. Patient progresisng with left hip strengthening exercises today. Patient has ongoing discomfort from back that limits some movement and exercises. Patient going to get epidural for back this week then return to therapy next week. Goals progressing.     Rehab Potential  Good    PT Frequency  2x / week    PT Duration  8 weeks    PT Treatment/Interventions  ADLs/Self Care Home  Management;Cryotherapy;Electrical Stimulation;Gait training;Stair training;Functional mobility training;Therapeutic activities;Therapeutic exercise;Neuromuscular re-education;Patient/family education;Manual techniques     PT Next Visit Plan  Continue Plan of care to improve strength, ROM, and overall function.    Consulted and Agree with Plan of Care  Patient       Patient will benefit from skilled therapeutic intervention in order to improve the following deficits and impairments:  Abnormal gait, Decreased activity tolerance, Decreased range of motion, Decreased strength, Pain  Visit Diagnosis: Pain in left hip  Muscle weakness (generalized)     Problem List Patient Active Problem List   Diagnosis Date Noted  . Renal cell carcinoma of right kidney ([place]) 02/01/2017  . Postoperative anemia due to acute blood loss 02/01/2017  . Renal mass 01/30/2017  . 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 Primary osteoarthritis of left hip 12/25/2016  . Primary localized osteoarthritis of right knee 08/26/2016  . Primary osteoarthritis of right knee 08/25/2016  . Status post revision of total hip 04/24/2016  . ED (erectile dysfunction) of organic origin 07/01/2012  . Malignant neoplasm of prostate ([place]) 07/01/2012  . Microscopic hematuria 07/01/2012  . Cervical stenosis of spinal canal 01/10/2012    Aliyah Abeyta P, PTA 03/31/2017, 11:57 AM  [facility] [place], [place], [address] Phone: [phone]   Fax:  [phone]  Name: HARUTYUN MONTEVERDE MRN: [id number] Date of Birth: [birth year]

## 2017-04-04 ENCOUNTER — Encounter: Payer: Medicare Other | Admitting: Physical Therapy

## 2017-04-07 ENCOUNTER — Ambulatory Visit: Payer: Medicare Other | Attending: Orthopedic Surgery | Admitting: Physical Therapy

## 2017-04-07 ENCOUNTER — Encounter: Payer: Self-pay | Admitting: Physical Therapy

## 2017-04-07 DIAGNOSIS — M25552 Pain in left hip: Secondary | ICD-10-CM | POA: Insufficient documentation

## 2017-04-07 DIAGNOSIS — M6281 Muscle weakness (generalized): Secondary | ICD-10-CM | POA: Diagnosis present

## 2017-04-07 NOTE — Therapy (Signed)
[facility] [place], [place], [address] Phone: [phone]   Fax:  [phone]  Physical Therapy Treatment  Patient Details  Name: Jonathan Harvey MRN: [id number] Date of Birth: Mar 02, 1946 Referring Provider: [name] MD.   Encounter Date: 04/07/2017  PT End of Session - 04/07/17 1115    Visit Number  7    Number of Visits  16    Date for PT Re-Evaluation  05/12/17    Authorization Type  FOTO.Marland Kitchenat least every 5th visit.    PT Start Time  1033    PT Stop Time  1129    PT Time Calculation (min)  56 min    Activity Tolerance  Patient tolerated treatment well    Behavior During Therapy  WFL for tasks assessed/performed       Past Medical History:  Diagnosis Date  . Arthritis    "all over" (12/25/2016)  . Chronic lower back pain    "severe" (12/25/2016)  . Hepatitis B 2008  . History of kidney stones   . Hypertension   . Hypertriglyceridemia   . Prostate cancer ([place]) 10/2006   s/p robotic prostatectomy   . Renal cell carcinoma, right ([place])   . Spinal stenosis     Past Surgical History:  Procedure Laterality Date  . CARPAL TUNNEL RELEASE Right 2010  . CYST EXCISION Left    elbow  . FEMUR SURGERY Left ~ 1962   "femur was growing crooked; broke it to straighten it out; body cast for ~ 4 months"  . FINGER SURGERY Right 1987   right thumb and index finger (pt denies this hx on 12/25/2016)  . FRACTURE SURGERY    . INGUINAL HERNIA REPAIR Left 2008  . JOINT REPLACEMENT    . MASTOIDECTOMY  1982  . ORIF GREATER TROCHANTERIC FRACTURE Left 12/25/2016    ACETABULAR REVISION/notes 12/25/2016  . PROSTATECTOMY  10/2006  . ROBOTIC ASSITED PARTIAL NEPHRECTOMY Right 01/30/2017   Procedure: ROBOTIC ASSITED PARTIAL NEPHRECTOMY;  Surgeon: Ardis Hughs, MD;  [facility];  Service: Urology;  Laterality: Right;  . TOTAL HIP ARTHROPLASTY Left 2002  . TOTAL HIP REVISION Left 04/24/2016   Procedure: TOTAL HIP REVISION;   Surgeon: [name], MD;  Location: [place];  Service: Orthopedics;  Laterality: Left;  . TOTAL HIP REVISION Left 12/25/2016   Procedure: OPEN REDUCTION INTERNAL FIXATION GREATER TROCHANTERIC FRACTURE, ACETABULAR REVISION;  Surgeon: [name], MD;  Location: [place];  Service: Orthopedics;  Laterality: Left;  . TOTAL KNEE ARTHROPLASTY Right 08/26/2016   Procedure: RIGHT TOTAL KNEE ARTHROPLASTY;  Surgeon: [name], MD;  Location: [place];  Service: Orthopedics;  Laterality: Right;    There were no vitals filed for this visit.  Subjective Assessment - 04/07/17 1023    Subjective  Reports that he had another epidural and they will schedule a cauterization. Reports he had some relief of L hip following the epidurals. Reports that he feels the best after 2 pain pills. Patient reports that here lately he has started having anterior L hip pain which is in the region that his hernia pain was in but this pain is different.     Pertinent History  3 left hip surgeries; right TKA.    How long can you stand comfortably?  10 minutes.    How long can you walk comfortably?  Short distances in community.    Patient Stated Goals  Walk and get around with no pain.    Currently in Pain?  Yes    Pain Score  6     Pain Location  Hip    Pain Orientation  Left    Pain Descriptors / Indicators  Discomfort    Pain Type  Chronic pain    Pain Onset  More than a month ago         [facility] PT Assessment - 04/07/17 0001      Assessment   Medical Diagnosis  Left hip replacement.    Onset Date/Surgical Date  12/25/16    Next MD Visit  05/2017      Precautions   Precaution Comments  Patient remains mindful of hip precautions.                  [facility] Adult PT Treatment/Exercise - 04/07/17 0001      Knee/Hip Exercises: Aerobic   Nustep  L6 x15 min      Knee/Hip Exercises: Standing   Hip Abduction  Stengthening;Left;3 sets;10 reps;Knee straight;Limitations    Abduction Limitations  Yellow theraband     Hip Extension  Stengthening;Left;3 sets;10 reps;Knee straight;Limitations    Extension Limitations  yellow theraband    Lateral Step Up  Left;3 sets;10 reps;Hand Hold: 2;Step Height: 6"    Forward Step Up  Left;3 sets;10 reps;Hand Hold: 2;Step Height: 6"    Other Standing Knee Exercises  Side stepping in // bars with yellow theraband x8 reps      Knee/Hip Exercises: Seated   Clamshell with Marga Hoots x20 reps      Modalities   Modalities  Electrical engineer Stimulation Location  L laterla hip    Electrical Stimulation Action  Pre-Mod    Electrical Stimulation Parameters  80-150 hz x15 min    Electrical Stimulation Goals  Pain               PT Short Term Goals - 04/07/17 1121      PT SHORT TERM GOAL #1   Title  Independent with an initial HEP.    Time  2    Period  Weeks    Status  On-going        PT Long Term Goals - 03/24/17 1113      PT LONG TERM GOAL #1   Title  Independent with an advanced HEP.    Time  8    Period  Weeks    Status  On-going      PT LONG TERM GOAL #2   Title  Right hip abduction to a solid 4/5 to increase stability for walking and functional actiivtes.    Time  8    Period  Weeks    Status  On-going      PT LONG TERM GOAL #3   Title  Perform ADL's with pain not > 3/10.    Time  8    Period  Weeks    Status  On-going      PT LONG TERM GOAL #4   Title  Perform a reciprocating stair gait with one railing with pain not > 2-3/10.    Time  8    Period  Weeks    Status  On-going      PT LONG TERM GOAL #5   Title  Walk a community distance with pain not > 3/10.    Time  8    Period  Weeks    Status  On-going  Plan - 04/07/17 1125    Clinical Impression Statement  Patient presented in clinic with continued reports of L hip weakness in abduction. Patient still dedicated to standing exercises completed in PT at home and was provided a red theraband and green theraband  to progress resistance as yellow theraband is becoming easier per patient report. No complaints of pain with any exercises today. Side stepping with resistance initiated today to assist with strengthening and control of L hip. Normal stimulation response noted following removal of the modality.    Rehab Potential  Good    PT Frequency  2x / week    PT Duration  8 weeks    PT Treatment/Interventions  ADLs/Self Care Home Management;Cryotherapy;Electrical Stimulation;Gait training;Stair training;Functional mobility training;Therapeutic activities;Therapeutic exercise;Neuromuscular re-education;Patient/family education;Manual techniques    PT Next Visit Plan  Continue Plan of care to improve strength, ROM, and overall function.    Consulted and Agree with Plan of Care  Patient       Patient will benefit from skilled therapeutic intervention in order to improve the following deficits and impairments:  Abnormal gait, Decreased activity tolerance, Decreased range of motion, Decreased strength, Pain  Visit Diagnosis: Pain in left hip  Muscle weakness (generalized)     Problem List Patient Active Problem List   Diagnosis Date Noted  . Renal cell carcinoma of right kidney ([place]) 02/01/2017  . Postoperative anemia due to acute blood loss 02/01/2017  . Renal mass 01/30/2017  . 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 Primary osteoarthritis of left hip 12/25/2016  . Primary localized osteoarthritis of right knee 08/26/2016  . Primary osteoarthritis of right knee 08/25/2016  . Status post revision of total hip 04/24/2016  . ED (erectile dysfunction) of organic origin 07/01/2012  . Malignant neoplasm of prostate ([place]) 07/01/2012  . Microscopic hematuria 07/01/2012  . Cervical stenosis of spinal canal 01/10/2012    Standley Brooking, PTA 04/07/2017, 11:34 AM  [facility] [address] [place], [place], [address] Phone: [phone]   Fax:   [phone]  Name: IKENNA OHMS MRN: [id number] Date of Birth: Sep 15, 1945

## 2017-04-10 ENCOUNTER — Encounter: Payer: Self-pay | Admitting: *Deleted

## 2017-04-10 ENCOUNTER — Ambulatory Visit: Payer: Medicare Other | Admitting: *Deleted

## 2017-04-10 DIAGNOSIS — M6281 Muscle weakness (generalized): Secondary | ICD-10-CM

## 2017-04-10 DIAGNOSIS — M25552 Pain in left hip: Secondary | ICD-10-CM

## 2017-04-10 NOTE — Therapy (Signed)
[facility] [place], [place], [address] Phone: [phone]   Fax:  [phone]  Physical Therapy Treatment  Patient Details  Name: Jonathan Harvey MRN: [id number] Date of Birth: [birth year] Referring Provider: [name] MD.   Encounter Date: 04/10/2017  PT End of Session - 04/10/17 1159    Visit Number  8    Number of Visits  16    Date for PT Re-Evaluation  05/12/17    PT Start Time  1030    PT Stop Time  1120    PT Time Calculation (min)  50 min       Past Medical History:  Diagnosis Date  . Arthritis    "all over" (12/25/2016)  . Chronic lower back pain    "severe" (12/25/2016)  . Hepatitis B 2008  . History of kidney stones   . Hypertension   . Hypertriglyceridemia   . Prostate cancer ([place]) 10/2006   s/p robotic prostatectomy   . Renal cell carcinoma, right ([place])   . Spinal stenosis     Past Surgical History:  Procedure Laterality Date  . CARPAL TUNNEL RELEASE Right 2010  . CYST EXCISION Left    elbow  . FEMUR SURGERY Left ~ 1962   "femur was growing crooked; broke it to straighten it out; body cast for ~ 4 months"  . FINGER SURGERY Right 1987   right thumb and index finger (pt denies this hx on 12/25/2016)  . FRACTURE SURGERY    . INGUINAL HERNIA REPAIR Left 2008  . JOINT REPLACEMENT    . MASTOIDECTOMY  1982  . ORIF GREATER TROCHANTERIC FRACTURE Left 12/25/2016    ACETABULAR REVISION/notes 12/25/2016  . PROSTATECTOMY  10/2006  . ROBOTIC ASSITED PARTIAL NEPHRECTOMY Right 01/30/2017   Procedure: ROBOTIC ASSITED PARTIAL NEPHRECTOMY;  Surgeon: Ardis Hughs, MD;  [facility];  Service: Urology;  Laterality: Right;  . TOTAL HIP ARTHROPLASTY Left 2002  . TOTAL HIP REVISION Left 04/24/2016   Procedure: TOTAL HIP REVISION;  Surgeon: [name], MD;  Location: [place];  Service: Orthopedics;  Laterality: Left;  . TOTAL HIP REVISION Left 12/25/2016   Procedure: OPEN REDUCTION INTERNAL FIXATION  GREATER TROCHANTERIC FRACTURE, ACETABULAR REVISION;  Surgeon: [name], MD;  Location: [place];  Service: Orthopedics;  Laterality: Left;  . TOTAL KNEE ARTHROPLASTY Right 08/26/2016   Procedure: RIGHT TOTAL KNEE ARTHROPLASTY;  Surgeon: [name], MD;  Location: [place];  Service: Orthopedics;  Laterality: Right;    There were no vitals filed for this visit.  Subjective Assessment - 04/10/17 1039    Subjective  Did ok after last Rx     Pertinent History  3 left hip surgeries; right TKA.    How long can you stand comfortably?  10 minutes.    How long can you walk comfortably?  Short distances in community.    Patient Stated Goals  Walk and get around with no pain.    Currently in Pain?  Yes    Pain Score  6     Pain Location  Hip    Pain Orientation  Left    Pain Descriptors / Indicators  Discomfort    Pain Onset  More than a month ago    Pain Frequency  Constant                      [facility] Adult PT Treatment/Exercise - 04/10/17 0001      Knee/Hip Exercises: Aerobic  Nustep  L6 x15 min      Knee/Hip Exercises: Standing   Lateral Step Up  Left;3 sets;10 reps;Hand Hold: 2;Step Height: 6"    Forward Step Up  Left;3 sets;10 reps;Hand Hold: 2;Step Height: 6"    Other Standing Knee Exercises  Side stepping to RT XTS 2x20 and resisted walking to the RT  3x10      Modalities   Modalities  Electrical Stimulation      Electrical Stimulation   Electrical Stimulation Location  L laterla hip  premod 80-150hz  x 15 mins    Electrical Stimulation Goals  Pain               PT Short Term Goals - 04/07/17 1121      PT SHORT TERM GOAL #1   Title  Independent with an initial HEP.    Time  2    Period  Weeks    Status  On-going        PT Long Term Goals - 03/24/17 1113      PT LONG TERM GOAL #1   Title  Independent with an advanced HEP.    Time  8    Period  Weeks    Status  On-going      PT LONG TERM GOAL #2   Title  Right hip abduction to a solid 4/5 to  increase stability for walking and functional actiivtes.    Time  8    Period  Weeks    Status  On-going      PT LONG TERM GOAL #3   Title  Perform ADL's with pain not > 3/10.    Time  8    Period  Weeks    Status  On-going      PT LONG TERM GOAL #4   Title  Perform a reciprocating stair gait with one railing with pain not > 2-3/10.    Time  8    Period  Weeks    Status  On-going      PT LONG TERM GOAL #5   Title  Walk a community distance with pain not > 3/10.    Time  8    Period  Weeks    Status  On-going            Plan - 04/10/17 1200    Clinical Impression Statement  Pt arrived today doing fairly well, but continues to have L hip and LB pain. He was able to perform all LT LE strengthening exs without increased pain. XTS resisited exs were added today for strengthening and challenge Pts balance. Normal response to Estim today.    Rehab Potential  Good    PT Frequency  2x / week    PT Duration  8 weeks    PT Treatment/Interventions  ADLs/Self Care Home Management;Cryotherapy;Electrical Stimulation;Gait training;Stair training;Functional mobility training;Therapeutic activities;Therapeutic exercise;Neuromuscular re-education;Patient/family education;Manual techniques    PT Next Visit Plan  Continue Plan of care to improve strength, ROM, and overall function.    Consulted and Agree with Plan of Care  Patient       Patient will benefit from skilled therapeutic intervention in order to improve the following deficits and impairments:  Abnormal gait, Decreased activity tolerance, Decreased range of motion, Decreased strength, Pain  Visit Diagnosis: Pain in left hip  Muscle weakness (generalized)     Problem List Patient Active Problem List   Diagnosis Date Noted  . Renal cell carcinoma of right kidney ([place]) 02/01/2017  .  Postoperative anemia due to acute blood loss 02/01/2017  . Renal mass 01/30/2017  . 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  Primary osteoarthritis of left hip 12/25/2016  . Primary localized osteoarthritis of right knee 08/26/2016  . Primary osteoarthritis of right knee 08/25/2016  . Status post revision of total hip 04/24/2016  . ED (erectile dysfunction) of organic origin 07/01/2012  . Malignant neoplasm of prostate ([place]) 07/01/2012  . Microscopic hematuria 07/01/2012  . Cervical stenosis of spinal canal 01/10/2012    Ronnisha Felber,[name], PTA 04/10/2017, 12:03 PM  [facility] [place], [place], [address] Phone: [phone]   Fax:  [phone]  Name: YOUSOF ALDERMAN MRN: [id number] Date of Birth: [birth year]

## 2017-04-14 ENCOUNTER — Encounter: Payer: Self-pay | Admitting: Physical Therapy

## 2017-04-14 ENCOUNTER — Ambulatory Visit: Payer: Medicare Other | Admitting: Physical Therapy

## 2017-04-14 DIAGNOSIS — M6281 Muscle weakness (generalized): Secondary | ICD-10-CM

## 2017-04-14 DIAGNOSIS — M25552 Pain in left hip: Secondary | ICD-10-CM | POA: Diagnosis not present

## 2017-04-14 NOTE — Therapy (Signed)
[facility] [place], [place], [address] Phone: [phone]   Fax:  [phone]  Physical Therapy Treatment  Patient Details  Name: Jonathan Harvey MRN: [id number] Date of Birth: October 12, 1945 Referring Provider: [name] MD.   Encounter Date: 04/14/2017  PT End of Session - 04/14/17 1045    Visit Number  9    Number of Visits  16    Date for PT Re-Evaluation  05/12/17    Authorization Type  FOTO.Marland Kitchenat least every 5th visit.    PT Start Time  1034    PT Stop Time  1121    PT Time Calculation (min)  47 min    Activity Tolerance  Patient tolerated treatment well    Behavior During Therapy  WFL for tasks assessed/performed       Past Medical History:  Diagnosis Date  . Arthritis    "all over" (12/25/2016)  . Chronic lower back pain    "severe" (12/25/2016)  . Hepatitis B 2008  . History of kidney stones   . Hypertension   . Hypertriglyceridemia   . Prostate cancer ([place]) 10/2006   s/p robotic prostatectomy   . Renal cell carcinoma, right ([place])   . Spinal stenosis     Past Surgical History:  Procedure Laterality Date  . CARPAL TUNNEL RELEASE Right 2010  . CYST EXCISION Left    elbow  . FEMUR SURGERY Left ~ 1962   "femur was growing crooked; broke it to straighten it out; body cast for ~ 4 months"  . FINGER SURGERY Right 1987   right thumb and index finger (pt denies this hx on 12/25/2016)  . FRACTURE SURGERY    . INGUINAL HERNIA REPAIR Left 2008  . JOINT REPLACEMENT    . MASTOIDECTOMY  1982  . ORIF GREATER TROCHANTERIC FRACTURE Left 12/25/2016    ACETABULAR REVISION/notes 12/25/2016  . PROSTATECTOMY  10/2006  . ROBOTIC ASSITED PARTIAL NEPHRECTOMY Right 01/30/2017   Procedure: ROBOTIC ASSITED PARTIAL NEPHRECTOMY;  Surgeon: Ardis Hughs, MD;  Location: [facility];  Service: Urology;  Laterality: Right;  . TOTAL HIP ARTHROPLASTY Left 2002  . TOTAL HIP REVISION Left 04/24/2016   Procedure: TOTAL HIP REVISION;   Surgeon: [name], MD;  Location: [place];  Service: Orthopedics;  Laterality: Left;  . TOTAL HIP REVISION Left 12/25/2016   Procedure: OPEN REDUCTION INTERNAL FIXATION GREATER TROCHANTERIC FRACTURE, ACETABULAR REVISION;  Surgeon: [name], MD;  Location: [place];  Service: Orthopedics;  Laterality: Left;  . TOTAL KNEE ARTHROPLASTY Right 08/26/2016   Procedure: RIGHT TOTAL KNEE ARTHROPLASTY;  Surgeon: [name], MD;  Location: [place];  Service: Orthopedics;  Laterality: Right;    There were no vitals filed for this visit.  Subjective Assessment - 04/14/17 1043    Subjective  Reports that he tripped this weekend as he was dragging a water hose to a rough field by his house. Reports that he did not try to catch himself when he fell to avoid injury.    Pertinent History  3 left hip surgeries; right TKA.    How long can you stand comfortably?  10 minutes.    How long can you walk comfortably?  Short distances in community.    Patient Stated Goals  Walk and get around with no pain.    Currently in Pain?  Yes    Pain Score  6     Pain Location  Hip    Pain Orientation  Left    Pain  Descriptors / Indicators  Discomfort    Pain Type  Chronic pain    Pain Onset  More than a month ago         [facility] PT Assessment - 04/14/17 0001      Assessment   Medical Diagnosis  Left hip replacement.    Onset Date/Surgical Date  12/25/16    Next MD Visit  05/2017      Precautions   Precaution Comments  Patient remains mindful of hip precautions.      Restrictions   Weight Bearing Restrictions  No                  [facility] Adult PT Treatment/Exercise - 04/14/17 0001      Knee/Hip Exercises: Aerobic   Nustep  L7 x15 min      Knee/Hip Exercises: Machines for Strengthening   Cybex Knee Extension  10# 3x10 reps    Cybex Knee Flexion  30# 3x10 reps    Cybex Leg Press  2 pl, seat 6 x20 reps      Knee/Hip Exercises: Supine   Bridges with Clamshell  Strengthening;Both;20 reps Green  theraband      Modalities   Modalities  Electrical engineer Stimulation Location  L lateral hip    Electrical Stimulation Action  Pre-Mod    Electrical Stimulation Parameters  80-150 hz x15 min    Electrical Stimulation Goals  Pain               PT Short Term Goals - 04/07/17 1121      PT SHORT TERM GOAL #1   Title  Independent with an initial HEP.    Time  2    Period  Weeks    Status  On-going        PT Long Term Goals - 03/24/17 1113      PT LONG TERM GOAL #1   Title  Independent with an advanced HEP.    Time  8    Period  Weeks    Status  On-going      PT LONG TERM GOAL #2   Title  Right hip abduction to a solid 4/5 to increase stability for walking and functional actiivtes.    Time  8    Period  Weeks    Status  On-going      PT LONG TERM GOAL #3   Title  Perform ADL's with pain not > 3/10.    Time  8    Period  Weeks    Status  On-going      PT LONG TERM GOAL #4   Title  Perform a reciprocating stair gait with one railing with pain not > 2-3/10.    Time  8    Period  Weeks    Status  On-going      PT LONG TERM GOAL #5   Title  Walk a community distance with pain not > 3/10.    Time  8    Period  Weeks    Status  On-going            Plan - 04/14/17 1113    Clinical Impression Statement  Patient tolerated today's treatment well as he was advanced more with machine resisted exercises. Patient able to tolerate all exercises well with no complaints of pain with any exercises. Bridge with clam initiated today for more functional and complex strengthening with green theraband. Normal  modalities response noted following removal of the modalities.    Rehab Potential  Good    PT Frequency  2x / week    PT Duration  8 weeks    PT Treatment/Interventions  ADLs/Self Care Home Management;Cryotherapy;Electrical Stimulation;Gait training;Stair training;Functional mobility training;Therapeutic  activities;Therapeutic exercise;Neuromuscular re-education;Patient/family education;Manual techniques    PT Next Visit Plan  Continue Plan of care to improve strength, ROM, and overall function.    Consulted and Agree with Plan of Care  Patient       Patient will benefit from skilled therapeutic intervention in order to improve the following deficits and impairments:  Abnormal gait, Decreased activity tolerance, Decreased range of motion, Decreased strength, Pain  Visit Diagnosis: Pain in left hip  Muscle weakness (generalized)     Problem List Patient Active Problem List   Diagnosis Date Noted  . Renal cell carcinoma of right kidney ([place]) 02/01/2017  . Postoperative anemia due to acute blood loss 02/01/2017  . Renal mass 01/30/2017  . 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 Primary osteoarthritis of left hip 12/25/2016  . Primary localized osteoarthritis of right knee 08/26/2016  . Primary osteoarthritis of right knee 08/25/2016  . Status post revision of total hip 04/24/2016  . ED (erectile dysfunction) of organic origin 07/01/2012  . Malignant neoplasm of prostate ([place]) 07/01/2012  . Microscopic hematuria 07/01/2012  . Cervical stenosis of spinal canal 01/10/2012    Standley Brooking, PTA 04/14/2017, 11:30 AM  [facility] [place], [place], [address] Phone: [phone]   Fax:  [phone]  Name: Jonathan Harvey MRN: [id number] Date of Birth: 1945/04/13

## 2017-04-17 ENCOUNTER — Encounter: Payer: Medicare Other | Admitting: Physical Therapy

## 2017-04-18 ENCOUNTER — Encounter: Payer: Self-pay | Admitting: Physical Therapy

## 2017-04-18 ENCOUNTER — Ambulatory Visit: Payer: Medicare Other | Admitting: Physical Therapy

## 2017-04-18 DIAGNOSIS — M25552 Pain in left hip: Secondary | ICD-10-CM

## 2017-04-18 DIAGNOSIS — M6281 Muscle weakness (generalized): Secondary | ICD-10-CM

## 2017-04-18 NOTE — Therapy (Signed)
[facility] [place], [place], [address] Phone: [phone]   Fax:  [phone]  Physical Therapy Treatment  Patient Details  Name: Jonathan Harvey MRN: [id number] Date of Birth: [birth year] Referring Provider: [name] MD.   Encounter Date: 04/18/2017  PT End of Session - 04/18/17 1117    Visit Number  10    Number of Visits  16    Date for PT Re-Evaluation  05/12/17    Authorization Type  FOTO.Marland Kitchenat least every 5th visit.    PT Start Time  1116    PT Stop Time  1223    PT Time Calculation (min)  67 min    Activity Tolerance  Patient tolerated treatment well    Behavior During Therapy  WFL for tasks assessed/performed       Past Medical History:  Diagnosis Date  . Arthritis    "all over" (12/25/2016)  . Chronic lower back pain    "severe" (12/25/2016)  . Hepatitis B 2008  . History of kidney stones   . Hypertension   . Hypertriglyceridemia   . Prostate cancer ([place]) 10/2006   s/p robotic prostatectomy   . Renal cell carcinoma, right ([place])   . Spinal stenosis     Past Surgical History:  Procedure Laterality Date  . CARPAL TUNNEL RELEASE Right 2010  . CYST EXCISION Left    elbow  . FEMUR SURGERY Left ~ 1962   "femur was growing crooked; broke it to straighten it out; body cast for ~ 4 months"  . FINGER SURGERY Right 1987   right thumb and index finger (pt denies this hx on 12/25/2016)  . FRACTURE SURGERY    . INGUINAL HERNIA REPAIR Left 2008  . JOINT REPLACEMENT    . MASTOIDECTOMY  1982  . ORIF GREATER TROCHANTERIC FRACTURE Left 12/25/2016    ACETABULAR REVISION/notes 12/25/2016  . PROSTATECTOMY  10/2006  . ROBOTIC ASSITED PARTIAL NEPHRECTOMY Right 01/30/2017   Procedure: ROBOTIC ASSITED PARTIAL NEPHRECTOMY;  Surgeon: Ardis Hughs, MD;  [facility];  Service: Urology;  Laterality: Right;  . TOTAL HIP ARTHROPLASTY Left 2002  . TOTAL HIP REVISION Left 04/24/2016   Procedure: TOTAL HIP REVISION;   Surgeon: [name], MD;  Location: [place];  Service: Orthopedics;  Laterality: Left;  . TOTAL HIP REVISION Left 12/25/2016   Procedure: OPEN REDUCTION INTERNAL FIXATION GREATER TROCHANTERIC FRACTURE, ACETABULAR REVISION;  Surgeon: [name], MD;  Location: [place];  Service: Orthopedics;  Laterality: Left;  . TOTAL KNEE ARTHROPLASTY Right 08/26/2016   Procedure: RIGHT TOTAL KNEE ARTHROPLASTY;  Surgeon: [name], MD;  Location: [place];  Service: Orthopedics;  Laterality: Right;    There were no vitals filed for this visit.  Subjective Assessment - 04/18/17 1114    Subjective  Reports that his hip is still sore but thinks that his back pain causes the hip discomfort more than anything. Reports that he is scheduled for cauterization on March 15th or 16th.    Pertinent History  3 left hip surgeries; right TKA.    How long can you stand comfortably?  10 minutes.    How long can you walk comfortably?  Short distances in community.    Patient Stated Goals  Walk and get around with no pain.    Currently in Pain?  Yes    Pain Score  6     Pain Location  Hip    Pain Orientation  Left    Pain Descriptors / Indicators  Discomfort    Pain Type  Chronic pain    Pain Onset  More than a month ago         [facility] PT Assessment - 04/18/17 0001      Assessment   Medical Diagnosis  Left hip replacement.    Onset Date/Surgical Date  12/25/16    Next MD Visit  05/2017      Precautions   Precaution Comments  Patient remains mindful of hip precautions.      Restrictions   Weight Bearing Restrictions  No                  [facility] Adult PT Treatment/Exercise - 04/18/17 0001      Knee/Hip Exercises: Aerobic   Nustep  L6 x15 min      Knee/Hip Exercises: Machines for Strengthening   Cybex Knee Extension  10# 3x10 reps    Cybex Knee Flexion  30# 3x10 reps    Cybex Leg Press  2 pl, seat 6 x30 reps      Knee/Hip Exercises: Standing   Step Down  Left;20 reps;Hand Hold: 2;Step Height: 4"     Walking with Sports Cord  Pink XTS 4D resisted walking x15 reps each      Modalities   Modalities  Electrical engineer Stimulation Location  L lateral hip    Electrical Stimulation Action  Pre-Mod    Electrical Stimulation Parameters  80-150 hz x15 min    Electrical Stimulation Goals  Pain               PT Short Term Goals - 04/18/17 1128      PT SHORT TERM GOAL #1   Title  Independent with an initial HEP.    Time  2    Period  Weeks    Status  Achieved via verbal instructions for exercise        PT Long Term Goals - 03/24/17 1113      PT LONG TERM GOAL #1   Title  Independent with an advanced HEP.    Time  8    Period  Weeks    Status  On-going      PT LONG TERM GOAL #2   Title  Right hip abduction to a solid 4/5 to increase stability for walking and functional actiivtes.    Time  8    Period  Weeks    Status  On-going      PT LONG TERM GOAL #3   Title  Perform ADL's with pain not > 3/10.    Time  8    Period  Weeks    Status  On-going      PT LONG TERM GOAL #4   Title  Perform a reciprocating stair gait with one railing with pain not > 2-3/10.    Time  8    Period  Weeks    Status  On-going      PT LONG TERM GOAL #5   Title  Walk a community distance with pain not > 3/10.    Time  8    Period  Weeks    Status  On-going            Plan - 04/18/17 1219    Clinical Impression Statement  Patient tolerated today's treatment well as he had no reports of any increased pain with any exercises. Prolonged holds at end range encouraged by PTA with  all exercises. Patient reported difficulty and weakness noted in L quad with forward step downs. Very minimal instability with resisted XTS walking in 4 directions. Both lateral walking appeared to be more challenging for patient. Normal modalities response noted following removal of the modalities.    Rehab Potential  Good    PT Frequency  2x / week    PT  Duration  8 weeks    PT Treatment/Interventions  ADLs/Self Care Home Management;Cryotherapy;Electrical Stimulation;Gait training;Stair training;Functional mobility training;Therapeutic activities;Therapeutic exercise;Neuromuscular re-education;Patient/family education;Manual techniques    PT Next Visit Plan  Continue Plan of care to improve strength, ROM, and overall function.    Consulted and Agree with Plan of Care  Patient       Patient will benefit from skilled therapeutic intervention in order to improve the following deficits and impairments:  Abnormal gait, Decreased activity tolerance, Decreased range of motion, Decreased strength, Pain  Visit Diagnosis: Pain in left hip  Muscle weakness (generalized)     Problem List Patient Active Problem List   Diagnosis Date Noted  . Renal cell carcinoma of right kidney ([place]) 02/01/2017  . Postoperative anemia due to acute blood loss 02/01/2017  . Renal mass 01/30/2017  . 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 Primary osteoarthritis of left hip 12/25/2016  . Primary localized osteoarthritis of right knee 08/26/2016  . Primary osteoarthritis of right knee 08/25/2016  . Status post revision of total hip 04/24/2016  . ED (erectile dysfunction) of organic origin 07/01/2012  . Malignant neoplasm of prostate ([place]) 07/01/2012  . Microscopic hematuria 07/01/2012  . Cervical stenosis of spinal canal 01/10/2012    Standley Brooking, PTA 04/18/2017, 12:27 PM  [facility] [address] [place], [place], [address] Phone: [phone]   Fax:  [phone]  Name: MYREON WIMER MRN: [id number] Date of Birth: Jul 25, 1945

## 2017-04-21 ENCOUNTER — Ambulatory Visit: Payer: Medicare Other | Admitting: Physical Therapy

## 2017-04-21 DIAGNOSIS — M25552 Pain in left hip: Secondary | ICD-10-CM

## 2017-04-21 DIAGNOSIS — M6281 Muscle weakness (generalized): Secondary | ICD-10-CM

## 2017-04-21 NOTE — Therapy (Signed)
[facility] [place], [place], [address] Phone: [phone]   Fax:  [phone]  Physical Therapy Treatment  Patient Details  Name: Jonathan Harvey MRN: [id number] Date of Birth: 07-08-1945 Referring Provider: [name] MD.   Encounter Date: 04/21/2017  PT End of Session - 04/21/17 1130    Visit Number  11    Number of Visits  16    Date for PT Re-Evaluation  05/12/17    Authorization Type  FOTO.Marland Kitchenat least every 5th visit.    PT Start Time  1034    PT Stop Time  1115    PT Time Calculation (min)  41 min    Activity Tolerance  Patient tolerated treatment well    Behavior During Therapy  WFL for tasks assessed/performed       Past Medical History:  Diagnosis Date  . Arthritis    "all over" (12/25/2016)  . Chronic lower back pain    "severe" (12/25/2016)  . Hepatitis B 2008  . History of kidney stones   . Hypertension   . Hypertriglyceridemia   . Prostate cancer ([place]) 10/2006   s/p robotic prostatectomy   . Renal cell carcinoma, right ([place])   . Spinal stenosis     Past Surgical History:  Procedure Laterality Date  . CARPAL TUNNEL RELEASE Right 2010  . CYST EXCISION Left    elbow  . FEMUR SURGERY Left ~ 1962   "femur was growing crooked; broke it to straighten it out; body cast for ~ 4 months"  . FINGER SURGERY Right 1987   right thumb and index finger (pt denies this hx on 12/25/2016)  . FRACTURE SURGERY    . INGUINAL HERNIA REPAIR Left 2008  . JOINT REPLACEMENT    . MASTOIDECTOMY  1982  . ORIF GREATER TROCHANTERIC FRACTURE Left 12/25/2016    ACETABULAR REVISION/notes 12/25/2016  . PROSTATECTOMY  10/2006  . ROBOTIC ASSITED PARTIAL NEPHRECTOMY Right 01/30/2017   Procedure: ROBOTIC ASSITED PARTIAL NEPHRECTOMY;  Surgeon: Ardis Hughs, MD;  [facility];  Service: Urology;  Laterality: Right;  . TOTAL HIP ARTHROPLASTY Left 2002  . TOTAL HIP REVISION Left 04/24/2016   Procedure: TOTAL HIP REVISION;   Surgeon: [name], MD;  Location: [place];  Service: Orthopedics;  Laterality: Left;  . TOTAL HIP REVISION Left 12/25/2016   Procedure: OPEN REDUCTION INTERNAL FIXATION GREATER TROCHANTERIC FRACTURE, ACETABULAR REVISION;  Surgeon: [name], MD;  Location: [place];  Service: Orthopedics;  Laterality: Left;  . TOTAL KNEE ARTHROPLASTY Right 08/26/2016   Procedure: RIGHT TOTAL KNEE ARTHROPLASTY;  Surgeon: [name], MD;  Location: [place];  Service: Orthopedics;  Laterality: Right;    There were no vitals filed for this visit.  Subjective Assessment - 04/21/17 1141    Subjective  No complaints upon arrival.    Pertinent History  3 left hip surgeries; right TKA.    How long can you stand comfortably?  10 minutes.    How long can you walk comfortably?  Short distances in community.    Patient Stated Goals  Walk and get around with no pain.    Currently in Pain?  Other (Comment) No pain assessment provided by patient         [facility] PT Assessment - 04/21/17 0001      Assessment   Medical Diagnosis  Left hip replacement.    Onset Date/Surgical Date  12/25/16    Next MD Visit  05/2017      Precautions  Precaution Comments  Patient remains mindful of hip precautions.      Restrictions   Weight Bearing Restrictions  No                  [facility] Adult PT Treatment/Exercise - 04/21/17 0001      Knee/Hip Exercises: Aerobic   Nustep  L7 x15 min      Knee/Hip Exercises: Machines for Strengthening   Cybex Knee Extension  10# 3x10 reps    Cybex Knee Flexion  30# 3x10 reps    Cybex Leg Press  2 pl, seat 6 x30 reps      Knee/Hip Exercises: Standing   Forward Step Up  Left;3 sets;10 reps;Hand Hold: 2;Step Height: 6"    Step Down  Left;20 reps;Hand Hold: 2;Step Height: 4"               PT Short Term Goals - 04/18/17 1128      PT SHORT TERM GOAL #1   Title  Independent with an initial HEP.    Time  2    Period  Weeks    Status  Achieved via verbal instructions for  exercise        PT Long Term Goals - 03/24/17 1113      PT LONG TERM GOAL #1   Title  Independent with an advanced HEP.    Time  8    Period  Weeks    Status  On-going      PT LONG TERM GOAL #2   Title  Right hip abduction to a solid 4/5 to increase stability for walking and functional actiivtes.    Time  8    Period  Weeks    Status  On-going      PT LONG TERM GOAL #3   Title  Perform ADL's with pain not > 3/10.    Time  8    Period  Weeks    Status  On-going      PT LONG TERM GOAL #4   Title  Perform a reciprocating stair gait with one railing with pain not > 2-3/10.    Time  8    Period  Weeks    Status  On-going      PT LONG TERM GOAL #5   Title  Walk a community distance with pain not > 3/10.    Time  8    Period  Weeks    Status  On-going            Plan - 04/21/17 1136    Clinical Impression Statement  Patient tolerated today's treatment well with no complaints of pain during treatment. Increased resistance utilized with leg press as well as continued step up activities to encourage increased functional strenghtening. Electrical stimulation attempted but unable secondary to equipment dysfunction.    Rehab Potential  Good    PT Frequency  2x / week    PT Duration  8 weeks    PT Treatment/Interventions  ADLs/Self Care Home Management;Cryotherapy;Electrical Stimulation;Gait training;Stair training;Functional mobility training;Therapeutic activities;Therapeutic exercise;Neuromuscular re-education;Patient/family education;Manual techniques    PT Next Visit Plan  Continue Plan of care to improve strength, ROM, and overall function.    Consulted and Agree with Plan of Care  Patient       Patient will benefit from skilled therapeutic intervention in order to improve the following deficits and impairments:  Abnormal gait, Decreased activity tolerance, Decreased range of motion, Decreased strength, Pain  Visit Diagnosis: Pain in left  hip  Muscle weakness  (generalized)     Problem List Patient Active Problem List   Diagnosis Date Noted  . Renal cell carcinoma of right kidney ([place]) 02/01/2017  . Postoperative anemia due to acute blood loss 02/01/2017  . Renal mass 01/30/2017  . 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 Primary osteoarthritis of left hip 12/25/2016  . Primary localized osteoarthritis of right knee 08/26/2016  . Primary osteoarthritis of right knee 08/25/2016  . Status post revision of total hip 04/24/2016  . ED (erectile dysfunction) of organic origin 07/01/2012  . Malignant neoplasm of prostate ([place]) 07/01/2012  . Microscopic hematuria 07/01/2012  . Cervical stenosis of spinal canal 01/10/2012    Standley Brooking, PTA 04/21/2017, 11:42 AM  [facility] [address] [place], [place], [address] Phone: [phone]   Fax:  [phone]  Name: Jonathan Harvey MRN: [id number] Date of Birth: 08/20/1945

## 2017-04-24 ENCOUNTER — Ambulatory Visit: Payer: Medicare Other | Admitting: Physical Therapy

## 2017-04-24 ENCOUNTER — Encounter: Payer: Self-pay | Admitting: Physical Therapy

## 2017-04-24 DIAGNOSIS — M25552 Pain in left hip: Secondary | ICD-10-CM | POA: Diagnosis not present

## 2017-04-24 DIAGNOSIS — M6281 Muscle weakness (generalized): Secondary | ICD-10-CM

## 2017-04-24 NOTE — Therapy (Signed)
[facility] [place], [place], [address] Phone: [phone]   Fax:  [phone]  Physical Therapy Treatment  Patient Details  Name: Jonathan Harvey MRN: [id number] Date of Birth: 23-Nov-1945 Referring Provider: [name] MD.   Encounter Date: 04/24/2017  PT End of Session - 04/24/17 1112    Visit Number  12    Number of Visits  16    Date for PT Re-Evaluation  05/12/17    Authorization Type  FOTO.Marland Kitchenat least every 5th visit.    PT Start Time  1031    PT Stop Time  1130    PT Time Calculation (min)  59 min    Activity Tolerance  Patient tolerated treatment well    Behavior During Therapy  WFL for tasks assessed/performed       Past Medical History:  Diagnosis Date  . Arthritis    "all over" (12/25/2016)  . Chronic lower back pain    "severe" (12/25/2016)  . Hepatitis B 2008  . History of kidney stones   . Hypertension   . Hypertriglyceridemia   . Prostate cancer ([place]) 10/2006   s/p robotic prostatectomy   . Renal cell carcinoma, right ([place])   . Spinal stenosis     Past Surgical History:  Procedure Laterality Date  . CARPAL TUNNEL RELEASE Right 2010  . CYST EXCISION Left    elbow  . FEMUR SURGERY Left ~ 1962   "femur was growing crooked; broke it to straighten it out; body cast for ~ 4 months"  . FINGER SURGERY Right 1987   right thumb and index finger (pt denies this hx on 12/25/2016)  . FRACTURE SURGERY    . INGUINAL HERNIA REPAIR Left 2008  . JOINT REPLACEMENT    . MASTOIDECTOMY  1982  . ORIF GREATER TROCHANTERIC FRACTURE Left 12/25/2016    ACETABULAR REVISION/notes 12/25/2016  . PROSTATECTOMY  10/2006  . ROBOTIC ASSITED PARTIAL NEPHRECTOMY Right 01/30/2017   Procedure: ROBOTIC ASSITED PARTIAL NEPHRECTOMY;  Surgeon: Ardis Hughs, MD;  [facility]: [facility];  Service: Urology;  Laterality: Right;  . TOTAL HIP ARTHROPLASTY Left 2002  . TOTAL HIP REVISION Left 04/24/2016   Procedure: TOTAL HIP REVISION;   Surgeon: [name], MD;  Location: [place];  Service: Orthopedics;  Laterality: Left;  . TOTAL HIP REVISION Left 12/25/2016   Procedure: OPEN REDUCTION INTERNAL FIXATION GREATER TROCHANTERIC FRACTURE, ACETABULAR REVISION;  Surgeon: [name], MD;  Location: [place];  Service: Orthopedics;  Laterality: Left;  . TOTAL KNEE ARTHROPLASTY Right 08/26/2016   Procedure: RIGHT TOTAL KNEE ARTHROPLASTY;  Surgeon: [name], MD;  Location: [place];  Service: Orthopedics;  Laterality: Right;    There were no vitals filed for this visit.  Subjective Assessment - 04/24/17 1036    Subjective  Patient arrived and reported doing very well past few days and did not use cane and then woke up this AM with pain and difficulty walking on left LE for unknown reaseon, and some improvement walking in today for unknow reason    Pertinent History  3 left hip surgeries; right TKA.    How long can you stand comfortably?  10 minutes.    How long can you walk comfortably?  Short distances in community.    Patient Stated Goals  Walk and get around with no pain.    Currently in Pain?  Yes    Pain Score  1     Pain Location  Hip    Pain Orientation  Left    Pain Descriptors / Indicators  Discomfort    Pain Type  Chronic pain    Pain Onset  More than a month ago    Pain Frequency  Intermittent    Aggravating Factors   prolong one position    Pain Relieving Factors  meds                      [facility] Adult PT Treatment/Exercise - 04/24/17 0001      Knee/Hip Exercises: Aerobic   Nustep  L7 x15 min      Knee/Hip Exercises: Machines for Strengthening   Cybex Leg Press  2 pl, seat 6 x30 reps      Knee/Hip Exercises: Standing   Hip Abduction  Stengthening;Left;20 reps    Abduction Limitations  red t-band    Forward Step Up  Left;3 sets;10 reps;Hand Hold: 2;Step Height: 6"    Step Down  Left;20 reps;Hand Hold: 2;Step Height: 4"      Knee/Hip Exercises: Supine   Other Supine Knee/Hip Exercises  Supine  hip abduction, knees bent, green theraband, 3x10 with LT LE only      Electrical Stimulation   Electrical Stimulation Location  L lateral hip    Electrical Stimulation Action  premod    Electrical Stimulation Parameters  80-150hz  x110min    Electrical Stimulation Goals  Pain               PT Short Term Goals - 04/18/17 1128      PT SHORT TERM GOAL #1   Title  Independent with an initial HEP.    Time  2    Period  Weeks    Status  Achieved via verbal instructions for exercise        PT Long Term Goals - 03/24/17 1113      PT LONG TERM GOAL #1   Title  Independent with an advanced HEP.    Time  8    Period  Weeks    Status  On-going      PT LONG TERM GOAL #2   Title  Right hip abduction to a solid 4/5 to increase stability for walking and functional actiivtes.    Time  8    Period  Weeks    Status  On-going      PT LONG TERM GOAL #3   Title  Perform ADL's with pain not > 3/10.    Time  8    Period  Weeks    Status  On-going      PT LONG TERM GOAL #4   Title  Perform a reciprocating stair gait with one railing with pain not > 2-3/10.    Time  8    Period  Weeks    Status  On-going      PT LONG TERM GOAL #5   Title  Walk a community distance with pain not > 3/10.    Time  8    Period  Weeks    Status  On-going            Plan - 04/24/17 1113    Clinical Impression Statement  Patient tolerated treatment well today. Patient progressing with left LE strengthening. Patient able to complete all exercises with no discomfort. Patient unsure of increased discomfort. Current goals ongoing.     Rehab Potential  Good    PT Frequency  2x / week    PT Duration  8 weeks  PT Treatment/Interventions  ADLs/Self Care Home Management;Cryotherapy;Electrical Stimulation;Gait training;Stair training;Functional mobility training;Therapeutic activities;Therapeutic exercise;Neuromuscular re-education;Patient/family education;Manual techniques    PT Next Visit Plan   Continue Plan of care to improve strength, ROM, and overall function.    Consulted and Agree with Plan of Care  Patient       Patient will benefit from skilled therapeutic intervention in order to improve the following deficits and impairments:  Abnormal gait, Decreased activity tolerance, Decreased range of motion, Decreased strength, Pain  Visit Diagnosis: Pain in left hip  Muscle weakness (generalized)     Problem List Patient Active Problem List   Diagnosis Date Noted  . Renal cell carcinoma of right kidney ([place]) 02/01/2017  . Postoperative anemia due to acute blood loss 02/01/2017  . Renal mass 01/30/2017  . 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 Primary osteoarthritis of left hip 12/25/2016  . Primary localized osteoarthritis of right knee 08/26/2016  . Primary osteoarthritis of right knee 08/25/2016  . Status post revision of total hip 04/24/2016  . ED (erectile dysfunction) of organic origin 07/01/2012  . Malignant neoplasm of prostate ([place]) 07/01/2012  . Microscopic hematuria 07/01/2012  . Cervical stenosis of spinal canal 01/10/2012    Norvel Wenker P, PTA 04/24/2017, 11:43 AM  [facility] [place], [place], [address] Phone: [phone]   Fax:  [phone]  Name: Jonathan Harvey MRN: [id number] Date of Birth: [birth year]

## 2017-04-28 ENCOUNTER — Ambulatory Visit: Payer: Medicare Other | Admitting: Physical Therapy

## 2017-04-28 ENCOUNTER — Encounter: Payer: Self-pay | Admitting: Physical Therapy

## 2017-04-28 DIAGNOSIS — M6281 Muscle weakness (generalized): Secondary | ICD-10-CM

## 2017-04-28 DIAGNOSIS — M25552 Pain in left hip: Secondary | ICD-10-CM

## 2017-04-28 NOTE — Therapy (Signed)
[facility] [place], [place], [address] Phone: [phone]   Fax:  [phone]  Physical Therapy Treatment  Patient Details  Name: Jonathan Harvey MRN: [id number] Date of Birth: Sep 29, 1945 Referring Provider: [name] MD.   Encounter Date: 04/28/2017  PT End of Session - 04/28/17 1148    Visit Number  13    Number of Visits  16    Date for PT Re-Evaluation  05/12/17    Authorization Type  FOTO.Marland Kitchenat least every 5th visit.    PT Start Time  1115    PT Stop Time  1159    PT Time Calculation (min)  44 min    Activity Tolerance  Patient tolerated treatment well    Behavior During Therapy  WFL for tasks assessed/performed       Past Medical History:  Diagnosis Date  . Arthritis    "all over" (12/25/2016)  . Chronic lower back pain    "severe" (12/25/2016)  . Hepatitis B 2008  . History of kidney stones   . Hypertension   . Hypertriglyceridemia   . Prostate cancer ([place]) 10/2006   s/p robotic prostatectomy   . Renal cell carcinoma, right ([place])   . Spinal stenosis     Past Surgical History:  Procedure Laterality Date  . CARPAL TUNNEL RELEASE Right 2010  . CYST EXCISION Left    elbow  . FEMUR SURGERY Left ~ 1962   "femur was growing crooked; broke it to straighten it out; body cast for ~ 4 months"  . FINGER SURGERY Right 1987   right thumb and index finger (pt denies this hx on 12/25/2016)  . FRACTURE SURGERY    . INGUINAL HERNIA REPAIR Left 2008  . JOINT REPLACEMENT    . MASTOIDECTOMY  1982  . ORIF GREATER TROCHANTERIC FRACTURE Left 12/25/2016    ACETABULAR REVISION/notes 12/25/2016  . PROSTATECTOMY  10/2006  . ROBOTIC ASSITED PARTIAL NEPHRECTOMY Right 01/30/2017   Procedure: ROBOTIC ASSITED PARTIAL NEPHRECTOMY;  Surgeon: Ardis Hughs, MD;  [facility];  Service: Urology;  Laterality: Right;  . TOTAL HIP ARTHROPLASTY Left 2002  . TOTAL HIP REVISION Left 04/24/2016   Procedure: TOTAL HIP REVISION;   Surgeon: [name], MD;  Location: [place];  Service: Orthopedics;  Laterality: Left;  . TOTAL HIP REVISION Left 12/25/2016   Procedure: OPEN REDUCTION INTERNAL FIXATION GREATER TROCHANTERIC FRACTURE, ACETABULAR REVISION;  Surgeon: [name], MD;  Location: [place];  Service: Orthopedics;  Laterality: Left;  . TOTAL KNEE ARTHROPLASTY Right 08/26/2016   Procedure: RIGHT TOTAL KNEE ARTHROPLASTY;  Surgeon: [name], MD;  Location: [place];  Service: Orthopedics;  Laterality: Right;    There were no vitals filed for this visit.  Subjective Assessment - 04/28/17 1125    Subjective  No new complaints upon arrival ongoing discomfort around 5/10 at most at 1/10 with rest    Pertinent History  3 left hip surgeries; right TKA.    How long can you stand comfortably?  10 minutes.    How long can you walk comfortably?  Short distances in community.    Patient Stated Goals  Walk and get around with no pain.    Currently in Pain?  Yes    Pain Score  1     Pain Location  Hip    Pain Orientation  Left    Pain Descriptors / Indicators  Discomfort    Pain Type  Chronic pain    Pain Onset  More  than a month ago    Pain Frequency  Intermittent    Aggravating Factors   any prolong position    Pain Relieving Factors  meds                      [facility] Adult PT Treatment/Exercise - 04/28/17 0001      Knee/Hip Exercises: Aerobic   Nustep  L7 x15 min      Knee/Hip Exercises: Machines for Strengthening   Cybex Knee Extension  10# 3x10 reps    Cybex Knee Flexion  30# 3x10 reps    Cybex Leg Press  2 pl, seat 6 x30 reps      Knee/Hip Exercises: Standing   Hip Abduction  Stengthening;Left;20 reps    Abduction Limitations  red t-band      Electrical Stimulation   Electrical Stimulation Location  L lateral hip    Electrical Stimulation Action  premod    Electrical Stimulation Parameters  80-'150hz'  x44mn    Electrical Stimulation Goals  Pain             PT Education - 04/28/17 1156     Education provided  Yes    Education Details  HEP     Person(s) Educated  Patient    Methods  Explanation;Demonstration;Handout    Comprehension  Verbalized understanding;Returned demonstration       PT Short Term Goals - 04/18/17 1128      PT SHORT TERM GOAL #1   Title  Independent with an initial HEP.    Time  2    Period  Weeks    Status  Achieved via verbal instructions for exercise        PT Long Term Goals - 04/28/17 1149      PT LONG TERM GOAL #1   Title  Independent with an advanced HEP.    Period  Weeks    Status  Achieved      PT LONG TERM GOAL #2   Title  Right hip abduction to a solid 4/5 to increase stability for walking and functional actiivtes.    Time  8    Period  Weeks    Status  On-going      PT LONG TERM GOAL #3   Title  Perform ADL's with pain not > 3/10.    Time  8    Period  Weeks    Status  Achieved 04/28/17      PT LONG TERM GOAL #4   Title  Perform a reciprocating stair gait with one railing with pain not > 2-3/10.    Time  8    Period  Weeks    Status  On-going      PT LONG TERM GOAL #5   Title  Walk a community distance with pain not > 3/10.    Time  8    Period  Weeks    Status  On-going 3-4/10 04/28/17            Plan - 04/28/17 1156    Clinical Impression Statement  Patient tolerated treatment well today. Patient progressing with overall strengthening activities and has reported decreased discomfort. Patient given HEP for home progression. Patient able to perform ADL's with greater ease and no more than 3/10 pain. Patient has some diffficulty with descending stairs or prolong walking such as a community distance. Patient met LTG #1 and #3 today, others ongoing due to pain and strength limitations.     Rehab  Potential  Good    PT Frequency  2x / week    PT Duration  8 weeks    PT Treatment/Interventions  ADLs/Self Care Home Management;Cryotherapy;Electrical Stimulation;Gait training;Stair training;Functional mobility  training;Therapeutic activities;Therapeutic exercise;Neuromuscular re-education;Patient/family education;Manual techniques    PT Next Visit Plan  Continue Plan of care to improve strength, ROM, and overall function.    Consulted and Agree with Plan of Care  Patient       Patient will benefit from skilled therapeutic intervention in order to improve the following deficits and impairments:  Abnormal gait, Decreased activity tolerance, Decreased range of motion, Decreased strength, Pain  Visit Diagnosis: Pain in left hip  Muscle weakness (generalized)     Problem List Patient Active Problem List   Diagnosis Date Noted  . Renal cell carcinoma of right kidney ([place]) 02/01/2017  . Postoperative anemia due to acute blood loss 02/01/2017  . Renal mass 01/30/2017  . 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 Primary osteoarthritis of left hip 12/25/2016  . Primary localized osteoarthritis of right knee 08/26/2016  . Primary osteoarthritis of right knee 08/25/2016  . Status post revision of total hip 04/24/2016  . ED (erectile dysfunction) of organic origin 07/01/2012  . Malignant neoplasm of prostate ([place]) 07/01/2012  . Microscopic hematuria 07/01/2012  . Cervical stenosis of spinal canal 01/10/2012    Hershy Flenner P, PTA 04/28/2017, 12:02 PM  [facility] [place], [place], [address] Phone: [phone]   Fax:  [phone]  Name: Jonathan Harvey MRN: [id number] Date of Birth: 08-Aug-1945

## 2017-04-28 NOTE — Patient Instructions (Addendum)
  Toe-Up (Ankle Plantar Flexion and Dorsiflexion)   Holding a stable object, rise up on toes. Hold ____ seconds. Then rock back on heels and Hold 2____ seconds. Repeat _5-10___ times. Do _1-2___ sessions per day.   High Stepping   Using support, lift knees, taking high steps. Repeat __5-10__ times. Do __1-2__ sessions per day.     Bridging   Slowly raise buttocks from floor, keeping stomach tight. Repeat _10___ times per set. Do __2__ sets per session. Do __2__ sessions per day.      Strengthening: Hip Abduction (Side-Lying)    Tighten muscles on front of left thigh, then lift leg __5__ inches from surface, keeping knee locked.  Repeat _10___ times per set. Do ___2_ sets per session. Do __2__ sessions per day.   Strengthening: Straight Leg Raise (Phase 1)    Tighten muscles on front of right thigh, then lift leg ____ inches from surface, keeping knee locked.  Repeat _10___ times per set. Do _2___ sets per session. Do __2__ sessions per day.    Strengthening: Hip Abduction - Resisted    With tubing around right leg, other side toward anchor, extend leg out from side. Repeat _10___ times per set. Do __2__ sets per session. Do __1__ sessions per day.

## 2017-05-01 ENCOUNTER — Encounter: Payer: Self-pay | Admitting: Physical Therapy

## 2017-05-01 ENCOUNTER — Ambulatory Visit: Payer: Medicare Other | Admitting: Physical Therapy

## 2017-05-01 DIAGNOSIS — M25552 Pain in left hip: Secondary | ICD-10-CM | POA: Diagnosis not present

## 2017-05-01 DIAGNOSIS — M6281 Muscle weakness (generalized): Secondary | ICD-10-CM

## 2017-05-01 NOTE — Therapy (Signed)
[facility] [place], [place], [address] Phone: [phone]   Fax:  [phone]  Physical Therapy Treatment  Patient Details  Name: Jonathan Harvey MRN: [id number] Date of Birth: 06/02/1945 Referring Provider: [name] MD.   Encounter Date: 05/01/2017  PT End of Session - 05/01/17 1051    Visit Number  14    Number of Visits  16    Date for PT Re-Evaluation  05/12/17    Authorization Type  FOTO.Marland Kitchenat least every 5th visit.    PT Start Time  1033    PT Stop Time  1128    PT Time Calculation (min)  55 min    Activity Tolerance  Patient tolerated treatment well    Behavior During Therapy  WFL for tasks assessed/performed       Past Medical History:  Diagnosis Date  . Arthritis    "all over" (12/25/2016)  . Chronic lower back pain    "severe" (12/25/2016)  . Hepatitis B 2008  . History of kidney stones   . Hypertension   . Hypertriglyceridemia   . Prostate cancer ([place]) 10/2006   s/p robotic prostatectomy   . Renal cell carcinoma, right ([place])   . Spinal stenosis     Past Surgical History:  Procedure Laterality Date  . CARPAL TUNNEL RELEASE Right 2010  . CYST EXCISION Left    elbow  . FEMUR SURGERY Left ~ 1962   "femur was growing crooked; broke it to straighten it out; body cast for ~ 4 months"  . FINGER SURGERY Right 1987   right thumb and index finger (pt denies this hx on 12/25/2016)  . FRACTURE SURGERY    . INGUINAL HERNIA REPAIR Left 2008  . JOINT REPLACEMENT    . MASTOIDECTOMY  1982  . ORIF GREATER TROCHANTERIC FRACTURE Left 12/25/2016    ACETABULAR REVISION/notes 12/25/2016  . PROSTATECTOMY  10/2006  . ROBOTIC ASSITED PARTIAL NEPHRECTOMY Right 01/30/2017   Procedure: ROBOTIC ASSITED PARTIAL NEPHRECTOMY;  Surgeon: Ardis Hughs, MD;  [facility];  Service: Urology;  Laterality: Right;  . TOTAL HIP ARTHROPLASTY Left 2002  . TOTAL HIP REVISION Left 04/24/2016   Procedure: TOTAL HIP REVISION;   Surgeon: [name], MD;  Location: [place];  Service: Orthopedics;  Laterality: Left;  . TOTAL HIP REVISION Left 12/25/2016   Procedure: OPEN REDUCTION INTERNAL FIXATION GREATER TROCHANTERIC FRACTURE, ACETABULAR REVISION;  Surgeon: [name], MD;  Location: [place];  Service: Orthopedics;  Laterality: Left;  . TOTAL KNEE ARTHROPLASTY Right 08/26/2016   Procedure: RIGHT TOTAL KNEE ARTHROPLASTY;  Surgeon: [name], MD;  Location: [place];  Service: Orthopedics;  Laterality: Right;    There were no vitals filed for this visit.  Subjective Assessment - 05/01/17 1046    Subjective  Reports that his hip is good today but his back is not. Back pain now descending to knee region.    Pertinent History  3 left hip surgeries; right TKA.    How long can you stand comfortably?  10 minutes.    How long can you walk comfortably?  Short distances in community.    Patient Stated Goals  Walk and get around with no pain.    Currently in Pain?  Yes    Pain Score  4     Pain Location  Hip    Pain Orientation  Left    Pain Descriptors / Indicators  Discomfort    Pain Type  Chronic pain  Pain Onset  More than a month ago         [facility] PT Assessment - 05/01/17 0001      Assessment   Medical Diagnosis  Left hip replacement.    Onset Date/Surgical Date  12/25/16    Next MD Visit  05/2017      Precautions   Precaution Comments  Patient remains mindful of hip precautions.      Restrictions   Weight Bearing Restrictions  No                  [facility] Adult PT Treatment/Exercise - 05/01/17 0001      Knee/Hip Exercises: Aerobic   Nustep  L7 x15 min      Knee/Hip Exercises: Machines for Strengthening   Cybex Knee Extension  10# 3x10 reps    Cybex Knee Flexion  40# 5x10 reps    Cybex Leg Press  2 pl, seat 6 x30 reps with red theraband clam      Knee/Hip Exercises: Seated   Sit to Sand  15 reps;with UE support      Modalities   Modalities  Electrical Stimulation      Electrical  Stimulation   Electrical Stimulation Location  L lateral hip    Electrical Stimulation Action  Pre-Mod    Electrical Stimulation Parameters  80-150 hz x15 min    Electrical Stimulation Goals  Pain               PT Short Term Goals - 04/18/17 1128      PT SHORT TERM GOAL #1   Title  Independent with an initial HEP.    Time  2    Period  Weeks    Status  Achieved via verbal instructions for exercise        PT Long Term Goals - 04/28/17 1149      PT LONG TERM GOAL #1   Title  Independent with an advanced HEP.    Period  Weeks    Status  Achieved      PT LONG TERM GOAL #2   Title  Right hip abduction to a solid 4/5 to increase stability for walking and functional actiivtes.    Time  8    Period  Weeks    Status  On-going      PT LONG TERM GOAL #3   Title  Perform ADL's with pain not > 3/10.    Time  8    Period  Weeks    Status  Achieved 04/28/17      PT LONG TERM GOAL #4   Title  Perform a reciprocating stair gait with one railing with pain not > 2-3/10.    Time  8    Period  Weeks    Status  On-going      PT LONG TERM GOAL #5   Title  Walk a community distance with pain not > 3/10.    Time  8    Period  Weeks    Status  On-going 3-4/10 04/28/17            Plan - 05/01/17 1424    Clinical Impression Statement  Patient tolerated today's treatment well with greatest complaint being of his low back today and unable to fully discern low back and hip pain. Patient continued with machine strengthening and functional strengthening. Red theraband added to leg press to emphasize hip abductors. Greatest emphasis on controlled and purposeful movements with exercises. Patient remains compliant  with HEP. Normal modalities response noted following removal of the modalities.    Rehab Potential  Good    PT Frequency  2x / week    PT Duration  8 weeks    PT Treatment/Interventions  ADLs/Self Care Home Management;Cryotherapy;Electrical Stimulation;Gait training;Stair  training;Functional mobility training;Therapeutic activities;Therapeutic exercise;Neuromuscular re-education;Patient/family education;Manual techniques    PT Next Visit Plan  Continue Plan of care to improve strength, ROM, and overall function.    Consulted and Agree with Plan of Care  Patient       Patient will benefit from skilled therapeutic intervention in order to improve the following deficits and impairments:  Abnormal gait, Decreased activity tolerance, Decreased range of motion, Decreased strength, Pain  Visit Diagnosis: Pain in left hip  Muscle weakness (generalized)     Problem List Patient Active Problem List   Diagnosis Date Noted  . Renal cell carcinoma of right kidney ([place]) 02/01/2017  . Postoperative anemia due to acute blood loss 02/01/2017  . Renal mass 01/30/2017  . 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 Primary osteoarthritis of left hip 12/25/2016  . Primary localized osteoarthritis of right knee 08/26/2016  . Primary osteoarthritis of right knee 08/25/2016  . Status post revision of total hip 04/24/2016  . ED (erectile dysfunction) of organic origin 07/01/2012  . Malignant neoplasm of prostate ([place]) 07/01/2012  . Microscopic hematuria 07/01/2012  . Cervical stenosis of spinal canal 01/10/2012    Standley Brooking, PTA 05/01/2017, 2:52 PM  [facility] [address] [place], [place], [address] Phone: [phone]   Fax:  [phone]  Name: Jonathan Harvey MRN: [id number] Date of Birth: 07-Nov-1945

## 2017-05-05 ENCOUNTER — Ambulatory Visit: Payer: Medicare Other | Attending: Orthopedic Surgery | Admitting: Physical Therapy

## 2017-05-05 ENCOUNTER — Encounter: Payer: Self-pay | Admitting: Physical Therapy

## 2017-05-05 DIAGNOSIS — M6281 Muscle weakness (generalized): Secondary | ICD-10-CM | POA: Diagnosis present

## 2017-05-05 DIAGNOSIS — M25552 Pain in left hip: Secondary | ICD-10-CM | POA: Insufficient documentation

## 2017-05-05 NOTE — Therapy (Signed)
[facility] [place], [place], [address] Phone: [phone]   Fax:  [phone]  Physical Therapy Treatment  Patient Details  Name: Jonathan Harvey MRN: [id number] Date of Birth: Apr 18, 1945 Referring Provider: [name] MD.   Encounter Date: 05/05/2017  PT End of Session - 05/05/17 1344    Visit Number  15    Number of Visits  16    Date for PT Re-Evaluation  05/12/17    PT Start Time  1301    PT Stop Time  1358    PT Time Calculation (min)  57 min    Activity Tolerance  Patient tolerated treatment well    Behavior During Therapy  [facility] for tasks assessed/performed       Past Medical History:  Diagnosis Date  . Arthritis    "all over" (12/25/2016)  . Chronic lower back pain    "severe" (12/25/2016)  . Hepatitis B 2008  . History of kidney stones   . Hypertension   . Hypertriglyceridemia   . Prostate cancer ([place]) 10/2006   s/p robotic prostatectomy   . Renal cell carcinoma, right ([place])   . Spinal stenosis     Past Surgical History:  Procedure Laterality Date  . CARPAL TUNNEL RELEASE Right 2010  . CYST EXCISION Left    elbow  . FEMUR SURGERY Left ~ 1962   "femur was growing crooked; broke it to straighten it out; body cast for ~ 4 months"  . FINGER SURGERY Right 1987   right thumb and index finger (pt denies this hx on 12/25/2016)  . FRACTURE SURGERY    . INGUINAL HERNIA REPAIR Left 2008  . JOINT REPLACEMENT    . MASTOIDECTOMY  1982  . ORIF GREATER TROCHANTERIC FRACTURE Left 12/25/2016    ACETABULAR REVISION/notes 12/25/2016  . PROSTATECTOMY  10/2006  . ROBOTIC ASSITED PARTIAL NEPHRECTOMY Right 01/30/2017   Procedure: ROBOTIC ASSITED PARTIAL NEPHRECTOMY;  Surgeon: Ardis Hughs, MD;  Location: [facility];  Service: Urology;  Laterality: Right;  . TOTAL HIP ARTHROPLASTY Left 2002  . TOTAL HIP REVISION Left 04/24/2016   Procedure: TOTAL HIP REVISION;  Surgeon: [name], MD;  Location: [place];  Service:  Orthopedics;  Laterality: Left;  . TOTAL HIP REVISION Left 12/25/2016   Procedure: OPEN REDUCTION INTERNAL FIXATION GREATER TROCHANTERIC FRACTURE, ACETABULAR REVISION;  Surgeon: [name], MD;  Location: [place];  Service: Orthopedics;  Laterality: Left;  . TOTAL KNEE ARTHROPLASTY Right 08/26/2016   Procedure: RIGHT TOTAL KNEE ARTHROPLASTY;  Surgeon: [name], MD;  Location: [place];  Service: Orthopedics;  Laterality: Right;    There were no vitals filed for this visit.  Subjective Assessment - 05/05/17 1303    Subjective  Patient arrived with reprted having a "really great" day yesterday and "ok" today    Pertinent History  3 left hip surgeries; right TKA.    How long can you stand comfortably?  10 minutes.    How long can you walk comfortably?  Short distances in community.    Patient Stated Goals  Walk and get around with no pain.    Currently in Pain?  Yes    Pain Score  3     Pain Location  Hip    Pain Orientation  Left    Pain Descriptors / Indicators  Discomfort    Pain Type  Chronic pain    Pain Onset  More than a month ago    Pain Frequency  Intermittent  Aggravating Factors   any one prolong position    Pain Relieving Factors  medication and rest                      [facility] Adult PT Treatment/Exercise - 05/05/17 0001      Knee/Hip Exercises: Aerobic   Nustep  L7 x15 min      Knee/Hip Exercises: Machines for Strengthening   Cybex Knee Extension  20# 3x10    Cybex Knee Flexion  40# 2x15    Cybex Leg Press  2 pl, seat 6 x30 reps      Knee/Hip Exercises: Standing   Hip Abduction  Stengthening;Left;20 reps;Knee straight;Limitations    Abduction Limitations  10#    Hip Extension  Stengthening;Left;20 reps;Knee straight;Limitations    Extension Limitations  10#      Knee/Hip Exercises: Seated   Sit to Sand  without UE support;15 reps cues for slow pace      Knee/Hip Exercises: Supine   Bridges with Ball Squeeze  20 reps;Strengthening;Both       Acupuncturist Location  L lateral hip    Electrical Stimulation Action  premod    Electrical Stimulation Parameters  80-150hz  x58min    Electrical Stimulation Goals  Pain               PT Short Term Goals - 04/18/17 1128      PT SHORT TERM GOAL #1   Title  Independent with an initial HEP.    Time  2    Period  Weeks    Status  Achieved via verbal instructions for exercise        PT Long Term Goals - 04/28/17 1149      PT LONG TERM GOAL #1   Title  Independent with an advanced HEP.    Period  Weeks    Status  Achieved      PT LONG TERM GOAL #2   Title  Right hip abduction to a solid 4/5 to increase stability for walking and functional actiivtes.    Time  8    Period  Weeks    Status  On-going      PT LONG TERM GOAL #3   Title  Perform ADL's with pain not > 3/10.    Time  8    Period  Weeks    Status  Achieved 04/28/17      PT LONG TERM GOAL #4   Title  Perform a reciprocating stair gait with one railing with pain not > 2-3/10.    Time  8    Period  Weeks    Status  On-going      PT LONG TERM GOAL #5   Title  Walk a community distance with pain not > 3/10.    Time  8    Period  Weeks    Status  On-going 3-4/10 04/28/17            Plan - 05/05/17 1345    Clinical Impression Statement  Patient tolerated treatment well today. Patient reported having more "better days" Patient able to progress exercises today for left LE strengthing. Patient feels overall progress and able to complete ADL's with greater ease. Goals progressing. Normal response to modalities.     Rehab Potential  Good    PT Frequency  2x / week    PT Duration  8 weeks    PT Treatment/Interventions  ADLs/Self Care Home Management;Cryotherapy;Dealer  Stimulation;Gait training;Stair training;Functional mobility training;Therapeutic activities;Therapeutic exercise;Neuromuscular re-education;Patient/family education;Manual techniques    PT Next Visit Plan   Continue Plan of care to improve strength, ROM, and overall function.    Consulted and Agree with Plan of Care  Patient       Patient will benefit from skilled therapeutic intervention in order to improve the following deficits and impairments:  Abnormal gait, Decreased activity tolerance, Decreased range of motion, Decreased strength, Pain  Visit Diagnosis: Pain in left hip  Muscle weakness (generalized)     Problem List Patient Active Problem List   Diagnosis Date Noted  . Renal cell carcinoma of right kidney ([place]) 02/01/2017  . Postoperative anemia due to acute blood loss 02/01/2017  . Renal mass 01/30/2017  . 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 Primary osteoarthritis of left hip 12/25/2016  . Primary localized osteoarthritis of right knee 08/26/2016  . Primary osteoarthritis of right knee 08/25/2016  . Status post revision of total hip 04/24/2016  . ED (erectile dysfunction) of organic origin 07/01/2012  . Malignant neoplasm of prostate ([place]) 07/01/2012  . Microscopic hematuria 07/01/2012  . Cervical stenosis of spinal canal 01/10/2012    Yariela Tison P, PTA 05/05/2017, 2:00 PM  [facility] [place], [place], [address] Phone: [phone]   Fax:  [phone]  Name: DAELYN MOZER MRN: [id number] Date of Birth: 02-08-1946

## 2017-05-08 ENCOUNTER — Ambulatory Visit: Payer: Medicare Other | Admitting: Physical Therapy

## 2017-05-08 ENCOUNTER — Encounter: Payer: Self-pay | Admitting: Physical Therapy

## 2017-05-08 DIAGNOSIS — M25552 Pain in left hip: Secondary | ICD-10-CM

## 2017-05-08 DIAGNOSIS — M6281 Muscle weakness (generalized): Secondary | ICD-10-CM

## 2017-05-08 NOTE — Therapy (Signed)
[facility] [place], [place], [address] Phone: [phone]   Fax:  [phone]  Physical Therapy Treatment  Patient Details  Name: Jonathan Harvey MRN: [id number] Date of Birth: 1945/05/04 Referring Provider: [name] MD.   Encounter Date: 05/08/2017  PT End of Session - 05/08/17 1116    Visit Number  16    Number of Visits  16    Date for PT Re-Evaluation  05/12/17    PT Start Time  1031    PT Stop Time  1128    PT Time Calculation (min)  57 min    Activity Tolerance  Patient tolerated treatment well    Behavior During Therapy  [facility] for tasks assessed/performed       Past Medical History:  Diagnosis Date  . Arthritis    "all over" (12/25/2016)  . Chronic lower back pain    "severe" (12/25/2016)  . Hepatitis B 2008  . History of kidney stones   . Hypertension   . Hypertriglyceridemia   . Prostate cancer ([place]) 10/2006   s/p robotic prostatectomy   . Renal cell carcinoma, right ([place])   . Spinal stenosis     Past Surgical History:  Procedure Laterality Date  . CARPAL TUNNEL RELEASE Right 2010  . CYST EXCISION Left    elbow  . FEMUR SURGERY Left ~ 1962   "femur was growing crooked; broke it to straighten it out; body cast for ~ 4 months"  . FINGER SURGERY Right 1987   right thumb and index finger (pt denies this hx on 12/25/2016)  . FRACTURE SURGERY    . INGUINAL HERNIA REPAIR Left 2008  . JOINT REPLACEMENT    . MASTOIDECTOMY  1982  . ORIF GREATER TROCHANTERIC FRACTURE Left 12/25/2016    ACETABULAR REVISION/notes 12/25/2016  . PROSTATECTOMY  10/2006  . ROBOTIC ASSITED PARTIAL NEPHRECTOMY Right 01/30/2017   Procedure: ROBOTIC ASSITED PARTIAL NEPHRECTOMY;  Surgeon: Ardis Hughs, MD;  [facility];  Service: Urology;  Laterality: Right;  . TOTAL HIP ARTHROPLASTY Left 2002  . TOTAL HIP REVISION Left 04/24/2016   Procedure: TOTAL HIP REVISION;  Surgeon: [name], MD;  Location: [place];  Service:  Orthopedics;  Laterality: Left;  . TOTAL HIP REVISION Left 12/25/2016   Procedure: OPEN REDUCTION INTERNAL FIXATION GREATER TROCHANTERIC FRACTURE, ACETABULAR REVISION;  Surgeon: [name], MD;  Location: [place];  Service: Orthopedics;  Laterality: Left;  . TOTAL KNEE ARTHROPLASTY Right 08/26/2016   Procedure: RIGHT TOTAL KNEE ARTHROPLASTY;  Surgeon: [name], MD;  Location: [place];  Service: Orthopedics;  Laterality: Right;    There were no vitals filed for this visit.  Subjective Assessment - 05/08/17 1033    Subjective  Patient reported doing well and today will be last day due to F/U with MD and feeling good overall    Pertinent History  3 left hip surgeries; right TKA.    How long can you stand comfortably?  10 minutes.    How long can you walk comfortably?  Short distances in community.    Patient Stated Goals  Walk and get around with no pain.    Currently in Pain?  Yes    Pain Score  2     Pain Location  Hip    Pain Orientation  Left    Pain Descriptors / Indicators  Sore    Pain Type  Chronic pain    Pain Onset  More than a month ago  Pain Frequency  Intermittent    Aggravating Factors   certain movements or prolong position    Pain Relieving Factors  meds and rest         [facility] PT Assessment - 05/08/17 0001      ROM / Strength   AROM / PROM / Strength  Strength      AROM   AROM Assessment Site  --      Strength   Strength Assessment Site  Hip    Right/Left Hip  Left    Left Hip ABduction  4/5                  [facility] Adult PT Treatment/Exercise - 05/08/17 0001      Knee/Hip Exercises: Aerobic   Nustep  L7 x15 min      Knee/Hip Exercises: Machines for Strengthening   Cybex Knee Extension  20# x fatigue    Cybex Knee Flexion  40# x fatigue      Knee/Hip Exercises: Standing   Lateral Step Up  20 reps;Left;Step Height: 6"    Step Down  Left;20 reps;Hand Hold: 2;Step Height: 6"      Electrical Stimulation   Electrical Stimulation Location  L  lateral hip    Electrical Stimulation Action  premod    Electrical Stimulation Parameters  80-'150hz'  x93mn    Electrical Stimulation Goals  Pain               PT Short Term Goals - 04/18/17 1128      PT SHORT TERM GOAL #1   Title  Independent with an initial HEP.    Time  2    Period  Weeks    Status  Achieved via verbal instructions for exercise        PT Long Term Goals - 05/08/17 1051      PT LONG TERM GOAL #1   Title  Independent with an advanced HEP.    Time  8    Period  Weeks    Status  Achieved      PT LONG TERM GOAL #2   Title  Right hip abduction to a solid 4/5 to increase stability for walking and functional actiivtes.    Time  8    Period  Weeks    Status  Achieved 05/08/17      PT LONG TERM GOAL #3   Title  Perform ADL's with pain not > 3/10.    Time  8    Period  Weeks    Status  Achieved      PT LONG TERM GOAL #4   Title  Perform a reciprocating stair gait with one railing with pain not > 2-3/10.    Time  8    Period  Weeks    Status  Not Met decending difficult and 4/10 05/08/17      PT LONG TERM GOAL #5   Title  Walk a community distance with pain not > 3/10.    Time  8    Period  Weeks    Status  Not Met 4/10 05/08/17            Plan - 05/08/17 1117    Clinical Impression Statement  Patient tolerated treatment well and overall improvement reported up to 70% overall per reported. Patient Has minimal ongoing discomfort with prolong sitting, decending stairs or prolong walking up to 4/10 pain at most. Patient has improved left hip strength 4/5 and met 3 out  of 5 goals today. FOTO score 44% limitation (initial 65%).    Rehab Potential  Good    PT Frequency  2x / week    PT Duration  8 weeks    PT Treatment/Interventions  Visual/perceptual remediation/compensation    PT Next Visit Plan  DC    Consulted and Agree with Plan of Care  Patient       Patient will benefit from skilled therapeutic intervention in order to improve the  following deficits and impairments:  Abnormal gait, Decreased activity tolerance, Decreased range of motion, Decreased strength, Pain  Visit Diagnosis: Pain in left hip  Muscle weakness (generalized)     Problem List Patient Active Problem List   Diagnosis Date Noted  . Renal cell carcinoma of right kidney ([place]) 02/01/2017  . Postoperative anemia due to acute blood loss 02/01/2017  . Renal mass 01/30/2017  . 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 Primary osteoarthritis of left hip 12/25/2016  . Primary localized osteoarthritis of right knee 08/26/2016  . Primary osteoarthritis of right knee 08/25/2016  . Status post revision of total hip 04/24/2016  . ED (erectile dysfunction) of organic origin 07/01/2012  . Malignant neoplasm of prostate ([place]) 07/01/2012  . Microscopic hematuria 07/01/2012  . Cervical stenosis of spinal canal 01/10/2012    Ladean Raya, PTA 05/08/17 11:29 AM  [facility] [address] [place], [place], [address] Phone: [phone]   Fax:  [phone]  Name: Jonathan Harvey MRN: [id number] Date of Birth: [birth year]  PHYSICAL THERAPY DISCHARGE SUMMARY  Visits from Start of Care: 16.  Current functional level related to goals / functional outcomes: See above.   Remaining deficits: Goals #4 and 5 unmet.   Education / Equipment: HEP. Plan: Patient agrees to discharge.  Patient goals were partially met. Patient is being discharged due to being pleased with the current functional level.  ?????         Mali Applegate MPT

## 2017-05-27 ENCOUNTER — Telehealth: Payer: Self-pay | Admitting: Gastroenterology

## 2017-05-27 NOTE — Telephone Encounter (Signed)
Pt is requesting to transfer care from [facility] last seen 03/2017. States he would feel much more comfortable being seen by one of our doctors and wants Dr.Jacobs to review his records. Records have been placed on Dr.Jacobs desk for review.

## 2017-06-03 NOTE — Telephone Encounter (Signed)
Dr.Jacobs reviewed records and accepted patient to be scheduled for the next available office visit. Patient has been notified of this and has been scheduled for an office visit with Dr.Jacobs.

## 2017-07-22 ENCOUNTER — Telehealth: Payer: Self-pay | Admitting: Gastroenterology

## 2017-07-22 ENCOUNTER — Ambulatory Visit: Payer: Medicare Other | Admitting: Gastroenterology

## 2017-07-22 ENCOUNTER — Encounter: Payer: Self-pay | Admitting: Gastroenterology

## 2017-07-22 VITALS — BP 106/68 | HR 76 | Ht 69.0 in | Wt 191.0 lb

## 2017-07-22 DIAGNOSIS — K319 Disease of stomach and duodenum, unspecified: Secondary | ICD-10-CM

## 2017-07-22 NOTE — Progress Notes (Signed)
HPI: This is a  very pleasant 72 year old man abnormal Diette duodenum who was referred to me by Burman Nieves, MD  to evaluate abnormal duodenum on recent CT scan.    Chief complaint is abnormal duodenum on recent CT scan  Right partial nephrectomy nov 2018.  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  Papillary cell type.  The margins were negative.  He gets intermittent CT scans as surveillance and his most recent CT scan March 2018 showed an abnormal area in his proximal duodenum.  See those results summarized below.  Brother died of colon cancer (to the best o fhis knowledge) he has never had Problems with his intestines that he is aware of.  He has no trouble eating, no postprandial pain, no nausea or vomiting.  No significant abdominal pains.  He gained some weight in 2018 since he was quite a bit more sedentary than usual after a couple orthopedic surgeries and his urologic surgery.  He is losing some of that most recently.  Old Data Reviewed: CT scan March 2019 ordered by his urologist as follow-up for renal cell cancer.  "Possible soft tissue fullness in the descending duodenum, cephalad to the ampulla."  I reviewed several fragments of reports that were received from his previous gastroenterologists in [place]  He has had multiple colonoscopies over the years.  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  It was 2 x 2 cm, pedunculated polyp at 10 cm which had severe epithelial dysplasia but negative for carcinoma.  The patient had a follow-up flexible sigmoidoscopy in August 1998 which was normal except for some hemorrhoids."  Colonoscopy June 2002 Dr. Erlene Quan.   findings there were two 98mm "bumps" in his cecum and his rectum these were both biopsied.  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  I do not have the colonoscopy  procedure report from however.  Colonoscopy January 2019, Dr. Kennedy Bucker, several pictures from the report were sent area I do not have only very limited text from the report except for the last page where he wrote his impressions, 3 subcm polyps were removed from the colon.  Diverticulosis was noted in the sigmoid colon.  Pathology report shows two of the polyps were adenomatous and one was a sessile serrated type.    Review of systems: Pertinent positive and negative review of systems were noted in the above HPI section. All other review negative.   Past Medical History:  Diagnosis Date  . Arthritis    "all over" (12/25/2016)  . Chronic lower back pain    "severe" (12/25/2016)  . Hepatitis B 2008  . History of kidney stones   . Hypertension   . Hypertriglyceridemia   . Prostate cancer ([place]) 10/2006   s/p robotic prostatectomy   . Renal cell carcinoma, right ([place])   . Spinal stenosis     Past Surgical History:  Procedure Laterality Date  . CARPAL TUNNEL RELEASE Right 2010  . CYST EXCISION Left    elbow  . FEMUR SURGERY Left ~ 1962   "femur was growing crooked; broke it to straighten it out; body cast for ~ 4 months"  . FINGER SURGERY Right 1987   right thumb and index finger (pt denies this hx on 12/25/2016)  . FRACTURE SURGERY    . INGUINAL HERNIA REPAIR Left 2008  . JOINT REPLACEMENT    .  MASTOIDECTOMY  1982  . ORIF GREATER TROCHANTERIC FRACTURE Left 12/25/2016    ACETABULAR REVISION/notes 12/25/2016  . PROSTATECTOMY  10/2006  . ROBOTIC ASSITED PARTIAL NEPHRECTOMY Right 01/30/2017   Procedure: ROBOTIC ASSITED PARTIAL NEPHRECTOMY;  Surgeon: Ardis Hughs, MD;  [facility];  Service: Urology;  Laterality: Right;  . TOTAL HIP ARTHROPLASTY Left 2002  . TOTAL HIP REVISION Left 04/24/2016   Procedure: TOTAL HIP REVISION;  Surgeon: [name], MD;  Location: [place];  Service: Orthopedics;  Laterality: Left;  . TOTAL HIP REVISION Left 12/25/2016   Procedure: OPEN  REDUCTION INTERNAL FIXATION GREATER TROCHANTERIC FRACTURE, ACETABULAR REVISION;  Surgeon: [name], MD;  Location: [place];  Service: Orthopedics;  Laterality: Left;  . TOTAL KNEE ARTHROPLASTY Right 08/26/2016   Procedure: RIGHT TOTAL KNEE ARTHROPLASTY;  Surgeon: [name], MD;  Location: [place];  Service: Orthopedics;  Laterality: Right;    Current Outpatient Medications  Medication Sig Dispense Refill  . aspirin EC 81 MG tablet Take 81 mg by mouth daily.    . cetirizine-pseudoephedrine (ZYRTEC-D) 5-120 MG tablet Take 1 tablet by mouth at bedtime.    . Cholecalciferol (VITAMIN D3) 3000 units TABS Take 2,000 Units by mouth.    . Cyanocobalamin (VITAMIN B 12 PO) Take by mouth daily.    . furosemide (LASIX) 40 MG tablet Take 20 mg by mouth daily.     Marland Kitchen gabapentin (NEURONTIN) 300 MG capsule Take 300 mg See admin instructions by mouth. Morning, Lunch,  (may take dose if needed at bedtime)    . losartan-hydrochlorothiazide (HYZAAR) 100-25 MG tablet Take 1 tablet by mouth daily.    . metoprolol succinate (TOPROL-XL) 25 MG 24 hr tablet Take 25 mg by mouth daily.    . Multiple Vitamin (MULTIVITAMIN) tablet Take 1 tablet by mouth daily.    . simvastatin (ZOCOR) 40 MG tablet Take 20 mg by mouth at bedtime.    . traMADol (ULTRAM) 50 MG tablet Take 1-2 tablets (50-100 mg total) by mouth every 6 (six) hours as needed for moderate pain or severe pain (for pain). 30 tablet 0   No current facility-administered medications for this visit.     Allergies as of 07/22/2017 - Review Complete 07/22/2017  Allergen Reaction Noted  . Lactose intolerance (gi) Other (See Comments) 04/15/2016    History reviewed. No pertinent family history.  Social History   Socioeconomic History  . Marital status: Married    Spouse name: Not on file  . Number of children: Not on file  . Years of education: Not on file  . Highest education level: Not on file  Occupational History  . Not on file  Social Needs  .  Financial resource strain: Not on file  . Food insecurity:    Worry: Not on file    Inability: Not on file  . Transportation needs:    Medical: Not on file    Non-medical: Not on file  Tobacco Use  . Smoking status: Former Smoker    Years: 20.00    Types: Pipe    Last attempt to quit: 1991    Years since quitting: 28.4  . Smokeless tobacco: Never Used  Substance and Sexual Activity  . Alcohol use: Yes    Alcohol/week: 1.8 oz    Types: 1 Glasses of wine, 2 Cans of beer per week    Comment: daily  . Drug use: Yes    Types: Marijuana    Comment: as Electronics engineer  . Sexual activity: Yes  Partners: Female  Lifestyle  . Physical activity:    Days per week: Not on file    Minutes per session: Not on file  . Stress: Not on file  Relationships  . Social connections:    Talks on phone: Not on file    Gets together: Not on file    Attends religious service: Not on file    Active member of club or organization: Not on file    Attends meetings of clubs or organizations: Not on file    Relationship status: Not on file  . Intimate partner violence:    Fear of current or ex partner: Not on file    Emotionally abused: Not on file    Physically abused: Not on file    Forced sexual activity: Not on file  Other Topics Concern  . Not on file  Social History Narrative  . Not on file     Physical Exam: BP 106/68   Pulse 76   Ht 5\' 9"  (1.753 m)   Wt 191 lb (86.6 kg)   BMI 28.21 kg/m  Constitutional: generally well-appearing Psychiatric: alert and oriented x3 Eyes: extraocular movements intact Mouth: oral pharynx moist, no lesions Neck: supple no lymphadenopathy Cardiovascular: heart regular rate and rhythm Lungs: clear to auscultation bilaterally Abdomen: soft, nontender, nondistended, no obvious ascites, no peritoneal signs, normal bowel sounds Extremities: no lower extremity edema bilaterally Skin: no lesions on visible extremities   Assessment and plan: 72 y.o.  male with incidentally noted abnormal duodenum on CT scan  First I reassured him that I think it is highly unlikely that this area of his duodenum represents a cancer of any kind.  Indeed it is not uncommon that CT scan over calls regions of the intestines and I would not be surprised if his duodenum is actually completely normal by endoscopy.  I do have some suspicion that there is a polyp there however.  I recommended starting evaluation with traditional upper endoscopy at his soonest convenience.  I see no reason for any further blood tests or imaging studies prior to then.    Please see the "Patient Instructions" section for addition details about the plan.   Owens Loffler, MD [facility] 07/22/2017, 1:31 PM  Cc: Burman Nieves, MD

## 2017-07-22 NOTE — Patient Instructions (Addendum)
You will be set up for an upper endoscopy for abnormal duodenum on CT scan.  Normal BMI (Body Mass Index- based on height and weight) is between 23 and 30. Your BMI today is Body mass index is 28.21 kg/m. Marland Kitchen Please consider follow up  regarding your BMI with your Primary Care Provider.

## 2017-07-22 NOTE — Telephone Encounter (Signed)
  I reviewed several fragments of reports that were received from his previous gastroenterologists in [place]  He has had multiple colonoscopies over the years.  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  It was 2 x 2 cm, pedunculated polyp at 10 cm which had severe epithelial dysplasia but negative for carcinoma.  The patient had a follow-up flexible sigmoidoscopy in August 1998 which was normal except for some hemorrhoids."  Colonoscopy June 2002 Dr. Erlene Quan.   findings there were two 78mm "bumps" in his cecum and his rectum these were both biopsied.  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  I do not have the colonoscopy procedure report from however.  Colonoscopy January 2019, Dr. Kennedy Bucker, several pictures from the report were sent area I do not have only very limited text from the report except for the last page where he wrote his impressions, 3 subcm polyps were removed from the colon.  Diverticulosis was noted in the sigmoid colon.  Pathology report shows two of the polyps were adenomatous and one was a sessile serrated type.

## 2017-07-23 ENCOUNTER — Encounter: Payer: Self-pay | Admitting: Gastroenterology

## 2017-07-23 ENCOUNTER — Other Ambulatory Visit: Payer: Self-pay

## 2017-07-23 ENCOUNTER — Ambulatory Visit (AMBULATORY_SURGERY_CENTER): Payer: Medicare Other | Admitting: Gastroenterology

## 2017-07-23 VITALS — BP 117/57 | HR 65 | Temp 97.5°F | Resp 16 | Ht 69.0 in | Wt 191.0 lb

## 2017-07-23 DIAGNOSIS — K2980 Duodenitis without bleeding: Secondary | ICD-10-CM | POA: Diagnosis not present

## 2017-07-23 DIAGNOSIS — K319 Disease of stomach and duodenum, unspecified: Secondary | ICD-10-CM

## 2017-07-23 DIAGNOSIS — R935 Abnormal findings on diagnostic imaging of other abdominal regions, including retroperitoneum: Secondary | ICD-10-CM

## 2017-07-23 MED ORDER — SODIUM CHLORIDE 0.9 % IV SOLN: 500.0000 mL | Freq: Once | INTRAVENOUS | Status: DC

## 2017-07-23 NOTE — Progress Notes (Signed)
Report to PACU, RN, vss, BBS= Clear.  

## 2017-07-23 NOTE — Patient Instructions (Signed)
YOU HAD AN ENDOSCOPIC PROCEDURE TODAY AT THE [facility]:   Refer to the procedure report that was given to you for any specific questions about what was found during the examination.  If the procedure report does not answer your questions, please call your gastroenterologist to clarify.  If you requested that your care partner not be given the details of your procedure findings, then the procedure report has been included in a sealed envelope for you to review at your convenience later.  YOU SHOULD EXPECT: Some feelings of bloating in the abdomen. Passage of more gas than usual.  Walking can help get rid of the air that was put into your GI tract during the procedure and reduce the bloating. If you had a lower endoscopy (such as a colonoscopy or flexible sigmoidoscopy) you may notice spotting of blood in your stool or on the toilet paper. If you underwent a bowel prep for your procedure, you may not have a normal bowel movement for a few days.  Please Note:  You might notice some irritation and congestion in your nose or some drainage.  This is from the oxygen used during your procedure.  There is no need for concern and it should clear up in a day or so.  SYMPTOMS TO REPORT IMMEDIATELY:   Following upper endoscopy (EGD)  Vomiting of blood or coffee ground material  New chest pain or pain under the shoulder blades  Painful or persistently difficult swallowing  New shortness of breath  Fever of 100F or higher  Black, tarry-looking stools  For urgent or emergent issues, a gastroenterologist can be reached at any hour by calling [phone].   DIET:  We do recommend a small meal at first, but then you may proceed to your regular diet.  Drink plenty of fluids but you should avoid alcoholic beverages for 24 hours.  ACTIVITY:  You should plan to take it easy for the rest of today and you should NOT DRIVE or use heavy machinery until tomorrow (because of the sedation medicines used  during the test).    FOLLOW UP: Our staff will call the number listed on your records the next business day following your procedure to check on you and address any questions or concerns that you may have regarding the information given to you following your procedure. If we do not reach you, we will leave a message.  However, if you are feeling well and you are not experiencing any problems, there is no need to return our call.  We will assume that you have returned to your regular daily activities without incident.  If any biopsies were taken you will be contacted by phone or by letter within the next 1-3 weeks.  Please call us at [phone] if you have not heard about the biopsies in 3 weeks.    SIGNATURES/CONFIDENTIALITY: You and/or your care partner have signed paperwork which will be entered into your electronic medical record.  These signatures attest to the fact that that the information above on your After Visit Summary has been reviewed and is understood.  Full responsibility of the confidentiality of this discharge information lies with you and/or your care-partner. 

## 2017-07-23 NOTE — Progress Notes (Signed)
Called to room to assist during endoscopic procedure.  Patient ID and intended procedure confirmed with present staff. Received instructions for my participation in the procedure from the performing physician.  

## 2017-07-23 NOTE — Progress Notes (Signed)
I have reviewed the patient's medical history in detail and updated the computerized patient record.

## 2017-07-23 NOTE — Op Note (Signed)
[facility] Patient Name: Jonathan Harvey Procedure Date: 07/23/2017 10:27 AM MRN: [id number] Endoscopist: Milus Banister , MD Age: 72 Referring MD:  Date of Birth: 07-Oct-1945 Gender: Male Account #: [id number] Procedure:                Upper GI endoscopy Indications:              Abnormal CT of the GI tract (duodenum) Medicines:                Monitored Anesthesia Care Procedure:                Pre-Anesthesia Assessment:                           - Prior to the procedure, a History and Physical                            was performed, and patient medications and                            allergies were reviewed. The patient's tolerance of                            previous anesthesia was also reviewed. The risks                            and benefits of the procedure and the sedation                            options and risks were discussed with the patient.                            All questions were answered, and informed consent                            was obtained. Prior Anticoagulants: The patient has                            taken no previous anticoagulant or antiplatelet                            agents. ASA Grade Assessment: II - A patient with                            mild systemic disease. After reviewing the risks                            and benefits, the patient was deemed in                            satisfactory condition to undergo the procedure.                           After obtaining informed consent, the endoscope was  passed under direct vision. Throughout the                            procedure, the patient's blood pressure, pulse, and                            oxygen saturations were monitored continuously. The                            Endoscope was introduced through the mouth, and                            advanced to the third part of duodenum. The upper                            GI endoscopy was  accomplished without difficulty.                            The patient tolerated the procedure well. Scope In: Scope Out: Findings:                 Very minor Schatzki's ring at the GE junction.                           At the most distal aspect of the duodenal bulb, at                            the angulation into D2, the mucosa was slightly                            irregular but not neoplastic appearing. Observing                            peristalsis in the duodenum, the angulation site                            appeared more or less polypoid during peristalsis                            (see sequence of images). Biopsies taken.                           The exam was otherwise without abnormality. Complications:            No immediate complications. Estimated blood loss:                            None. Estimated Blood Loss:     Estimated blood loss: none. Impression:               - At angulation of distal duodenal bulb normal                            peristaltic waves created a polypoid like puckering  of the mucosa (see image sequence). This is almost                            certainlY what was noted by CT scan. Biopsies taken                            but I think this is very unlikely to represent                            neoplaSm.                           - Minor, non-obstructing Schatzki's ring. Recommendation:           - Patient has a contact number available for                            emergencies. The signs and symptoms of potential                            delayed complications were discussed with the                            patient. Return to normal activities tomorrow.                            Written discharge instructions were provided to the                            patient.                           - Resume previous diet.                           - Continue present medications.                           - Await  pathology results. Milus Banister, MD 07/23/2017 10:54:07 AM This report has been signed electronically.

## 2017-07-24 ENCOUNTER — Telehealth: Payer: Self-pay

## 2017-07-24 NOTE — Telephone Encounter (Signed)
  Follow up Call-  Call Tyrrell Stephens number 07/23/2017  Post procedure Call Bertina Guthridge phone  # [phone]  Permission to leave phone message Yes  Some recent data might be hidden     Patient questions:  Do you have a fever, pain , or abdominal swelling? No. Pain Score  0 *  Have you tolerated food without any problems? Yes.    Have you been able to return to your normal activities? Yes.    Do you have any questions about your discharge instructions: Diet   No. Medications  No. Follow up visit  No.  Do you have questions or concerns about your Care? No.  Actions: * If pain score is 4 or above: No action needed, pain <4.

## 2017-08-01 NOTE — Progress Notes (Signed)
Left message on patients voice mail to contact office and ask for Shylin Keizer for results

## 2017-08-05 ENCOUNTER — Telehealth: Payer: Self-pay | Admitting: Gastroenterology

## 2017-08-05 NOTE — Telephone Encounter (Signed)
Patient was notified of biopsy report. He voiced understanding.

## 2017-08-05 NOTE — Telephone Encounter (Signed)
-----   Message from Milus Banister, MD sent at 08/01/2017  8:05 AM EDT -----  Claiborne Billings, Please call the patient.  The biopsied from his duodenum show NO SIGN of cancer or polyp.  No further testing is needed.  Santiago Glad, no result note is needed.

## 2018-03-27 ENCOUNTER — Other Ambulatory Visit: Payer: Self-pay | Admitting: Physical Medicine and Rehabilitation

## 2018-03-27 DIAGNOSIS — M545 Low back pain, unspecified: Secondary | ICD-10-CM

## 2018-04-07 ENCOUNTER — Other Ambulatory Visit: Payer: Medicare Other

## 2018-04-14 ENCOUNTER — Ambulatory Visit
Admission: RE | Admit: 2018-04-14 | Discharge: 2018-04-14 | Disposition: A | Discharge status: Home / Self Care | Payer: Medicare Other | Source: Ambulatory Visit | Attending: Physical Medicine and Rehabilitation | Admitting: Physical Medicine and Rehabilitation

## 2018-04-14 DIAGNOSIS — M545 Low back pain, unspecified: Secondary | ICD-10-CM

## 2019-02-09 ENCOUNTER — Other Ambulatory Visit: Payer: Self-pay | Admitting: Orthopedic Surgery

## 2019-02-09 DIAGNOSIS — M25511 Pain in right shoulder: Secondary | ICD-10-CM

## 2019-02-10 ENCOUNTER — Ambulatory Visit
Admission: RE | Admit: 2019-02-10 | Discharge: 2019-02-10 | Disposition: A | Discharge status: Home / Self Care | Payer: Medicare Other | Source: Ambulatory Visit | Attending: Orthopedic Surgery | Admitting: Orthopedic Surgery

## 2019-02-10 DIAGNOSIS — M25511 Pain in right shoulder: Secondary | ICD-10-CM

## 2019-02-22 ENCOUNTER — Other Ambulatory Visit: Payer: Self-pay

## 2019-02-22 ENCOUNTER — Ambulatory Visit: Payer: Medicare Other | Admitting: Physical Therapy

## 2019-02-23 ENCOUNTER — Encounter: Payer: Self-pay | Admitting: Physical Therapy

## 2019-02-23 ENCOUNTER — Ambulatory Visit: Payer: Medicare Other | Attending: Orthopedic Surgery | Admitting: Physical Therapy

## 2019-02-23 DIAGNOSIS — G8929 Other chronic pain: Secondary | ICD-10-CM | POA: Diagnosis present

## 2019-02-23 DIAGNOSIS — M6281 Muscle weakness (generalized): Secondary | ICD-10-CM

## 2019-02-23 DIAGNOSIS — M25611 Stiffness of right shoulder, not elsewhere classified: Secondary | ICD-10-CM | POA: Insufficient documentation

## 2019-02-23 DIAGNOSIS — M25511 Pain in right shoulder: Secondary | ICD-10-CM | POA: Insufficient documentation

## 2019-02-23 NOTE — Therapy (Signed)
[facility] [place], [place], [address] Phone: [phone]   Fax:  [phone]  Physical Therapy Evaluation  Patient Details  Name: Jonathan Harvey MRN: [id number] Date of Birth: May 01, 1945 (73) Referring Provider (PT): Grier Mitts, [place]   Encounter Date: 02/23/2019  PT End of Session - 02/23/19 1443    Visit Number  1    Number of Visits  12    Date for PT Re-Evaluation  04/13/19    Authorization Type  Progress note every 10th visit; KX modifier at 15th visit    PT Start Time  1345    PT Stop Time  1445    PT Time Calculation (min)  60 min    Activity Tolerance  Patient tolerated treatment well    Behavior During Therapy  [facility] for tasks assessed/performed       Past Medical History:  Diagnosis Date  . Arthritis    "all over" (12/25/2016)  . Chronic lower back pain    "severe" (12/25/2016)  . Hepatitis B 2008  . History of kidney stones   . Hypertension   . Hypertriglyceridemia   . Prostate cancer ([place]) 10/2006   s/p robotic prostatectomy   . Renal cell carcinoma, right ([place])   . Spinal stenosis     Past Surgical History:  Procedure Laterality Date  . CARPAL TUNNEL RELEASE Right 2010  . CYST EXCISION Left    elbow  . FEMUR SURGERY Left ~ 1962   "femur was growing crooked; broke it to straighten it out; body cast for ~ 4 months"  . FINGER SURGERY Right 1987   right thumb and index finger (pt denies this hx on 12/25/2016)  . FRACTURE SURGERY    . INGUINAL HERNIA REPAIR Left 2008  . JOINT REPLACEMENT    . MASTOIDECTOMY  1982  . ORIF GREATER TROCHANTERIC FRACTURE Left 12/25/2016    ACETABULAR REVISION/notes 12/25/2016  . PROSTATECTOMY  10/2006  . ROBOTIC ASSITED PARTIAL NEPHRECTOMY Right 01/30/2017   Procedure: ROBOTIC ASSITED PARTIAL NEPHRECTOMY;  Surgeon: Ardis Hughs, MD;  Location: [facility];  Service: Urology;  Laterality: Right;  . TOTAL HIP ARTHROPLASTY Left 2002  . TOTAL HIP REVISION Left  04/24/2016   Procedure: TOTAL HIP REVISION;  Surgeon: [name], MD;  Location: [facility];  Service: Orthopedics;  Laterality: Left;  . TOTAL HIP REVISION Left 12/25/2016   Procedure: OPEN REDUCTION INTERNAL FIXATION GREATER TROCHANTERIC FRACTURE, ACETABULAR REVISION;  Surgeon: [name], MD;  Location: [facility];  Service: Orthopedics;  Laterality: Left;  . TOTAL KNEE ARTHROPLASTY Right 08/26/2016   Procedure: RIGHT TOTAL KNEE ARTHROPLASTY;  Surgeon: [name], MD;  Location: [facility];  Service: Orthopedics;  Laterality: Right;    There were no vitals filed for this visit.  Subjective Assessment - 02/23/19 1736    Subjective  COVID-19 screening performed upon arrival. Patient arrives to physical therapy with reports of right shoulder pain that began about 6 weeks ago, November 2020. Patient reports an increase of pain with lifting forward, putting the seatbelt on, and brushing his teeth. Patient reports having to compensate to perform dressing activities and drying his back with a towel.  Patient reports pain at worst as 8/10 and pain at best a 1/10. Patient's goals are to decrease pain, improve movement, improve strength and improve ability to perform functional tasks and home activities.    Pertinent History  controlled HTN, chronic lower back pain    Limitations  Lifting;House hold activities    Diagnostic  tests  MRI: RTC tendon tear per patient    Patient Stated Goals  train for strengthening    Currently in Pain?  Yes    Pain Score  8     Pain Location  Shoulder    Pain Orientation  Right    Pain Descriptors / Indicators  Sharp    Pain Type  Acute pain    Pain Onset  More than a month ago    Pain Frequency  Intermittent    Aggravating Factors   activity, raising arm    Pain Relieving Factors  rest    Effect of Pain on Daily Activities  "limits physical activity"         [facility] PT Assessment - 02/23/19 0001      Assessment   Medical Diagnosis  Right shoulder irreparable RTC     Referring Provider (PT)  Grier Mitts, PA-C    Onset Date/Surgical Date  --   ongoing   Hand Dominance  Right    Next MD Visit  03/31/2019    Prior Therapy  no      Precautions   Precautions  None      Restrictions   Weight Bearing Restrictions  No      Balance Screen   Has the patient fallen in the past 6 months  No    Has the patient had a decrease in activity level because of a fear of falling?   No    Is the patient reluctant to leave their home because of a fear of falling?   No      Home Social worker  Private residence    Living Arrangements  Spouse/significant other      Prior Function   Level of Independence  Independent with basic ADLs      Observation/Other Assessments   Focus on Therapeutic Outcomes (FOTO)   to be completed next visit      Posture/Postural Control   Posture/Postural Control  Postural limitations    Postural Limitations  Rounded Shoulders;Forward head      ROM / Strength   AROM / PROM / Strength  PROM;AROM;Strength      AROM   AROM Assessment Site  Shoulder    Right/Left Shoulder  Right    Right Shoulder Flexion  155 Degrees    Right Shoulder ABduction  152 Degrees    Right Shoulder Internal Rotation  --   to abdomen   Right Shoulder External Rotation  28 Degrees      PROM   PROM Assessment Site  Shoulder    Right/Left Shoulder  Right    Right Shoulder Flexion  155 Degrees    Right Shoulder ABduction  155 Degrees    Right Shoulder External Rotation  40 Degrees      Strength   Strength Assessment Site  Shoulder    Right/Left Shoulder  Right    Right Shoulder Flexion  3/5    Right Shoulder ABduction  3/5    Right Shoulder Internal Rotation  3/5    Right Shoulder External Rotation  3-/5      Palpation   Palpation comment  tenderness to right coracoid process but minimal to surrounding musculature      Special Tests    Special Tests  Rotator Cuff Impingement;Laxity/Instability Tests    Rotator Cuff  Impingment tests  Michel Bickers test;Painful Arc of Motion      Hawkins-Kennedy test   Findings  Positive  Side  Right    Comments  reproduction of pain      Painful Arc of Motion   Findings  Positive    Side  Right    Comments  reproduction of pain                   [facility] Adult PT Treatment/Exercise - 02/23/19 0001      Modalities   Modalities  Electrical Stimulation;Moist Heat      Moist Heat Therapy   Number Minutes Moist Heat  15 Minutes    Moist Heat Location  Shoulder      Electrical Stimulation   Electrical Stimulation Location  right shoulder     Electrical Stimulation Action  Modular    Electrical Stimulation Parameters  80-150 hz x15 mins    Electrical Stimulation Goals  Pain                  PT Long Term Goals - 02/23/19 1455      PT LONG TERM GOAL #1   Title  Patient will be independent with HEP    Time  6    Period  Weeks    Status  New      PT LONG TERM GOAL #2   Title  Patient will report ability to perform ADLs with right shoulder pain less than or equal to 4/10.    Time  6    Period  Weeks    Status  New      PT LONG TERM GOAL #3   Title  Patient will demonstrate right 45+ degrees of right shoulder ER AROM to improve ability dressing and drying back.    Time  6    Period  Weeks    Status  New      PT LONG TERM GOAL #4   Title  --      PT LONG TERM GOAL #5   Title  --            Plan - 02/23/19 1507    Clinical Impression Statement  Patient is a 73 year old right handed male who presents to physical therapy with right shoulder pain, decreased ROM and decreased strength that began bout November 2020. Patient noted with intermittent sharp pains with the descent of flexion. Patient (+) for painful arc test as well as right hawkin's kennedy test. Patient denies significant pain with palpation to right shoulder with exception of right coracoid process. Patient discussed obtaining a home TENS unit for pain relief.  Patient would benefit from skilled physical therapy to address deficits and address patient's goals.    Personal Factors and Comorbidities  Age;Time since onset of injury/illness/exacerbation;Comorbidity 1    Comorbidities  HTN    Examination-Activity Limitations  Reach Overhead;Carry;Lift;Dressing    Stability/Clinical Decision Making  Stable/Uncomplicated    Clinical Decision Making  Low    Rehab Potential  Fair    PT Frequency  2x / week    PT Duration  6 weeks    PT Treatment/Interventions  ADLs/Self Care Home Management;Electrical Stimulation;Iontophoresis 4mg /ml Dexamethasone;Moist Heat;Ultrasound;Patient/family education;Manual techniques;Passive range of motion;Therapeutic activities;Therapeutic exercise;Vasopneumatic Device;Cryotherapy    PT Next Visit Plan  Create HEP, FOTO, AAROM to right shoulder, Isometrics and gentle strengthening. Modalities PRN for pain relief.    Consulted and Agree with Plan of Care  Patient       Patient will benefit from skilled therapeutic intervention in order to improve the following deficits and impairments:  Pain,  Postural dysfunction, Decreased strength, Decreased range of motion, Decreased activity tolerance, Impaired UE functional use  Visit Diagnosis: Chronic right shoulder pain  Stiffness of right shoulder, not elsewhere classified  Muscle weakness (generalized)     Problem List Patient Active Problem List   Diagnosis Date Noted  . Renal cell carcinoma of right kidney ([place]) 02/01/2017  . Postoperative anemia due to acute blood loss 02/01/2017  . Renal mass 01/30/2017  . 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 Primary osteoarthritis of left hip 12/25/2016  . Primary localized osteoarthritis of right knee 08/26/2016  . Primary osteoarthritis of right knee 08/25/2016  . Status post revision of total hip 04/24/2016  . ED (erectile dysfunction) of organic origin 07/01/2012  . Malignant neoplasm of prostate ([place]) 07/01/2012   . Microscopic hematuria 07/01/2012  . Cervical stenosis of spinal canal 01/10/2012    Gabriela Eves, PT, DPT 02/23/2019, 5:39 PM  [facility] [address] [place], [place], [address] Phone: [phone]   Fax:  [phone]  Name: BRNDON MIYA MRN: [id number] Date of Birth: 1945/04/10

## 2019-03-01 ENCOUNTER — Ambulatory Visit: Payer: Medicare Other | Admitting: Physical Therapy

## 2019-03-01 ENCOUNTER — Encounter: Payer: Self-pay | Admitting: Physical Therapy

## 2019-03-01 ENCOUNTER — Other Ambulatory Visit: Payer: Self-pay

## 2019-03-01 DIAGNOSIS — M25611 Stiffness of right shoulder, not elsewhere classified: Secondary | ICD-10-CM

## 2019-03-01 DIAGNOSIS — M25511 Pain in right shoulder: Secondary | ICD-10-CM

## 2019-03-01 DIAGNOSIS — M6281 Muscle weakness (generalized): Secondary | ICD-10-CM

## 2019-03-01 DIAGNOSIS — G8929 Other chronic pain: Secondary | ICD-10-CM

## 2019-03-01 NOTE — Therapy (Signed)
[facility] [place], [place], [address] Phone: [phone]   Fax:  [phone]  Physical Therapy Treatment  Patient Details  Name: Jonathan Harvey MRN: [id number] Date of Birth: 1945/10/27 Referring Provider (PT): Grier Mitts, [place]   Encounter Date: 03/01/2019  PT End of Session - 03/01/19 1126    Visit Number  2    Number of Visits  12    Date for PT Re-Evaluation  04/13/19    Authorization Type  Progress note every 10th visit; KX modifier at 15th visit    PT Start Time  1123    PT Stop Time  1211    PT Time Calculation (min)  48 min    Activity Tolerance  Patient tolerated treatment well    Behavior During Therapy  [facility] for tasks assessed/performed       Past Medical History:  Diagnosis Date  . Arthritis    "all over" (12/25/2016)  . Chronic lower back pain    "severe" (12/25/2016)  . Hepatitis B 2008  . History of kidney stones   . Hypertension   . Hypertriglyceridemia   . Prostate cancer ([place]) 10/2006   s/p robotic prostatectomy   . Renal cell carcinoma, right ([place])   . Spinal stenosis     Past Surgical History:  Procedure Laterality Date  . CARPAL TUNNEL RELEASE Right 2010  . CYST EXCISION Left    elbow  . FEMUR SURGERY Left ~ 1962   "femur was growing crooked; broke it to straighten it out; body cast for ~ 4 months"  . FINGER SURGERY Right 1987   right thumb and index finger (pt denies this hx on 12/25/2016)  . FRACTURE SURGERY    . INGUINAL HERNIA REPAIR Left 2008  . JOINT REPLACEMENT    . MASTOIDECTOMY  1982  . ORIF GREATER TROCHANTERIC FRACTURE Left 12/25/2016    ACETABULAR REVISION/notes 12/25/2016  . PROSTATECTOMY  10/2006  . ROBOTIC ASSITED PARTIAL NEPHRECTOMY Right 01/30/2017   Procedure: ROBOTIC ASSITED PARTIAL NEPHRECTOMY;  Surgeon: Ardis Hughs, MD;  [facility];  Service: Urology;  Laterality: Right;  . TOTAL HIP ARTHROPLASTY Left 2002  . TOTAL HIP REVISION Left  04/24/2016   Procedure: TOTAL HIP REVISION;  Surgeon: [name], MD;  Location: [place];  Service: Orthopedics;  Laterality: Left;  . TOTAL HIP REVISION Left 12/25/2016   Procedure: OPEN REDUCTION INTERNAL FIXATION GREATER TROCHANTERIC FRACTURE, ACETABULAR REVISION;  Surgeon: [name], MD;  Location: [place];  Service: Orthopedics;  Laterality: Left;  . TOTAL KNEE ARTHROPLASTY Right 08/26/2016   Procedure: RIGHT TOTAL KNEE ARTHROPLASTY;  Surgeon: [name], MD;  Location: [place];  Service: Orthopedics;  Laterality: Right;    There were no vitals filed for this visit.  Subjective Assessment - 03/01/19 1124    Subjective  COVID 19 screening performed on patient upon arrival. Reports that his shoulder is somewhat better. Limited strength wise and reports learning more compensatory strategy.    Pertinent History  controlled HTN, chronic lower back pain    Limitations  Lifting;House hold activities    Diagnostic tests  MRI: RTC tendon tear per patient    Patient Stated Goals  train for strengthening    Currently in Pain?  Yes    Pain Score  3     Pain Location  Shoulder    Pain Orientation  Right    Pain Descriptors / Indicators  Discomfort    Pain Type  Acute pain  Pain Onset  More than a month ago    Pain Frequency  Intermittent         [facility] PT Assessment - 03/01/19 0001      Assessment   Medical Diagnosis  Right shoulder irreparable RTC    Referring Provider (PT)  Grier Mitts, PA-C    Hand Dominance  Right    Next MD Visit  03/31/2019    Prior Therapy  no      Precautions   Precautions  None      Restrictions   Weight Bearing Restrictions  No                   [facility] Adult PT Treatment/Exercise - 03/01/19 0001      Exercises   Exercises  Shoulder      Shoulder Exercises: Seated   Other Seated Exercises  R AROM upper cut x20 reps      Shoulder Exercises: Pulleys   Flexion  5 minutes      Shoulder Exercises: ROM/Strengthening   UBE (Upper Arm  Bike)  120 RPM x6 min    Ranger  Ue ranger flex x3 min, CW/ CWW circles       Shoulder Exercises: Isometric Strengthening   Flexion  5X5"    Extension  5X5"    External Rotation  5X5"    Internal Rotation  5X5"      Modalities   Modalities  Electrical Stimulation;Moist Heat      Moist Heat Therapy   Number Minutes Moist Heat  10 Minutes    Moist Heat Location  Shoulder      Electrical Stimulation   Electrical Stimulation Location  R shoulder    Electrical Stimulation Action  Pre-Mo    Electrical Stimulation Parameters  80-150 hz x10 in    Electrical Stimulation Goals  Pain                  PT Long Term Goals - 02/23/19 1455      PT LONG TERM GOAL #1   Title  Patient will be independent with HEP    Time  6    Period  Weeks    Status  New      PT LONG TERM GOAL #2   Title  Patient will report ability to perform ADLs with right shoulder pain less than or equal to 4/10.    Time  6    Period  Weeks    Status  New      PT LONG TERM GOAL #3   Title  Patient will demonstrate right 45+ degrees of right shoulder ER AROM to improve ability dressing and drying back.    Time  6    Period  Weeks    Status  New      PT LONG TERM GOAL #4   Title  --      PT LONG TERM GOAL #5   Title  --            Plan - 03/01/19 1214    Clinical Impression Statement  Patient presented in clinic with minimal discomfort upon arrival in L shoulder. Patient introduced to light strengthening and ROM exercises with greatest discomfort with upper cuts and isometric flexion, ER. Normal modalities response noted following removal of the modalities.    Personal Factors and Comorbidities  Age;Time since onset of injury/illness/exacerbation;Comorbidity 1    Comorbidities  HTN    Examination-Activity Limitations  Reach Overhead;Carry;Lift;Dressing  Stability/Clinical Decision Making  Stable/Uncomplicated    Rehab Potential  Fair    PT Frequency  2x / week    PT Duration  6 weeks     PT Treatment/Interventions  ADLs/Self Care Home Management;Electrical Stimulation;Iontophoresis 4mg /ml Dexamethasone;Moist Heat;Ultrasound;Patient/family education;Manual techniques;Passive range of motion;Therapeutic activities;Therapeutic exercise;Vasopneumatic Device;Cryotherapy    PT Next Visit Plan  Provided HEP (4 way isometrics, upper cuts) next treatment.    Consulted and Agree with Plan of Care  Patient       Patient will benefit from skilled therapeutic intervention in order to improve the following deficits and impairments:  Pain, Postural dysfunction, Decreased strength, Decreased range of motion, Decreased activity tolerance, Impaired UE functional use  Visit Diagnosis: Chronic right shoulder pain  Stiffness of right shoulder, not elsewhere classified  Muscle weakness (generalized)     Problem List Patient Active Problem List   Diagnosis Date Noted  . Renal cell carcinoma of right kidney ([place]) 02/01/2017  . Postoperative anemia due to acute blood loss 02/01/2017  . Renal mass 01/30/2017  . 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 Primary osteoarthritis of left hip 12/25/2016  . Primary localized osteoarthritis of right knee 08/26/2016  . Primary osteoarthritis of right knee 08/25/2016  . Status post revision of total hip 04/24/2016  . ED (erectile dysfunction) of organic origin 07/01/2012  . Malignant neoplasm of prostate ([place]) 07/01/2012  . Microscopic hematuria 07/01/2012  . Cervical stenosis of spinal canal 01/10/2012    Standley Brooking, PTA 03/01/2019, 12:18 PM  [facility] [address] [place], [place], [address] Phone: [phone]   Fax:  [phone]  Name: Jonathan Harvey MRN: [id number] Date of Birth: 1946-01-07

## 2019-03-04 ENCOUNTER — Other Ambulatory Visit: Payer: Self-pay

## 2019-03-04 ENCOUNTER — Ambulatory Visit: Payer: Medicare Other | Admitting: Physical Therapy

## 2019-03-04 ENCOUNTER — Encounter: Payer: Self-pay | Admitting: Physical Therapy

## 2019-03-04 DIAGNOSIS — M25511 Pain in right shoulder: Secondary | ICD-10-CM

## 2019-03-04 DIAGNOSIS — M6281 Muscle weakness (generalized): Secondary | ICD-10-CM

## 2019-03-04 DIAGNOSIS — G8929 Other chronic pain: Secondary | ICD-10-CM

## 2019-03-04 DIAGNOSIS — M25611 Stiffness of right shoulder, not elsewhere classified: Secondary | ICD-10-CM

## 2019-03-04 NOTE — Therapy (Signed)
[facility] [place], [place], [address] Phone: [phone]   Fax:  [phone]  Physical Therapy Treatment  Patient Details  Name: Jonathan Harvey MRN: [id number] Date of Birth: 1945/07/04 Referring Provider (PT): Grier Mitts, [place]   Encounter Date: 03/04/2019  PT End of Session - 03/04/19 1205    Visit Number  3    Number of Visits  12    Date for PT Re-Evaluation  04/13/19    Authorization Type  Progress note every 10th visit; KX modifier at 15th visit    PT Start Time  1115    PT Stop Time  1206    PT Time Calculation (min)  51 min    Activity Tolerance  Patient tolerated treatment well    Behavior During Therapy  [facility] for tasks assessed/performed       Past Medical History:  Diagnosis Date  . Arthritis    "all over" (12/25/2016)  . Chronic lower back pain    "severe" (12/25/2016)  . Hepatitis B 2008  . History of kidney stones   . Hypertension   . Hypertriglyceridemia   . Prostate cancer ([place]) 10/2006   s/p robotic prostatectomy   . Renal cell carcinoma, right ([place])   . Spinal stenosis     Past Surgical History:  Procedure Laterality Date  . CARPAL TUNNEL RELEASE Right 2010  . CYST EXCISION Left    elbow  . FEMUR SURGERY Left ~ 1962   "femur was growing crooked; broke it to straighten it out; body cast for ~ 4 months"  . FINGER SURGERY Right 1987   right thumb and index finger (pt denies this hx on 12/25/2016)  . FRACTURE SURGERY    . INGUINAL HERNIA REPAIR Left 2008  . JOINT REPLACEMENT    . MASTOIDECTOMY  1982  . ORIF GREATER TROCHANTERIC FRACTURE Left 12/25/2016    ACETABULAR REVISION/notes 12/25/2016  . PROSTATECTOMY  10/2006  . ROBOTIC ASSITED PARTIAL NEPHRECTOMY Right 01/30/2017   Procedure: ROBOTIC ASSITED PARTIAL NEPHRECTOMY;  Surgeon: Ardis Hughs, MD;  [facility];  Service: Urology;  Laterality: Right;  . TOTAL HIP ARTHROPLASTY Left 2002  . TOTAL HIP REVISION Left  04/24/2016   Procedure: TOTAL HIP REVISION;  Surgeon: [name], MD;  Location: [place];  Service: Orthopedics;  Laterality: Left;  . TOTAL HIP REVISION Left 12/25/2016   Procedure: OPEN REDUCTION INTERNAL FIXATION GREATER TROCHANTERIC FRACTURE, ACETABULAR REVISION;  Surgeon: [name], MD;  Location: [place];  Service: Orthopedics;  Laterality: Left;  . TOTAL KNEE ARTHROPLASTY Right 08/26/2016   Procedure: RIGHT TOTAL KNEE ARTHROPLASTY;  Surgeon: [name], MD;  Location: [place];  Service: Orthopedics;  Laterality: Right;    There were no vitals filed for this visit.  Subjective Assessment - 03/04/19 1203    Subjective  COVID 19 screening performed on patient upon arrival. Patient reports no new complaints; last session went well.    Pertinent History  controlled HTN, chronic lower back pain    Limitations  Lifting;House hold activities    Diagnostic tests  MRI: RTC tendon tear per patient    Patient Stated Goals  train for strengthening    Currently in Pain?  Yes   did not provide number on pain scale        [facility] PT Assessment - 03/04/19 0001      Assessment   Medical Diagnosis  Right shoulder irreparable RTC    Referring Provider (PT)  Grier Mitts, PA-C  Hand Dominance  Right    Next MD Visit  03/31/2019    Prior Therapy  no      Precautions   Precautions  None      Restrictions   Weight Bearing Restrictions  No                   [facility] Adult PT Treatment/Exercise - 03/04/19 0001      Exercises   Exercises  Shoulder      Shoulder Exercises: Pulleys   Flexion  3 minutes      Shoulder Exercises: ROM/Strengthening   UBE (Upper Arm Bike)  120 RPM x8 min    Ranger  Seated UE ranger flexion, CW/ CWW circles x3 minutes each    Wall Wash  flexion, clockwise, counter clockwise x1 min each      Shoulder Exercises: Isometric Strengthening   Flexion  5X5"    Extension  5X5"    External Rotation  5X5"    Internal Rotation  5X5"      Modalities    Modalities  Electrical Stimulation;Moist Heat      Moist Heat Therapy   Number Minutes Moist Heat  10 Minutes    Moist Heat Location  Shoulder      Electrical Stimulation   Electrical Stimulation Location  R shoulder    Electrical Stimulation Action  pre-mod    Electrical Stimulation Parameters  80-150 hz x10 mins    Electrical Stimulation Goals  Pain             PT Education - 03/04/19 1216    Education Details  4 way isometrics    Person(s) Educated  Patient    Methods  Explanation;Demonstration;Handout    Comprehension  Verbalized understanding;Returned demonstration          PT Long Term Goals - 02/23/19 1455      PT LONG TERM GOAL #1   Title  Patient will be independent with HEP    Time  6    Period  Weeks    Status  New      PT LONG TERM GOAL #2   Title  Patient will report ability to perform ADLs with right shoulder pain less than or equal to 4/10.    Time  6    Period  Weeks    Status  New      PT LONG TERM GOAL #3   Title  Patient will demonstrate right 45+ degrees of right shoulder ER AROM to improve ability dressing and drying back.    Time  6    Period  Weeks    Status  New      PT LONG TERM GOAL #4   Title  --      PT LONG TERM GOAL #5   Title  --            Plan - 03/04/19 1205    Clinical Impression Statement  Patient responded well to therapy session. Patient noted with compensatory motions with clockwise wall circles and with reports of increased discomfort with ER motions. Patient provided with HEP consisting of isometrics to which patient reported understanding. No adverse affects upon removal of modalities.    Personal Factors and Comorbidities  Age;Time since onset of injury/illness/exacerbation;Comorbidity 1    Comorbidities  HTN    Examination-Activity Limitations  Reach Overhead;Carry;Lift;Dressing    Stability/Clinical Decision Making  Stable/Uncomplicated    Clinical Decision Making  Low    Rehab Potential  Fair    PT  Frequency  2x / week    PT Duration  6 weeks    PT Treatment/Interventions  ADLs/Self Care Home Management;Electrical Stimulation;Iontophoresis 4mg /ml Dexamethasone;Moist Heat;Ultrasound;Patient/family education;Manual techniques;Passive range of motion;Therapeutic activities;Therapeutic exercise;Vasopneumatic Device;Cryotherapy    PT Next Visit Plan  AAROM and isometric strengthening. Modalities PRN for pain relief.    Consulted and Agree with Plan of Care  Patient       Patient will benefit from skilled therapeutic intervention in order to improve the following deficits and impairments:  Pain, Postural dysfunction, Decreased strength, Decreased range of motion, Decreased activity tolerance, Impaired UE functional use  Visit Diagnosis: Chronic right shoulder pain  Stiffness of right shoulder, not elsewhere classified  Muscle weakness (generalized)     Problem List Patient Active Problem List   Diagnosis Date Noted  . Renal cell carcinoma of right kidney ([place]) 02/01/2017  . Postoperative anemia due to acute blood loss 02/01/2017  . Renal mass 01/30/2017  . 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 Primary osteoarthritis of left hip 12/25/2016  . Primary localized osteoarthritis of right knee 08/26/2016  . Primary osteoarthritis of right knee 08/25/2016  . Status post revision of total hip 04/24/2016  . ED (erectile dysfunction) of organic origin 07/01/2012  . Malignant neoplasm of prostate ([place]) 07/01/2012  . Microscopic hematuria 07/01/2012  . Cervical stenosis of spinal canal 01/10/2012    Gabriela Eves, PT, DPT 03/04/2019, 12:17 PM  [facility] [place], [place], [address] Phone: [phone]   Fax:  [phone]  Name: Jonathan Harvey MRN: [id number] Date of Birth: 1945/09/08

## 2019-03-08 ENCOUNTER — Encounter: Payer: Self-pay | Admitting: Physical Therapy

## 2019-03-08 ENCOUNTER — Ambulatory Visit: Payer: Medicare PPO | Attending: Orthopedic Surgery | Admitting: Physical Therapy

## 2019-03-08 ENCOUNTER — Other Ambulatory Visit: Payer: Self-pay

## 2019-03-08 DIAGNOSIS — M25611 Stiffness of right shoulder, not elsewhere classified: Secondary | ICD-10-CM | POA: Diagnosis present

## 2019-03-08 DIAGNOSIS — M25552 Pain in left hip: Secondary | ICD-10-CM | POA: Insufficient documentation

## 2019-03-08 DIAGNOSIS — M6281 Muscle weakness (generalized): Secondary | ICD-10-CM | POA: Insufficient documentation

## 2019-03-08 DIAGNOSIS — G8929 Other chronic pain: Secondary | ICD-10-CM | POA: Insufficient documentation

## 2019-03-08 DIAGNOSIS — M25511 Pain in right shoulder: Secondary | ICD-10-CM | POA: Insufficient documentation

## 2019-03-08 NOTE — Therapy (Signed)
[facility] [place], [place], [address] Phone: [phone]   Fax:  [phone]  Physical Therapy Treatment  Patient Details  Name: Jonathan Harvey MRN: [id number] Date of Birth: 25-Dec-1945 Referring Provider (PT): Grier Mitts, PA-C   Encounter Date: 03/08/2019  PT End of Session - 03/08/19 1139    Visit Number  4    Number of Visits  12    Date for PT Re-Evaluation  04/13/19    Authorization Type  Progress note every 10th visit; KX modifier at 15th visit    PT Start Time  1115    PT Stop Time  1213    PT Time Calculation (min)  58 min    Activity Tolerance  Patient tolerated treatment well    Behavior During Therapy  [facility] for tasks assessed/performed       Past Medical History:  Diagnosis Date  . Arthritis    "all over" (12/25/2016)  . Chronic lower back pain    "severe" (12/25/2016)  . Hepatitis B 2008  . History of kidney stones   . Hypertension   . Hypertriglyceridemia   . Prostate cancer ([place]) 10/2006   s/p robotic prostatectomy   . Renal cell carcinoma, right ([place])   . Spinal stenosis     Past Surgical History:  Procedure Laterality Date  . CARPAL TUNNEL RELEASE Right 2010  . CYST EXCISION Left    elbow  . FEMUR SURGERY Left ~ 1962   "femur was growing crooked; broke it to straighten it out; body cast for ~ 4 months"  . FINGER SURGERY Right 1987   right thumb and index finger (pt denies this hx on 12/25/2016)  . FRACTURE SURGERY    . INGUINAL HERNIA REPAIR Left 2008  . JOINT REPLACEMENT    . MASTOIDECTOMY  1982  . ORIF GREATER TROCHANTERIC FRACTURE Left 12/25/2016    ACETABULAR REVISION/notes 12/25/2016  . PROSTATECTOMY  10/2006  . ROBOTIC ASSITED PARTIAL NEPHRECTOMY Right 01/30/2017   Procedure: ROBOTIC ASSITED PARTIAL NEPHRECTOMY;  Surgeon: Ardis Hughs, MD;  [facility];  Service: Urology;  Laterality: Right;  . TOTAL HIP ARTHROPLASTY Left 2002  . TOTAL HIP REVISION Left  04/24/2016   Procedure: TOTAL HIP REVISION;  Surgeon: [name], MD;  Location: [place];  Service: Orthopedics;  Laterality: Left;  . TOTAL HIP REVISION Left 12/25/2016   Procedure: OPEN REDUCTION INTERNAL FIXATION GREATER TROCHANTERIC FRACTURE, ACETABULAR REVISION;  Surgeon: [name], MD;  Location: [place];  Service: Orthopedics;  Laterality: Left;  . TOTAL KNEE ARTHROPLASTY Right 08/26/2016   Procedure: RIGHT TOTAL KNEE ARTHROPLASTY;  Surgeon: [name], MD;  Location: [place];  Service: Orthopedics;  Laterality: Right;    There were no vitals filed for this visit.  Subjective Assessment - 03/08/19 1136    Subjective  COVID-19 screening performed upon arrival. Patient reports minimal pain at rest, can reach [id number] with certain activities.    Patient is accompained by:  Interpreter    Pertinent History  controlled HTN, chronic lower back pain    Limitations  Lifting;House hold activities    Diagnostic tests  MRI: RTC tendon tear per patient    Patient Stated Goals  train for strengthening    Currently in Pain?  Yes   minimal at rest, [id number] with certain activities        [facility] PT Assessment - 03/08/19 0001      Assessment   Medical Diagnosis  Right shoulder irreparable RTC  Referring Provider (PT)  Grier Mitts, PA-C    Hand Dominance  Right    Next MD Visit  03/31/2019    Prior Therapy  no                   [facility] Adult PT Treatment/Exercise - 03/08/19 0001      Exercises   Exercises  Shoulder      Shoulder Exercises: Seated   Other Seated Exercises  R AROM upper cut x20 reps    Other Seated Exercises  internal/external rotation x20 AAROM with wand      Shoulder Exercises: Pulleys   Flexion  5 minutes      Shoulder Exercises: ROM/Strengthening   UBE (Upper Arm Bike)  120 RPM x8 min    Wall Wash  clockwise, counter clockwise x2 min total      Shoulder Exercises: Isometric Strengthening   Flexion  5X10"    Extension  5X10"    External Rotation  5X5"     Internal Rotation  5X10"      Modalities   Modalities  Electrical Stimulation;Moist Heat      Moist Heat Therapy   Number Minutes Moist Heat  10 Minutes    Moist Heat Location  Shoulder      Electrical Stimulation   Electrical Stimulation Location  R shoulder    Electrical Stimulation Action  pre-mod    Electrical Stimulation Parameters  80-150 hz x10 mins    Electrical Stimulation Goals  Pain                  PT Long Term Goals - 02/23/19 1455      PT LONG TERM GOAL #1   Title  Patient will be independent with HEP    Time  6    Period  Weeks    Status  New      PT LONG TERM GOAL #2   Title  Patient will report ability to perform ADLs with right shoulder pain less than or equal to 4/10.    Time  6    Period  Weeks    Status  New      PT LONG TERM GOAL #3   Title  Patient will demonstrate right 45+ degrees of right shoulder ER AROM to improve ability dressing and drying back.    Time  6    Period  Weeks    Status  New      PT LONG TERM GOAL #4   Title  --      PT LONG TERM GOAL #5   Title  --            Plan - 03/08/19 1139    Clinical Impression Statement  Patient responded well with minimal reports of increased pain. Patient's isometric hold increased to 10 seconds for all except ER. Patient noted with improvements of ER AAROM in comparison to IE. No adverse afffects upon removal of modalities.    Personal Factors and Comorbidities  Age;Time since onset of injury/illness/exacerbation;Comorbidity 1    Comorbidities  HTN    Examination-Activity Limitations  Reach Overhead;Carry;Lift;Dressing    Stability/Clinical Decision Making  Stable/Uncomplicated    Clinical Decision Making  Low    Rehab Potential  Fair    PT Frequency  2x / week    PT Duration  6 weeks    PT Treatment/Interventions  ADLs/Self Care Home Management;Electrical Stimulation;Iontophoresis 4mg /ml Dexamethasone;Moist Heat;Ultrasound;Patient/family education;Manual  techniques;Passive range of motion;Therapeutic activities;Therapeutic exercise;Vasopneumatic Device;Cryotherapy  PT Next Visit Plan  AAROM and progress strengthening as tolerable,  Modalities PRN for pain relief.    PT Home Exercise Plan  4 way isometrics    Consulted and Agree with Plan of Care  Patient       Patient will benefit from skilled therapeutic intervention in order to improve the following deficits and impairments:  Pain, Postural dysfunction, Decreased strength, Decreased range of motion, Decreased activity tolerance, Impaired UE functional use  Visit Diagnosis: Chronic right shoulder pain  Stiffness of right shoulder, not elsewhere classified  Muscle weakness (generalized)     Problem List Patient Active Problem List   Diagnosis Date Noted  . Renal cell carcinoma of right kidney ([place]) 02/01/2017  . Postoperative anemia due to acute blood loss 02/01/2017  . Renal mass 01/30/2017  . 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 Primary osteoarthritis of left hip 12/25/2016  . Primary localized osteoarthritis of right knee 08/26/2016  . Primary osteoarthritis of right knee 08/25/2016  . Status post revision of total hip 04/24/2016  . ED (erectile dysfunction) of organic origin 07/01/2012  . Malignant neoplasm of prostate ([place]) 07/01/2012  . Microscopic hematuria 07/01/2012  . Cervical stenosis of spinal canal 01/10/2012    Gabriela Eves, PT, DPT 03/08/2019, 12:16 PM  [facility] [address] [place], [place], [address] Phone: [phone]   Fax:  [phone]  Name: Jonathan Harvey MRN: [id number] Date of Birth: [birth year]

## 2019-03-11 ENCOUNTER — Ambulatory Visit: Payer: Medicare PPO | Admitting: Physical Therapy

## 2019-03-11 ENCOUNTER — Other Ambulatory Visit: Payer: Self-pay

## 2019-03-11 DIAGNOSIS — M25611 Stiffness of right shoulder, not elsewhere classified: Secondary | ICD-10-CM

## 2019-03-11 DIAGNOSIS — M25511 Pain in right shoulder: Secondary | ICD-10-CM | POA: Diagnosis not present

## 2019-03-11 DIAGNOSIS — G8929 Other chronic pain: Secondary | ICD-10-CM

## 2019-03-11 DIAGNOSIS — M6281 Muscle weakness (generalized): Secondary | ICD-10-CM

## 2019-03-11 NOTE — Therapy (Signed)
[facility] [place], [place], [address] Phone: [phone]   Fax:  [phone]  Physical Therapy Treatment  Patient Details  Name: Jonathan Harvey MRN: [id number] Date of Birth: Jan 29, 1946 Referring Provider (PT): Grier Mitts, [place]   Encounter Date: 03/11/2019  PT End of Session - 03/11/19 1130    Visit Number  5    Number of Visits  12    Date for PT Re-Evaluation  04/13/19    Authorization Type  Progress note every 10th visit; KX modifier at 15th visit    PT Start Time  1115    PT Stop Time  1207    PT Time Calculation (min)  52 min    Activity Tolerance  Patient tolerated treatment well    Behavior During Therapy  [facility] for tasks assessed/performed       Past Medical History:  Diagnosis Date  . Arthritis    "all over" (12/25/2016)  . Chronic lower back pain    "severe" (12/25/2016)  . Hepatitis B 2008  . History of kidney stones   . Hypertension   . Hypertriglyceridemia   . Prostate cancer ([place]) 10/2006   s/p robotic prostatectomy   . Renal cell carcinoma, right ([place])   . Spinal stenosis     Past Surgical History:  Procedure Laterality Date  . CARPAL TUNNEL RELEASE Right 2010  . CYST EXCISION Left    elbow  . FEMUR SURGERY Left ~ 1962   "femur was growing crooked; broke it to straighten it out; body cast for ~ 4 months"  . FINGER SURGERY Right 1987   right thumb and index finger (pt denies this hx on 12/25/2016)  . FRACTURE SURGERY    . INGUINAL HERNIA REPAIR Left 2008  . JOINT REPLACEMENT    . MASTOIDECTOMY  1982  . ORIF GREATER TROCHANTERIC FRACTURE Left 12/25/2016    ACETABULAR REVISION/notes 12/25/2016  . PROSTATECTOMY  10/2006  . ROBOTIC ASSITED PARTIAL NEPHRECTOMY Right 01/30/2017   Procedure: ROBOTIC ASSITED PARTIAL NEPHRECTOMY;  Surgeon: Ardis Hughs, MD;  [facility];  Service: Urology;  Laterality: Right;  . TOTAL HIP ARTHROPLASTY Left 2002  . TOTAL HIP REVISION Left  04/24/2016   Procedure: TOTAL HIP REVISION;  Surgeon: [name], MD;  Location: [place];  Service: Orthopedics;  Laterality: Left;  . TOTAL HIP REVISION Left 12/25/2016   Procedure: OPEN REDUCTION INTERNAL FIXATION GREATER TROCHANTERIC FRACTURE, ACETABULAR REVISION;  Surgeon: [name], MD;  Location: [place];  Service: Orthopedics;  Laterality: Left;  . TOTAL KNEE ARTHROPLASTY Right 08/26/2016   Procedure: RIGHT TOTAL KNEE ARTHROPLASTY;  Surgeon: [name], MD;  Location: [place];  Service: Orthopedics;  Laterality: Right;    There were no vitals filed for this visit.  Subjective Assessment - 03/11/19 1128    Subjective  COVID-19 screening performed upon arrival. Patient reports no new complaints.    Pertinent History  controlled HTN, chronic lower back pain    Limitations  Lifting;House hold activities    Diagnostic tests  MRI: RTC tendon tear per patient    Patient Stated Goals  train for strengthening    Currently in Pain?  Yes   minimal at rest [id number] with certain movements        [facility] PT Assessment - 03/11/19 0001      Assessment   Medical Diagnosis  Right shoulder irreparable RTC    Referring Provider (PT)  Grier Mitts, PA-C    Hand Dominance  Right  Next MD Visit  03/31/2019    Prior Therapy  no                   [facility] Adult PT Treatment/Exercise - 03/11/19 0001      Exercises   Exercises  Shoulder      Shoulder Exercises: Standing   External Rotation Limitations  attempted but unable    Internal Rotation  Strengthening;20 reps;Theraband    Theraband Level (Shoulder Internal Rotation)  Level 1 (Yellow)    Row  Strengthening;Right;20 reps;Theraband    Theraband Level (Shoulder Row)  Level 1 (Yellow)    Other Standing Exercises  AAROM ER with ranger x2 minutes      Shoulder Exercises: Pulleys   Flexion  5 minutes      Shoulder Exercises: ROM/Strengthening   UBE (Upper Arm Bike)  120 RPM x10 min    Ranger  standing ranger flexion, cw, ccw,  x2 mins each      Modalities   Modalities  Electrical Stimulation;Moist Heat      Moist Heat Therapy   Number Minutes Moist Heat  10 Minutes    Moist Heat Location  Shoulder      Electrical Stimulation   Electrical Stimulation Location  R shoulder    Electrical Stimulation Action  IFC    Electrical Stimulation Parameters  80-150 hz x10 mins    Electrical Stimulation Goals  Pain             PT Education - 03/11/19 1253    Education Details  rows, internal rotation    Person(s) Educated  Patient    Methods  Explanation;Demonstration;Handout    Comprehension  Verbalized understanding;Returned demonstration          PT Long Term Goals - 02/23/19 1455      PT LONG TERM GOAL #1   Title  Patient will be independent with HEP    Time  6    Period  Weeks    Status  New      PT LONG TERM GOAL #2   Title  Patient will report ability to perform ADLs with right shoulder pain less than or equal to 4/10.    Time  6    Period  Weeks    Status  New      PT LONG TERM GOAL #3   Title  Patient will demonstrate right 45+ degrees of right shoulder ER AROM to improve ability dressing and drying back.    Time  6    Period  Weeks    Status  New      PT LONG TERM GOAL #4   Title  --      PT LONG TERM GOAL #5   Title  --            Plan - 03/11/19 1240    Clinical Impression Statement  Patient responded well to therapy session with progression to resisted band exercises. Patient still limited with strength in ER as noted by the inability to perform concentric ER exercises but is progressing well in other muscle groups. No adverse affects upon removal of modalities.    Personal Factors and Comorbidities  Age;Time since onset of injury/illness/exacerbation;Comorbidity 1    Comorbidities  HTN    Examination-Activity Limitations  Reach Overhead;Carry;Lift;Dressing    Stability/Clinical Decision Making  Stable/Uncomplicated    Clinical Decision Making  Low    Rehab Potential   Fair    PT Frequency  2x / week  PT Duration  6 weeks    PT Treatment/Interventions  ADLs/Self Care Home Management;Electrical Stimulation;Iontophoresis 4mg /ml Dexamethasone;Moist Heat;Ultrasound;Patient/family education;Manual techniques;Passive range of motion;Therapeutic activities;Therapeutic exercise;Vasopneumatic Device;Cryotherapy    PT Next Visit Plan  AAROM and progress strengthening as tolerable,  Modalities PRN for pain relief.    Consulted and Agree with Plan of Care  Patient       Patient will benefit from skilled therapeutic intervention in order to improve the following deficits and impairments:  Pain, Postural dysfunction, Decreased strength, Decreased range of motion, Decreased activity tolerance, Impaired UE functional use  Visit Diagnosis: Chronic right shoulder pain  Stiffness of right shoulder, not elsewhere classified  Muscle weakness (generalized)     Problem List Patient Active Problem List   Diagnosis Date Noted  . Renal cell carcinoma of right kidney ([place]) 02/01/2017  . Postoperative anemia due to acute blood loss 02/01/2017  . Renal mass 01/30/2017  . 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 Primary osteoarthritis of left hip 12/25/2016  . Primary localized osteoarthritis of right knee 08/26/2016  . Primary osteoarthritis of right knee 08/25/2016  . Status post revision of total hip 04/24/2016  . ED (erectile dysfunction) of organic origin 07/01/2012  . Malignant neoplasm of prostate ([place]) 07/01/2012  . Microscopic hematuria 07/01/2012  . Cervical stenosis of spinal canal 01/10/2012    Gabriela Eves, PT, DPT 03/11/2019, 12:54 PM  [facility] [address] [place], [place], [address] Phone: [phone]   Fax:  [phone]  Name: Jonathan Harvey MRN: [id number] Date of Birth: Mar 02, 1946

## 2019-03-15 ENCOUNTER — Other Ambulatory Visit: Payer: Self-pay

## 2019-03-15 ENCOUNTER — Ambulatory Visit: Payer: Medicare PPO | Admitting: Physical Therapy

## 2019-03-15 DIAGNOSIS — M6281 Muscle weakness (generalized): Secondary | ICD-10-CM

## 2019-03-15 DIAGNOSIS — M25511 Pain in right shoulder: Secondary | ICD-10-CM | POA: Diagnosis not present

## 2019-03-15 DIAGNOSIS — G8929 Other chronic pain: Secondary | ICD-10-CM

## 2019-03-15 DIAGNOSIS — M25611 Stiffness of right shoulder, not elsewhere classified: Secondary | ICD-10-CM

## 2019-03-15 NOTE — Therapy (Signed)
[facility] [place], [place], [address] Phone: [phone]   Fax:  [phone]  Physical Therapy Treatment  Patient Details  Name: Jonathan Harvey MRN: [id number] Date of Birth: 12/10/1945 Referring Provider (PT): Grier Mitts, PA-C   Encounter Date: 03/15/2019  PT End of Session - 03/15/19 1137    Visit Number  6    Number of Visits  12    Date for PT Re-Evaluation  04/13/19    Authorization Type  FOTO; Progress note every 10th visit; KX modifier at 15th visit    PT Start Time  1115    PT Stop Time  1206    PT Time Calculation (min)  51 min    Activity Tolerance  Patient tolerated treatment well    Behavior During Therapy  [facility] for tasks assessed/performed       Past Medical History:  Diagnosis Date  . Arthritis    "all over" (12/25/2016)  . Chronic lower back pain    "severe" (12/25/2016)  . Hepatitis B 2008  . History of kidney stones   . Hypertension   . Hypertriglyceridemia   . Prostate cancer ([place]) 10/2006   s/p robotic prostatectomy   . Renal cell carcinoma, right ([place])   . Spinal stenosis     Past Surgical History:  Procedure Laterality Date  . CARPAL TUNNEL RELEASE Right 2010  . CYST EXCISION Left    elbow  . FEMUR SURGERY Left ~ 1962   "femur was growing crooked; broke it to straighten it out; body cast for ~ 4 months"  . FINGER SURGERY Right 1987   right thumb and index finger (pt denies this hx on 12/25/2016)  . FRACTURE SURGERY    . INGUINAL HERNIA REPAIR Left 2008  . JOINT REPLACEMENT    . MASTOIDECTOMY  1982  . ORIF GREATER TROCHANTERIC FRACTURE Left 12/25/2016    ACETABULAR REVISION/notes 12/25/2016  . PROSTATECTOMY  10/2006  . ROBOTIC ASSITED PARTIAL NEPHRECTOMY Right 01/30/2017   Procedure: ROBOTIC ASSITED PARTIAL NEPHRECTOMY;  Surgeon: Ardis Hughs, MD;  [facility];  Service: Urology;  Laterality: Right;  . TOTAL HIP ARTHROPLASTY Left 2002  . TOTAL HIP REVISION Left  04/24/2016   Procedure: TOTAL HIP REVISION;  Surgeon: [name], MD;  Location: [place];  Service: Orthopedics;  Laterality: Left;  . TOTAL HIP REVISION Left 12/25/2016   Procedure: OPEN REDUCTION INTERNAL FIXATION GREATER TROCHANTERIC FRACTURE, ACETABULAR REVISION;  Surgeon: [name], MD;  Location: [place];  Service: Orthopedics;  Laterality: Left;  . TOTAL KNEE ARTHROPLASTY Right 08/26/2016   Procedure: RIGHT TOTAL KNEE ARTHROPLASTY;  Surgeon: [name], MD;  Location: [place];  Service: Orthopedics;  Laterality: Right;    There were no vitals filed for this visit.  Subjective Assessment - 03/15/19 1132    Subjective  COVID-19 screening performed upon arrival. Patient reports he can shave with easier with his right arm/shoulder.    Pertinent History  controlled HTN, chronic lower back pain    Limitations  Lifting;House hold activities    Diagnostic tests  MRI: RTC tendon tear per patient    Patient Stated Goals  train for strengthening    Currently in Pain?  Yes   none at rest        [facility] PT Assessment - 03/15/19 0001      Assessment   Medical Diagnosis  Right shoulder irreparable RTC    Referring Provider (PT)  Grier Mitts, PA-C    Hand Dominance  Right    Next MD Visit  03/31/2019    Prior Therapy  no      Observation/Other Assessments   Focus on Therapeutic Outcomes (FOTO)   47% limitation                   [facility] Adult PT Treatment/Exercise - 03/15/19 0001      Exercises   Exercises  Shoulder      Shoulder Exercises: Standing   External Rotation  --    Extension  Strengthening;Right;20 reps;Theraband    Theraband Level (Shoulder Extension)  Level 1 (Yellow)    Row  Strengthening;Right;20 reps;Theraband    Theraband Level (Shoulder Row)  Level 1 (Yellow)    Other Standing Exercises  wall ladder to 30 x20      Shoulder Exercises: Pulleys   Flexion  5 minutes      Shoulder Exercises: ROM/Strengthening   UBE (Upper Arm Bike)  120 RPM x10 min     Ranger  standing ranger flexion, cw, ccw, x3 mins each      Modalities   Modalities  Electrical Stimulation;Moist Heat      Moist Heat Therapy   Number Minutes Moist Heat  10 Minutes    Moist Heat Location  Shoulder      Electrical Stimulation   Electrical Stimulation Location  R shoulder    Electrical Stimulation Action  IFC    Electrical Stimulation Parameters  80-150 hz x10 mins    Electrical Stimulation Goals  Pain                  PT Long Term Goals - 03/15/19 1205      PT LONG TERM GOAL #1   Title  Patient will be independent with HEP    Time  6    Period  Weeks    Status  Achieved      PT LONG TERM GOAL #2   Title  Patient will report ability to perform ADLs with right shoulder pain less than or equal to 4/10.    Baseline  --    Time  6    Status  On-going      PT LONG TERM GOAL #3   Title  Patient will demonstrate right 45+ degrees of right shoulder ER AROM to improve ability dressing and drying back.    Time  6    Period  Weeks    Status  On-going            Plan - 03/15/19 1210    Clinical Impression Statement  Patient responded well to therapy session but with compensatory motions with circles. Patient instructed to reduce the size of cirlces to prevent trunk lean but patient continued with motions. No adverse affects upon removal of modalities. Patient's FOTO limitiation 47%.    Personal Factors and Comorbidities  Age;Time since onset of injury/illness/exacerbation;Comorbidity 1    Comorbidities  HTN    Examination-Activity Limitations  Reach Overhead;Carry;Lift;Dressing    Stability/Clinical Decision Making  Stable/Uncomplicated    Clinical Decision Making  Low    Rehab Potential  Fair    PT Frequency  2x / week    PT Duration  6 weeks    PT Treatment/Interventions  ADLs/Self Care Home Management;Electrical Stimulation;Iontophoresis 4mg /ml Dexamethasone;Moist Heat;Ultrasound;Patient/family education;Manual techniques;Passive range of  motion;Therapeutic activities;Therapeutic exercise;Vasopneumatic Device;Cryotherapy    PT Next Visit Plan  AAROM and progress strengthening as tolerable,  Modalities PRN for pain relief.    PT Home Exercise Plan  4 way isometrics    Consulted and Agree with Plan of Care  Patient       Patient will benefit from skilled therapeutic intervention in order to improve the following deficits and impairments:  Pain, Postural dysfunction, Decreased strength, Decreased range of motion, Decreased activity tolerance, Impaired UE functional use  Visit Diagnosis: Chronic right shoulder pain  Stiffness of right shoulder, not elsewhere classified  Muscle weakness (generalized)     Problem List Patient Active Problem List   Diagnosis Date Noted  . Renal cell carcinoma of right kidney ([place]) 02/01/2017  . Postoperative anemia due to acute blood loss 02/01/2017  . Renal mass 01/30/2017  . 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 Primary osteoarthritis of left hip 12/25/2016  . Primary localized osteoarthritis of right knee 08/26/2016  . Primary osteoarthritis of right knee 08/25/2016  . Status post revision of total hip 04/24/2016  . ED (erectile dysfunction) of organic origin 07/01/2012  . Malignant neoplasm of prostate ([place]) 07/01/2012  . Microscopic hematuria 07/01/2012  . Cervical stenosis of spinal canal 01/10/2012    Gabriela Eves PT, DPT 03/15/2019, 12:23 PM  [facility] [address], [place], [address] Phone: [phone]   Fax:  [phone]  Name: GUHAN CHIZMAR MRN: [id number] Date of Birth: Aug 02, 1945

## 2019-03-18 ENCOUNTER — Ambulatory Visit: Payer: Medicare PPO | Admitting: Physical Therapy

## 2019-03-18 ENCOUNTER — Encounter: Payer: Self-pay | Admitting: Physical Therapy

## 2019-03-18 ENCOUNTER — Other Ambulatory Visit: Payer: Self-pay

## 2019-03-18 DIAGNOSIS — M25511 Pain in right shoulder: Secondary | ICD-10-CM | POA: Diagnosis not present

## 2019-03-18 DIAGNOSIS — M6281 Muscle weakness (generalized): Secondary | ICD-10-CM

## 2019-03-18 DIAGNOSIS — M25611 Stiffness of right shoulder, not elsewhere classified: Secondary | ICD-10-CM

## 2019-03-18 DIAGNOSIS — G8929 Other chronic pain: Secondary | ICD-10-CM

## 2019-03-18 NOTE — Therapy (Signed)
[facility] [place], [place], [address] Phone: [phone]   Fax:  [phone]  Physical Therapy Treatment  Patient Details  Name: Jonathan Harvey MRN: [id number] Date of Birth: 04-03-1945 Referring Provider (PT): Grier Mitts, [place]   Encounter Date: 03/18/2019  PT End of Session - 03/18/19 1207    Visit Number  7    Number of Visits  12    Date for PT Re-Evaluation  04/13/19    Authorization Type  FOTO; Progress note every 10th visit; KX modifier at 15th visit    PT Start Time  1120    PT Stop Time  1210    PT Time Calculation (min)  50 min    Activity Tolerance  Patient tolerated treatment well    Behavior During Therapy  [facility] for tasks assessed/performed       Past Medical History:  Diagnosis Date  . Arthritis    "all over" (12/25/2016)  . Chronic lower back pain    "severe" (12/25/2016)  . Hepatitis B 2008  . History of kidney stones   . Hypertension   . Hypertriglyceridemia   . Prostate cancer ([place]) 10/2006   s/p robotic prostatectomy   . Renal cell carcinoma, right ([place])   . Spinal stenosis     Past Surgical History:  Procedure Laterality Date  . CARPAL TUNNEL RELEASE Right 2010  . CYST EXCISION Left    elbow  . FEMUR SURGERY Left ~ 1962   "femur was growing crooked; broke it to straighten it out; body cast for ~ 4 months"  . FINGER SURGERY Right 1987   right thumb and index finger (pt denies this hx on 12/25/2016)  . FRACTURE SURGERY    . INGUINAL HERNIA REPAIR Left 2008  . JOINT REPLACEMENT    . MASTOIDECTOMY  1982  . ORIF GREATER TROCHANTERIC FRACTURE Left 12/25/2016    ACETABULAR REVISION/notes 12/25/2016  . PROSTATECTOMY  10/2006  . ROBOTIC ASSITED PARTIAL NEPHRECTOMY Right 01/30/2017   Procedure: ROBOTIC ASSITED PARTIAL NEPHRECTOMY;  Surgeon: Ardis Hughs, MD;  [facility];  Service: Urology;  Laterality: Right;  . TOTAL HIP ARTHROPLASTY Left 2002  . TOTAL HIP REVISION Left  04/24/2016   Procedure: TOTAL HIP REVISION;  Surgeon: [name], MD;  Location: [place];  Service: Orthopedics;  Laterality: Left;  . TOTAL HIP REVISION Left 12/25/2016   Procedure: OPEN REDUCTION INTERNAL FIXATION GREATER TROCHANTERIC FRACTURE, ACETABULAR REVISION;  Surgeon: [name], MD;  Location: [place];  Service: Orthopedics;  Laterality: Left;  . TOTAL KNEE ARTHROPLASTY Right 08/26/2016   Procedure: RIGHT TOTAL KNEE ARTHROPLASTY;  Surgeon: [name], MD;  Location: [place];  Service: Orthopedics;  Laterality: Right;    There were no vitals filed for this visit.  Subjective Assessment - 03/18/19 1124    Subjective  COVID-19 screening performed upon arrival. Patient reports feeling good yesterday but started getting more pain in the right shoulder towards the evening.    Pertinent History  controlled HTN, chronic lower back pain    Limitations  Lifting;House hold activities    Diagnostic tests  MRI: RTC tendon tear per patient    Patient Stated Goals  train for strengthening    Currently in Pain?  Yes    Pain Score  8     Pain Location  Shoulder    Pain Orientation  Right    Pain Descriptors / Indicators  Discomfort    Pain Type  Acute pain  Pain Onset  More than a month ago    Pain Frequency  Intermittent         [facility] PT Assessment - 03/18/19 0001      Assessment   Medical Diagnosis  Right shoulder irreparable RTC    Referring Provider (PT)  Grier Mitts, PA-C    Hand Dominance  Right    Next MD Visit  03/31/2019    Prior Therapy  no                   [facility] Adult PT Treatment/Exercise - 03/18/19 0001      Exercises   Exercises  Shoulder      Shoulder Exercises: Prone   Other Prone Exercises  bent over row, elbow extension and horizontal abduction 1# x20      Shoulder Exercises: Standing   Internal Rotation  Strengthening;20 reps;Theraband;Right    Theraband Level (Shoulder Internal Rotation)  Level 1 (Yellow)    Extension   Strengthening;Right;20 reps;Theraband    Theraband Level (Shoulder Extension)  Level 1 (Yellow)    Row  Strengthening;Right;20 reps;Theraband    Theraband Level (Shoulder Row)  Level 1 (Yellow)      Shoulder Exercises: Pulleys   Flexion  5 minutes      Shoulder Exercises: ROM/Strengthening   UBE (Upper Arm Bike)  120 RPM x10 min      Modalities   Modalities  Electrical Stimulation;Moist Heat      Moist Heat Therapy   Number Minutes Moist Heat  10 Minutes    Moist Heat Location  Shoulder      Electrical Stimulation   Electrical Stimulation Location  R shoulder    Electrical Stimulation Action  IFC    Electrical Stimulation Parameters  80-150 hz x10 mins    Electrical Stimulation Goals  Pain                  PT Long Term Goals - 03/15/19 1205      PT LONG TERM GOAL #1   Title  Patient will be independent with HEP    Time  6    Period  Weeks    Status  Achieved      PT LONG TERM GOAL #2   Title  Patient will report ability to perform ADLs with right shoulder pain less than or equal to 4/10.    Baseline  --    Time  6    Status  On-going      PT LONG TERM GOAL #3   Title  Patient will demonstrate right 45+ degrees of right shoulder ER AROM to improve ability dressing and drying back.    Time  6    Period  Weeks    Status  On-going            Plan - 03/18/19 1211    Clinical Impression Statement  Patient responded fairly well with the progression of TE. Patient required tactile cuing for proper form and technique and was able to demonstrated improved form for remaining reps. Patient discussed how ADLs have become easier  particularly with shaving and washing his hair. No adverse affects upon the removal of modalities.    Personal Factors and Comorbidities  Age;Time since onset of injury/illness/exacerbation;Comorbidity 1    Comorbidities  HTN    Examination-Activity Limitations  Reach Overhead;Carry;Lift;Dressing    Stability/Clinical Decision Making   Stable/Uncomplicated    Clinical Decision Making  Low    Rehab Potential  Fair  PT Frequency  2x / week    PT Duration  6 weeks    PT Treatment/Interventions  ADLs/Self Care Home Management;Electrical Stimulation;Iontophoresis 4mg /ml Dexamethasone;Moist Heat;Ultrasound;Patient/family education;Manual techniques;Passive range of motion;Therapeutic activities;Therapeutic exercise;Vasopneumatic Device;Cryotherapy    PT Next Visit Plan  continue with plan of care, progress strengthening as tolerable,  Modalities PRN for pain relief.    Consulted and Agree with Plan of Care  Patient       Patient will benefit from skilled therapeutic intervention in order to improve the following deficits and impairments:  Pain, Postural dysfunction, Decreased strength, Decreased range of motion, Decreased activity tolerance, Impaired UE functional use  Visit Diagnosis: Chronic right shoulder pain  Stiffness of right shoulder, not elsewhere classified  Muscle weakness (generalized)     Problem List Patient Active Problem List   Diagnosis Date Noted  . Renal cell carcinoma of right kidney ([place]) 02/01/2017  . Postoperative anemia due to acute blood loss 02/01/2017  . Renal mass 01/30/2017  . 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 Primary osteoarthritis of left hip 12/25/2016  . Primary localized osteoarthritis of right knee 08/26/2016  . Primary osteoarthritis of right knee 08/25/2016  . Status post revision of total hip 04/24/2016  . ED (erectile dysfunction) of organic origin 07/01/2012  . Malignant neoplasm of prostate ([place]) 07/01/2012  . Microscopic hematuria 07/01/2012  . Cervical stenosis of spinal canal 01/10/2012    Gabriela Eves, PT, DPT 03/18/2019, 12:15 PM  [facility] [address] [place], [place], [address] Phone: [phone]   Fax:  [phone]  Name: Jonathan Harvey MRN: [id number] Date of Birth: [birth year]

## 2019-03-22 ENCOUNTER — Other Ambulatory Visit: Payer: Self-pay

## 2019-03-22 ENCOUNTER — Ambulatory Visit: Payer: Medicare PPO | Admitting: Physical Therapy

## 2019-03-22 DIAGNOSIS — M25511 Pain in right shoulder: Secondary | ICD-10-CM

## 2019-03-22 DIAGNOSIS — M25611 Stiffness of right shoulder, not elsewhere classified: Secondary | ICD-10-CM

## 2019-03-22 DIAGNOSIS — M25552 Pain in left hip: Secondary | ICD-10-CM

## 2019-03-22 DIAGNOSIS — M6281 Muscle weakness (generalized): Secondary | ICD-10-CM

## 2019-03-22 DIAGNOSIS — G8929 Other chronic pain: Secondary | ICD-10-CM

## 2019-03-22 NOTE — Therapy (Signed)
[facility] [place], [place], [address] Phone: [phone]   Fax:  [phone]  Physical Therapy Treatment  Patient Details  Name: Jonathan Harvey MRN: [id number] Date of Birth: 1945-04-10 Referring Provider (PT): Grier Mitts, [place]   Encounter Date: 03/22/2019  PT End of Session - 03/22/19 1212    Visit Number  8    Number of Visits  12    Date for PT Re-Evaluation  04/13/19    Authorization Type  FOTO; Progress note every 10th visit; KX modifier at 15th visit    PT Start Time  1122    PT Stop Time  1215    PT Time Calculation (min)  53 min    Activity Tolerance  Patient tolerated treatment well    Behavior During Therapy  [facility] for tasks assessed/performed       Past Medical History:  Diagnosis Date  . Arthritis    "all over" (12/25/2016)  . Chronic lower back pain    "severe" (12/25/2016)  . Hepatitis B 2008  . History of kidney stones   . Hypertension   . Hypertriglyceridemia   . Prostate cancer ([place]) 10/2006   s/p robotic prostatectomy   . Renal cell carcinoma, right ([place])   . Spinal stenosis     Past Surgical History:  Procedure Laterality Date  . CARPAL TUNNEL RELEASE Right 2010  . CYST EXCISION Left    elbow  . FEMUR SURGERY Left ~ 1962   "femur was growing crooked; broke it to straighten it out; body cast for ~ 4 months"  . FINGER SURGERY Right 1987   right thumb and index finger (pt denies this hx on 12/25/2016)  . FRACTURE SURGERY    . INGUINAL HERNIA REPAIR Left 2008  . JOINT REPLACEMENT    . MASTOIDECTOMY  1982  . ORIF GREATER TROCHANTERIC FRACTURE Left 12/25/2016    ACETABULAR REVISION/notes 12/25/2016  . PROSTATECTOMY  10/2006  . ROBOTIC ASSITED PARTIAL NEPHRECTOMY Right 01/30/2017   Procedure: ROBOTIC ASSITED PARTIAL NEPHRECTOMY;  Surgeon: Ardis Hughs, MD;  [facility];  Service: Urology;  Laterality: Right;  . TOTAL HIP ARTHROPLASTY Left 2002  . TOTAL HIP REVISION Left  04/24/2016   Procedure: TOTAL HIP REVISION;  Surgeon: [name], MD;  Location: [place];  Service: Orthopedics;  Laterality: Left;  . TOTAL HIP REVISION Left 12/25/2016   Procedure: OPEN REDUCTION INTERNAL FIXATION GREATER TROCHANTERIC FRACTURE, ACETABULAR REVISION;  Surgeon: [name], MD;  Location: [place];  Service: Orthopedics;  Laterality: Left;  . TOTAL KNEE ARTHROPLASTY Right 08/26/2016   Procedure: RIGHT TOTAL KNEE ARTHROPLASTY;  Surgeon: [name], MD;  Location: [place];  Service: Orthopedics;  Laterality: Right;    There were no vitals filed for this visit.  Subjective Assessment - 03/22/19 1211    Subjective  COVID-19 screen performed prior to patient entering clinic.  I'm getting better.    Patient is accompained by:  Interpreter    Pertinent History  controlled HTN, chronic lower back pain    Limitations  Lifting;House hold activities    Diagnostic tests  MRI: RTC tendon tear per patient    Patient Stated Goals  train for strengthening    Currently in Pain?  Yes    Pain Score  7     Pain Location  Shoulder    Pain Orientation  Right    Pain Descriptors / Indicators  Discomfort    Pain Type  Acute pain  [facility] Adult PT Treatment/Exercise - 03/22/19 0001      Shoulder Exercises: Standing   Other Standing Exercises  RW4:  Mini-punches with yellow band, ER limited ROM with yellow band, extension and IR with red band.  All motions performed to fatigue.      Shoulder Exercises: Pulleys   Flexion  5 minutes      Shoulder Exercises: ROM/Strengthening   UBE (Upper Arm Bike)  120 RPM's x 10 minutes.    Ranger  Standing UE Ranger x 5 minutes into flexion, circles (CW and CCW) and ER.      Modalities   Modalities  Electrical Stimulation;Moist Heat      Moist Heat Therapy   Number Minutes Moist Heat  15 Minutes    Moist Heat Location  --   Right shoulder.     Acupuncturist Location  Right shoulder.     Electrical Stimulation Action  IFC    Electrical Stimulation Parameters  80-150 Hz x 15 minutes.    Electrical Stimulation Goals  Pain                  PT Long Term Goals - 03/15/19 1205      PT LONG TERM GOAL #1   Title  Patient will be independent with HEP    Time  6    Period  Weeks    Status  Achieved      PT LONG TERM GOAL #2   Title  Patient will report ability to perform ADLs with right shoulder pain less than or equal to 4/10.    Baseline  --    Time  6    Status  On-going      PT LONG TERM GOAL #3   Title  Patient will demonstrate right 45+ degrees of right shoulder ER AROM to improve ability dressing and drying back.    Time  6    Period  Weeks    Status  On-going            Plan - 03/22/19 1222    Clinical Impression Statement  Excellent job today.  Added mini-punches with yellow theraband and limited range ER with yellow theraband.  Patient performed ther ex without complaint.    Personal Factors and Comorbidities  Age;Time since onset of injury/illness/exacerbation;Comorbidity 1    Comorbidities  HTN    Examination-Activity Limitations  Reach Overhead;Carry;Lift;Dressing    Stability/Clinical Decision Making  Stable/Uncomplicated    Rehab Potential  Fair    PT Frequency  2x / week    PT Duration  6 weeks    PT Treatment/Interventions  ADLs/Self Care Home Management;Electrical Stimulation;Iontophoresis 4mg /ml Dexamethasone;Moist Heat;Ultrasound;Patient/family education;Manual techniques;Passive range of motion;Therapeutic activities;Therapeutic exercise;Vasopneumatic Device;Cryotherapy    PT Next Visit Plan  continue with plan of care, progress strengthening as tolerable,  Modalities PRN for pain relief.    PT Home Exercise Plan  4 way isometrics    Consulted and Agree with Plan of Care  Patient       Patient will benefit from skilled therapeutic intervention in order to improve the following deficits and impairments:  Pain, Postural  dysfunction, Decreased strength, Decreased range of motion, Decreased activity tolerance, Impaired UE functional use  Visit Diagnosis: Chronic right shoulder pain  Stiffness of right shoulder, not elsewhere classified  Muscle weakness (generalized)  Pain in left hip     Problem List Patient Active Problem List   Diagnosis Date Noted  . Renal cell  carcinoma of right kidney ([place]) 02/01/2017  . Postoperative anemia due to acute blood loss 02/01/2017  . Renal mass 01/30/2017  . 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 Primary osteoarthritis of left hip 12/25/2016  . Primary localized osteoarthritis of right knee 08/26/2016  . Primary osteoarthritis of right knee 08/25/2016  . Status post revision of total hip 04/24/2016  . ED (erectile dysfunction) of organic origin 07/01/2012  . Malignant neoplasm of prostate ([place]) 07/01/2012  . Microscopic hematuria 07/01/2012  . Cervical stenosis of spinal canal 01/10/2012    Rowe Warman, [place]  MPT 03/22/2019, 12:25 PM  [facility] [address] [place], [place], [address] Phone: [phone]   Fax:  [phone]  Name: Jonathan Harvey MRN: [id number] Date of Birth: Aug 01, 1945

## 2019-03-25 ENCOUNTER — Ambulatory Visit: Payer: Medicare PPO | Admitting: Physical Therapy

## 2019-03-25 ENCOUNTER — Other Ambulatory Visit: Payer: Self-pay

## 2019-03-25 DIAGNOSIS — M6281 Muscle weakness (generalized): Secondary | ICD-10-CM

## 2019-03-25 DIAGNOSIS — M25511 Pain in right shoulder: Secondary | ICD-10-CM | POA: Diagnosis not present

## 2019-03-25 DIAGNOSIS — G8929 Other chronic pain: Secondary | ICD-10-CM

## 2019-03-25 DIAGNOSIS — M25611 Stiffness of right shoulder, not elsewhere classified: Secondary | ICD-10-CM

## 2019-03-25 NOTE — Therapy (Signed)
[facility] [place], [place], [address] Phone: [phone]   Fax:  [phone]  Physical Therapy Treatment  Patient Details  Name: Jonathan Harvey MRN: [id number] Date of Birth: 1945/06/11 Referring Provider (PT): Grier Mitts, [place]   Encounter Date: 03/25/2019  PT End of Session - 03/25/19 1139    Visit Number  9    Number of Visits  12    Date for PT Re-Evaluation  04/13/19    Authorization Type  FOTO; Progress note every 10th visit; KX modifier at 15th visit    PT Start Time  1117    PT Stop Time  1210    PT Time Calculation (min)  53 min    Activity Tolerance  Patient tolerated treatment well    Behavior During Therapy  [facility] for tasks assessed/performed       Past Medical History:  Diagnosis Date  . Arthritis    "all over" (12/25/2016)  . Chronic lower back pain    "severe" (12/25/2016)  . Hepatitis B 2008  . History of kidney stones   . Hypertension   . Hypertriglyceridemia   . Prostate cancer ([place]) 10/2006   s/p robotic prostatectomy   . Renal cell carcinoma, right ([place])   . Spinal stenosis     Past Surgical History:  Procedure Laterality Date  . CARPAL TUNNEL RELEASE Right 2010  . CYST EXCISION Left    elbow  . FEMUR SURGERY Left ~ 1962   "femur was growing crooked; broke it to straighten it out; body cast for ~ 4 months"  . FINGER SURGERY Right 1987   right thumb and index finger (pt denies this hx on 12/25/2016)  . FRACTURE SURGERY    . INGUINAL HERNIA REPAIR Left 2008  . JOINT REPLACEMENT    . MASTOIDECTOMY  1982  . ORIF GREATER TROCHANTERIC FRACTURE Left 12/25/2016    ACETABULAR REVISION/notes 12/25/2016  . PROSTATECTOMY  10/2006  . ROBOTIC ASSITED PARTIAL NEPHRECTOMY Right 01/30/2017   Procedure: ROBOTIC ASSITED PARTIAL NEPHRECTOMY;  Surgeon: Ardis Hughs, MD;  [facility];  Service: Urology;  Laterality: Right;  . TOTAL HIP ARTHROPLASTY Left 2002  . TOTAL HIP REVISION Left  04/24/2016   Procedure: TOTAL HIP REVISION;  Surgeon: [name], MD;  Location: [place];  Service: Orthopedics;  Laterality: Left;  . TOTAL HIP REVISION Left 12/25/2016   Procedure: OPEN REDUCTION INTERNAL FIXATION GREATER TROCHANTERIC FRACTURE, ACETABULAR REVISION;  Surgeon: [name], MD;  Location: [place];  Service: Orthopedics;  Laterality: Left;  . TOTAL KNEE ARTHROPLASTY Right 08/26/2016   Procedure: RIGHT TOTAL KNEE ARTHROPLASTY;  Surgeon: [name], MD;  Location: [place];  Service: Orthopedics;  Laterality: Right;    There were no vitals filed for this visit.      [facility] PT Assessment - 03/25/19 0001      Assessment   Medical Diagnosis  Right shoulder irreparable RTC    Referring Provider (PT)  Grier Mitts, PA-C    Hand Dominance  Right    Next MD Visit  03/31/2019    Prior Therapy  no                   [facility] Adult PT Treatment/Exercise - 03/25/19 0001      Exercises   Exercises  Shoulder      Shoulder Exercises: Standing   Other Standing Exercises  RW4:  Mini-punches with yellow band, ER limited ROM with yellow band, extension and IR with red  band.  All motions performed to fatigue.    Other Standing Exercises  right shoulder adduction green theraband x30      Shoulder Exercises: Pulleys   Flexion  5 minutes      Shoulder Exercises: ROM/Strengthening   UBE (Upper Arm Bike)  90 RPM's x8 minutes.    Other ROM/Strengthening Exercises  scapular wall slides x2 mins      Modalities   Modalities  Electrical Stimulation;Moist Heat      Moist Heat Therapy   Number Minutes Moist Heat  10 Minutes    Moist Heat Location  Shoulder      Electrical Stimulation   Electrical Stimulation Location  Right shoulder.    Electrical Stimulation Action  IFC    Electrical Stimulation Parameters  80-150 hz x10 mins    Electrical Stimulation Goals  Pain                  PT Long Term Goals - 03/15/19 1205      PT LONG TERM GOAL #1   Title  Patient  will be independent with HEP    Time  6    Period  Weeks    Status  Achieved      PT LONG TERM GOAL #2   Title  Patient will report ability to perform ADLs with right shoulder pain less than or equal to 4/10.    Baseline  --    Time  6    Status  On-going      PT LONG TERM GOAL #3   Title  Patient will demonstrate right 45+ degrees of right shoulder ER AROM to improve ability dressing and drying back.    Time  6    Period  Weeks    Status  On-going            Plan - 03/25/19 1252    Clinical Impression Statement  Patient responded well though compensatory motions were observed as fatigue set in. Patient responded well to the progression of UBE resistance. Patient has made progress with performing functional tasks though some tasks are difficulty. FOTO and goals next visit for progress note.    Personal Factors and Comorbidities  Age;Time since onset of injury/illness/exacerbation;Comorbidity 1    Comorbidities  HTN    Examination-Activity Limitations  Reach Overhead;Carry;Lift;Dressing    Stability/Clinical Decision Making  Stable/Uncomplicated    Clinical Decision Making  Low    Rehab Potential  Fair    PT Frequency  2x / week    PT Duration  6 weeks    PT Treatment/Interventions  ADLs/Self Care Home Management;Electrical Stimulation;Iontophoresis 4mg /ml Dexamethasone;Moist Heat;Ultrasound;Patient/family education;Manual techniques;Passive range of motion;Therapeutic activities;Therapeutic exercise;Vasopneumatic Device;Cryotherapy    PT Next Visit Plan  FOTO; progress note. continue with plan of care, progress strengthening as tolerable,  Modalities PRN for pain relief.    Consulted and Agree with Plan of Care  Patient       Patient will benefit from skilled therapeutic intervention in order to improve the following deficits and impairments:  Pain, Postural dysfunction, Decreased strength, Decreased range of motion, Decreased activity tolerance, Impaired UE functional  use  Visit Diagnosis: Chronic right shoulder pain  Stiffness of right shoulder, not elsewhere classified  Muscle weakness (generalized)     Problem List Patient Active Problem List   Diagnosis Date Noted  . Renal cell carcinoma of right kidney ([place]) 02/01/2017  . Postoperative anemia due to acute blood loss 02/01/2017  . Renal mass 01/30/2017  .  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 Primary osteoarthritis of left hip 12/25/2016  . Primary localized osteoarthritis of right knee 08/26/2016  . Primary osteoarthritis of right knee 08/25/2016  . Status post revision of total hip 04/24/2016  . ED (erectile dysfunction) of organic origin 07/01/2012  . Malignant neoplasm of prostate ([place]) 07/01/2012  . Microscopic hematuria 07/01/2012  . Cervical stenosis of spinal canal 01/10/2012    Gabriela Eves, PT, DPT 03/25/2019, 12:53 PM  [facility] [address] [place], [place], [address] Phone: [phone]   Fax:  [phone]  Name: Jonathan Harvey MRN: [id number] Date of Birth: September 19, 1945

## 2019-03-29 ENCOUNTER — Encounter: Payer: Self-pay | Admitting: Physical Therapy

## 2019-03-29 ENCOUNTER — Ambulatory Visit: Payer: Medicare PPO | Admitting: Physical Therapy

## 2019-03-29 ENCOUNTER — Other Ambulatory Visit: Payer: Self-pay

## 2019-03-29 DIAGNOSIS — M25511 Pain in right shoulder: Secondary | ICD-10-CM | POA: Diagnosis not present

## 2019-03-29 DIAGNOSIS — M6281 Muscle weakness (generalized): Secondary | ICD-10-CM

## 2019-03-29 DIAGNOSIS — M25611 Stiffness of right shoulder, not elsewhere classified: Secondary | ICD-10-CM

## 2019-03-29 DIAGNOSIS — G8929 Other chronic pain: Secondary | ICD-10-CM

## 2019-03-29 NOTE — Therapy (Signed)
[facility] [place], [place], [address] Phone: [phone]   Fax:  [phone]  Physical Therapy Treatment   Progress Note Reporting Period 02/23/2020 to 03/29/2019  See note below for Objective Data and Assessment of Progress/Goals. Patient has made gains towards goals and reported improvements in functional activities such as shaving and washing face and hair. Gabriela Eves, PT, DPT     Patient Details  Name: Jonathan Harvey MRN: [id number] Date of Birth: 04-Aug-1945 Referring Provider (PT): Grier Mitts, [place]   Encounter Date: 03/29/2019  PT End of Session - 03/29/19 1215    Visit Number  10    Number of Visits  12    Date for PT Re-Evaluation  04/13/19    Authorization Type  FOTO; Progress note every 10th visit; KX modifier at 15th visit    PT Start Time  1125    PT Stop Time  1215    PT Time Calculation (min)  50 min    Activity Tolerance  Patient tolerated treatment well    Behavior During Therapy  [facility] for tasks assessed/performed       Past Medical History:  Diagnosis Date  . Arthritis    "all over" (12/25/2016)  . Chronic lower back pain    "severe" (12/25/2016)  . Hepatitis B 2008  . History of kidney stones   . Hypertension   . Hypertriglyceridemia   . Prostate cancer ([place]) 10/2006   s/p robotic prostatectomy   . Renal cell carcinoma, right ([place])   . Spinal stenosis     Past Surgical History:  Procedure Laterality Date  . CARPAL TUNNEL RELEASE Right 2010  . CYST EXCISION Left    elbow  . FEMUR SURGERY Left ~ 1962   "femur was growing crooked; broke it to straighten it out; body cast for ~ 4 months"  . FINGER SURGERY Right 1987   right thumb and index finger (pt denies this hx on 12/25/2016)  . FRACTURE SURGERY    . INGUINAL HERNIA REPAIR Left 2008  . JOINT REPLACEMENT    . MASTOIDECTOMY  1982  . ORIF GREATER TROCHANTERIC FRACTURE Left 12/25/2016    ACETABULAR REVISION/notes  12/25/2016  . PROSTATECTOMY  10/2006  . ROBOTIC ASSITED PARTIAL NEPHRECTOMY Right 01/30/2017   Procedure: ROBOTIC ASSITED PARTIAL NEPHRECTOMY;  Surgeon: Ardis Hughs, MD;  [facility];  Service: Urology;  Laterality: Right;  . TOTAL HIP ARTHROPLASTY Left 2002  . TOTAL HIP REVISION Left 04/24/2016   Procedure: TOTAL HIP REVISION;  Surgeon: [name], MD;  Location: [place];  Service: Orthopedics;  Laterality: Left;  . TOTAL HIP REVISION Left 12/25/2016   Procedure: OPEN REDUCTION INTERNAL FIXATION GREATER TROCHANTERIC FRACTURE, ACETABULAR REVISION;  Surgeon: [name], MD;  Location: [place];  Service: Orthopedics;  Laterality: Left;  . TOTAL KNEE ARTHROPLASTY Right 08/26/2016   Procedure: RIGHT TOTAL KNEE ARTHROPLASTY;  Surgeon: [name], MD;  Location: [place];  Service: Orthopedics;  Laterality: Right;    There were no vitals filed for this visit.  Subjective Assessment - 03/29/19 1129    Subjective  COVID-19 screen performed prior to patient entering clinic. Reports improving with shoulder.    Pertinent History  controlled HTN, chronic lower back pain    Limitations  Lifting;House hold activities    Diagnostic tests  MRI: RTC tendon tear per patient    Patient Stated Goals  train for strengthening    Currently in Pain?  Yes    Pain Location  Shoulder    Pain Orientation  Right    Pain Descriptors / Indicators  Discomfort    Pain Type  Acute pain    Pain Onset  More than a month ago    Pain Frequency  Intermittent         [facility] PT Assessment - 03/29/19 0001      Assessment   Medical Diagnosis  Right shoulder irreparable RTC    Referring Provider (PT)  Grier Mitts, PA-C    Hand Dominance  Right    Next MD Visit  03/31/2019    Prior Therapy  no      Precautions   Precautions  None      Restrictions   Weight Bearing Restrictions  No                   [facility] Adult PT Treatment/Exercise - 03/29/19 0001      Shoulder Exercises: Standing    Protraction  Strengthening;Right;20 reps;Theraband    Theraband Level (Shoulder Protraction)  Level 1 (Yellow)    External Rotation  Strengthening;Left;20 reps;Theraband    Theraband Level (Shoulder External Rotation)  Level 1 (Yellow)    Internal Rotation  Strengthening;Right;20 reps;Theraband    Theraband Level (Shoulder Internal Rotation)  Level 2 (Red)    Extension  Strengthening;Right;20 reps;Theraband    Theraband Level (Shoulder Extension)  Level 2 (Red)    Row  Strengthening;Right;20 reps;Theraband    Theraband Level (Shoulder Row)  Level 2 (Red)      Shoulder Exercises: Pulleys   Flexion  5 minutes      Shoulder Exercises: ROM/Strengthening   UBE (Upper Arm Bike)  90 RPM's x8 minutes.      Modalities   Modalities  Electrical Stimulation;Moist Heat      Moist Heat Therapy   Number Minutes Moist Heat  10 Minutes    Moist Heat Location  Shoulder      Electrical Stimulation   Electrical Stimulation Location  Right shoulder.    Electrical Stimulation Action  Pre-Mod    Electrical Stimulation Parameters  80-150 hz x10 min    Electrical Stimulation Goals  Pain                  PT Long Term Goals - 03/15/19 1205      PT LONG TERM GOAL #1   Title  Patient will be independent with HEP    Time  6    Period  Weeks    Status  Achieved      PT LONG TERM GOAL #2   Title  Patient will report ability to perform ADLs with right shoulder pain less than or equal to 4/10.    Baseline  --    Time  6    Status  On-going      PT LONG TERM GOAL #3   Title  Patient will demonstrate right 45+ degrees of right shoulder ER AROM to improve ability dressing and drying back.    Time  6    Period  Weeks    Status  On-going            Plan - 03/29/19 1208    Clinical Impression Statement  Patient presented in clinic with reports of improvement in regards to R shoulder. Patient requires minimal assist in R shoulder resistive exercises due to fatigue. Normal modalities  response noted folowing removal of the modalities.    Personal Factors and Comorbidities  Age;Time since onset of injury/illness/exacerbation;Comorbidity 1  Comorbidities  HTN    Examination-Activity Limitations  Reach Overhead;Carry;Lift;Dressing    Stability/Clinical Decision Making  Stable/Uncomplicated    Rehab Potential  Fair    PT Frequency  2x / week    PT Duration  6 weeks    PT Treatment/Interventions  ADLs/Self Care Home Management;Electrical Stimulation;Iontophoresis 4mg /ml Dexamethasone;Moist Heat;Ultrasound;Patient/family education;Manual techniques;Passive range of motion;Therapeutic activities;Therapeutic exercise;Vasopneumatic Device;Cryotherapy    PT Next Visit Plan  FOTO; progress note. continue with plan of care, progress strengthening as tolerable,  Modalities PRN for pain relief.    PT Home Exercise Plan  4 way isometrics    Consulted and Agree with Plan of Care  Patient       Patient will benefit from skilled therapeutic intervention in order to improve the following deficits and impairments:  Pain, Postural dysfunction, Decreased strength, Decreased range of motion, Decreased activity tolerance, Impaired UE functional use  Visit Diagnosis: Chronic right shoulder pain  Stiffness of right shoulder, not elsewhere classified  Muscle weakness (generalized)     Problem List Patient Active Problem List   Diagnosis Date Noted  . Renal cell carcinoma of right kidney ([place]) 02/01/2017  . Postoperative anemia due to acute blood loss 02/01/2017  . Renal mass 01/30/2017  . 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 Primary osteoarthritis of left hip 12/25/2016  . Primary localized osteoarthritis of right knee 08/26/2016  . Primary osteoarthritis of right knee 08/25/2016  . Status post revision of total hip 04/24/2016  . ED (erectile dysfunction) of organic origin 07/01/2012  . Malignant neoplasm of prostate ([place]) 07/01/2012  . Microscopic hematuria  07/01/2012  . Cervical stenosis of spinal canal 01/10/2012    Standley Brooking, PTA 03/29/2019, 12:18 PM  [facility] [address] [place], [place], [address] Phone: [phone]   Fax:  [phone]  Name: MATS HEASTER MRN: [id number] Date of Birth: January 29, 1946

## 2019-04-01 ENCOUNTER — Encounter: Payer: Self-pay | Admitting: Physical Therapy

## 2019-04-01 ENCOUNTER — Other Ambulatory Visit: Payer: Self-pay

## 2019-04-01 ENCOUNTER — Ambulatory Visit: Payer: Medicare PPO | Admitting: Physical Therapy

## 2019-04-01 DIAGNOSIS — M25511 Pain in right shoulder: Secondary | ICD-10-CM | POA: Diagnosis not present

## 2019-04-01 DIAGNOSIS — M6281 Muscle weakness (generalized): Secondary | ICD-10-CM

## 2019-04-01 DIAGNOSIS — M25611 Stiffness of right shoulder, not elsewhere classified: Secondary | ICD-10-CM

## 2019-04-01 DIAGNOSIS — G8929 Other chronic pain: Secondary | ICD-10-CM

## 2019-04-01 NOTE — Therapy (Signed)
[facility] [place], [place], [address] Phone: [phone]   Fax:  [phone]  Physical Therapy Treatment  Patient Details  Name: Jonathan Harvey MRN: [id number] Date of Birth: Jun 09, 1945 Referring Provider (PT): Grier Mitts, [place]   Encounter Date: 04/01/2019  PT End of Session - 04/01/19 1139    Visit Number  11    Number of Visits  12    Date for PT Re-Evaluation  04/13/19    Authorization Type  FOTO; Progress note every 10th visit; KX modifier at 15th visit    PT Start Time  1117    PT Stop Time  1206    PT Time Calculation (min)  49 min    Activity Tolerance  Patient tolerated treatment well    Behavior During Therapy  [facility] for tasks assessed/performed       Past Medical History:  Diagnosis Date  . Arthritis    "all over" (12/25/2016)  . Chronic lower back pain    "severe" (12/25/2016)  . Hepatitis B 2008  . History of kidney stones   . Hypertension   . Hypertriglyceridemia   . Prostate cancer ([place]) 10/2006   s/p robotic prostatectomy   . Renal cell carcinoma, right ([place])   . Spinal stenosis     Past Surgical History:  Procedure Laterality Date  . CARPAL TUNNEL RELEASE Right 2010  . CYST EXCISION Left    elbow  . FEMUR SURGERY Left ~ 1962   "femur was growing crooked; broke it to straighten it out; body cast for ~ 4 months"  . FINGER SURGERY Right 1987   right thumb and index finger (pt denies this hx on 12/25/2016)  . FRACTURE SURGERY    . INGUINAL HERNIA REPAIR Left 2008  . JOINT REPLACEMENT    . MASTOIDECTOMY  1982  . ORIF GREATER TROCHANTERIC FRACTURE Left 12/25/2016    ACETABULAR REVISION/notes 12/25/2016  . PROSTATECTOMY  10/2006  . ROBOTIC ASSITED PARTIAL NEPHRECTOMY Right 01/30/2017   Procedure: ROBOTIC ASSITED PARTIAL NEPHRECTOMY;  Surgeon: Ardis Hughs, MD;  [facility];  Service: Urology;  Laterality: Right;  . TOTAL HIP ARTHROPLASTY Left 2002  . TOTAL HIP REVISION  Left 04/24/2016   Procedure: TOTAL HIP REVISION;  Surgeon: [name], MD;  Location: [place];  Service: Orthopedics;  Laterality: Left;  . TOTAL HIP REVISION Left 12/25/2016   Procedure: OPEN REDUCTION INTERNAL FIXATION GREATER TROCHANTERIC FRACTURE, ACETABULAR REVISION;  Surgeon: [name], MD;  Location: [place];  Service: Orthopedics;  Laterality: Left;  . TOTAL KNEE ARTHROPLASTY Right 08/26/2016   Procedure: RIGHT TOTAL KNEE ARTHROPLASTY;  Surgeon: [name], MD;  Location: [place];  Service: Orthopedics;  Laterality: Right;    There were no vitals filed for this visit.  Subjective Assessment - 04/01/19 1120    Subjective  COVID-19 screen performed prior to patient entering clinic. Reports soreness after possibly over doing HEP last night.    Pertinent History  controlled HTN, chronic lower back pain    Limitations  Lifting;House hold activities    Diagnostic tests  MRI: RTC tendon tear per patient    Patient Stated Goals  train for strengthening    Currently in Pain?  Yes    Pain Location  Shoulder    Pain Orientation  Right    Pain Descriptors / Indicators  Sore    Pain Type  Acute pain    Pain Onset  More than a month ago    Pain Frequency  Constant         [facility] PT Assessment - 04/01/19 0001      Assessment   Medical Diagnosis  Right shoulder irreparable RTC    Referring Provider (PT)  Grier Mitts, PA-C    Hand Dominance  Right    Prior Therapy  no      Precautions   Precautions  None      Restrictions   Weight Bearing Restrictions  No                   [facility] Adult PT Treatment/Exercise - 04/01/19 0001      Shoulder Exercises: Standing   Protraction  Strengthening;Right;20 reps;Theraband    Theraband Level (Shoulder Protraction)  Level 1 (Yellow)    External Rotation  Strengthening;Left;20 reps;Theraband    Theraband Level (Shoulder External Rotation)  Level 1 (Yellow)    Internal Rotation  Strengthening;Right;20 reps;Theraband    Theraband  Level (Shoulder Internal Rotation)  Level 2 (Red)    Extension  Strengthening;Right;20 reps;Theraband    Theraband Level (Shoulder Extension)  Level 2 (Red)    Row  Strengthening;Right;20 reps;Theraband    Theraband Level (Shoulder Row)  Level 2 (Red)      Shoulder Exercises: Pulleys   Flexion  5 minutes      Shoulder Exercises: ROM/Strengthening   UBE (Upper Arm Bike)  90 RPM's x8 minutes.    Wall Wash  into flex, CW and CCW circles x20 reps each      Modalities   Modalities  Electrical Stimulation;Moist Heat      Moist Heat Therapy   Number Minutes Moist Heat  15 Minutes    Moist Heat Location  Shoulder      Electrical Stimulation   Electrical Stimulation Location  R shoulder    Electrical Stimulation Action  IFC    Electrical Stimulation Parameters  80-150 hz x15 min    Electrical Stimulation Goals  Pain                  PT Long Term Goals - 03/15/19 1205      PT LONG TERM GOAL #1   Title  Patient will be independent with HEP    Time  6    Period  Weeks    Status  Achieved      PT LONG TERM GOAL #2   Title  Patient will report ability to perform ADLs with right shoulder pain less than or equal to 4/10.    Baseline  --    Time  6    Status  On-going      PT LONG TERM GOAL #3   Title  Patient will demonstrate right 45+ degrees of right shoulder ER AROM to improve ability dressing and drying back.    Time  6    Period  Weeks    Status  On-going            Plan - 04/01/19 1159    Clinical Impression Statement  Patient presented in clinic with reports of soreness from overdoing HEP last night. Patient reports not counting reps at home which may have contributed to soreness. Patient more limited with resisted R shoulder ER today and required mod assist from LUE. Normal modalities response noted folowing removal of the modalities.    Personal Factors and Comorbidities  Age;Time since onset of injury/illness/exacerbation;Comorbidity 1    Comorbidities   HTN    Examination-Activity Limitations  Reach Overhead;Carry;Lift;Dressing    Stability/Clinical Decision  Making  Stable/Uncomplicated    Rehab Potential  Fair    PT Frequency  2x / week    PT Duration  6 weeks    PT Treatment/Interventions  ADLs/Self Care Home Management;Electrical Stimulation;Iontophoresis 4mg /ml Dexamethasone;Moist Heat;Ultrasound;Patient/family education;Manual techniques;Passive range of motion;Therapeutic activities;Therapeutic exercise;Vasopneumatic Device;Cryotherapy    PT Next Visit Plan  FOTO; progress note. continue with plan of care, progress strengthening as tolerable,  Modalities PRN for pain relief.    PT Home Exercise Plan  4 way isometrics    Consulted and Agree with Plan of Care  Patient       Patient will benefit from skilled therapeutic intervention in order to improve the following deficits and impairments:  Pain, Postural dysfunction, Decreased strength, Decreased range of motion, Decreased activity tolerance, Impaired UE functional use  Visit Diagnosis: Chronic right shoulder pain  Stiffness of right shoulder, not elsewhere classified  Muscle weakness (generalized)     Problem List Patient Active Problem List   Diagnosis Date Noted  . Renal cell carcinoma of right kidney ([place]) 02/01/2017  . Postoperative anemia due to acute blood loss 02/01/2017  . Renal mass 01/30/2017  . 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 Primary osteoarthritis of left hip 12/25/2016  . Primary localized osteoarthritis of right knee 08/26/2016  . Primary osteoarthritis of right knee 08/25/2016  . Status post revision of total hip 04/24/2016  . ED (erectile dysfunction) of organic origin 07/01/2012  . Malignant neoplasm of prostate ([place]) 07/01/2012  . Microscopic hematuria 07/01/2012  . Cervical stenosis of spinal canal 01/10/2012    Standley Brooking, PTA 04/01/2019, 12:12 PM  [facility] [place], [place], [address] Phone: [phone]   Fax:  [phone]  Name: Jonathan Harvey MRN: [id number] Date of Birth: 1945/11/14

## 2019-04-05 ENCOUNTER — Ambulatory Visit: Payer: Medicare PPO | Attending: Orthopedic Surgery | Admitting: Physical Therapy

## 2019-04-05 ENCOUNTER — Other Ambulatory Visit: Payer: Self-pay

## 2019-04-05 ENCOUNTER — Encounter: Payer: Self-pay | Admitting: Physical Therapy

## 2019-04-05 DIAGNOSIS — M25611 Stiffness of right shoulder, not elsewhere classified: Secondary | ICD-10-CM | POA: Diagnosis present

## 2019-04-05 DIAGNOSIS — G8929 Other chronic pain: Secondary | ICD-10-CM | POA: Insufficient documentation

## 2019-04-05 DIAGNOSIS — M25511 Pain in right shoulder: Secondary | ICD-10-CM | POA: Insufficient documentation

## 2019-04-05 DIAGNOSIS — M6281 Muscle weakness (generalized): Secondary | ICD-10-CM | POA: Diagnosis present

## 2019-04-05 NOTE — Therapy (Signed)
[facility] [place], [place], [address] Phone: [phone]   Fax:  [phone]  Physical Therapy Treatment  Patient Details  Name: Jonathan Harvey MRN: [id number] Date of Birth: May 07, 1945 Referring Provider (PT): Grier Mitts, [place]   Encounter Date: 04/05/2019  PT End of Session - 04/05/19 1127    Visit Number  12    Number of Visits  12    Date for PT Re-Evaluation  04/13/19    Authorization Type  FOTO; Progress note every 10th visit; KX modifier at 15th visit    PT Start Time  1121    PT Stop Time  1205    PT Time Calculation (min)  44 min    Activity Tolerance  Patient tolerated treatment well    Behavior During Therapy  [facility] for tasks assessed/performed       Past Medical History:  Diagnosis Date  . Arthritis    "all over" (12/25/2016)  . Chronic lower back pain    "severe" (12/25/2016)  . Hepatitis B 2008  . History of kidney stones   . Hypertension   . Hypertriglyceridemia   . Prostate cancer ([place]) 10/2006   s/p robotic prostatectomy   . Renal cell carcinoma, right ([place])   . Spinal stenosis     Past Surgical History:  Procedure Laterality Date  . CARPAL TUNNEL RELEASE Right 2010  . CYST EXCISION Left    elbow  . FEMUR SURGERY Left ~ 1962   "femur was growing crooked; broke it to straighten it out; body cast for ~ 4 months"  . FINGER SURGERY Right 1987   right thumb and index finger (pt denies this hx on 12/25/2016)  . FRACTURE SURGERY    . INGUINAL HERNIA REPAIR Left 2008  . JOINT REPLACEMENT    . MASTOIDECTOMY  1982  . ORIF GREATER TROCHANTERIC FRACTURE Left 12/25/2016    ACETABULAR REVISION/notes 12/25/2016  . PROSTATECTOMY  10/2006  . ROBOTIC ASSITED PARTIAL NEPHRECTOMY Right 01/30/2017   Procedure: ROBOTIC ASSITED PARTIAL NEPHRECTOMY;  Surgeon: Ardis Hughs, MD;  [facility];  Service: Urology;  Laterality: Right;  . TOTAL HIP ARTHROPLASTY Left 2002  . TOTAL HIP REVISION Left  04/24/2016   Procedure: TOTAL HIP REVISION;  Surgeon: [name], MD;  Location: [place];  Service: Orthopedics;  Laterality: Left;  . TOTAL HIP REVISION Left 12/25/2016   Procedure: OPEN REDUCTION INTERNAL FIXATION GREATER TROCHANTERIC FRACTURE, ACETABULAR REVISION;  Surgeon: [name], MD;  Location: [place];  Service: Orthopedics;  Laterality: Left;  . TOTAL KNEE ARTHROPLASTY Right 08/26/2016   Procedure: RIGHT TOTAL KNEE ARTHROPLASTY;  Surgeon: [name], MD;  Location: [place];  Service: Orthopedics;  Laterality: Right;    There were no vitals filed for this visit.  Subjective Assessment - 04/05/19 1126    Subjective  COVID-19 screen performed prior to patient entering clinic. Has new order from MD.    Pertinent History  controlled HTN, chronic lower back pain    Limitations  Lifting;House hold activities    Diagnostic tests  MRI: RTC tendon tear per patient    Patient Stated Goals  train for strengthening    Currently in Pain?  Yes    Pain Score  6     Pain Location  Shoulder    Pain Orientation  Right    Pain Descriptors / Indicators  Discomfort    Pain Type  Acute pain    Pain Onset  More than a month ago  Pain Frequency  Constant         [facility] PT Assessment - 04/05/19 0001      Assessment   Medical Diagnosis  Right shoulder irreparable RTC    Referring Provider (PT)  Grier Mitts, PA-C    Hand Dominance  Right    Prior Therapy  no      Precautions   Precautions  None      Restrictions   Weight Bearing Restrictions  No      AROM   Overall AROM   Within functional limits for tasks performed    AROM Assessment Site  Shoulder    Right/Left Shoulder  Right    Right Shoulder Flexion  157 Degrees                   [facility] Adult PT Treatment/Exercise - 04/05/19 0001      Shoulder Exercises: Seated   Flexion  AROM;Right;20 reps    Abduction  AROM;Right;20 reps      Shoulder Exercises: Standing   Protraction  Strengthening;Right;20  reps;Theraband    Theraband Level (Shoulder Protraction)  Level 1 (Yellow)    External Rotation  Strengthening;Left;20 reps;Theraband    Theraband Level (Shoulder External Rotation)  Level 1 (Yellow)    Internal Rotation  Strengthening;Right;20 reps;Theraband    Theraband Level (Shoulder Internal Rotation)  Level 2 (Red)    Extension  Strengthening;Right;20 reps;Theraband    Theraband Level (Shoulder Extension)  Level 2 (Red)      Shoulder Exercises: Pulleys   Flexion  5 minutes      Shoulder Exercises: ROM/Strengthening   UBE (Upper Arm Bike)  90 RPM's x8 minutes.    Wall Wash  into flex, CW and CCW circles x20 reps each      Modalities   Modalities  Electrical Stimulation;Moist Heat      Moist Heat Therapy   Number Minutes Moist Heat  10 Minutes    Moist Heat Location  Shoulder      Electrical Stimulation   Electrical Stimulation Location  R shoulder    Electrical Stimulation Action  Pre-Mod    Electrical Stimulation Parameters  80-150 hz x10 min    Electrical Stimulation Goals  Pain                  PT Long Term Goals - 03/15/19 1205      PT LONG TERM GOAL #1   Title  Patient will be independent with HEP    Time  6    Period  Weeks    Status  Achieved      PT LONG TERM GOAL #2   Title  Patient will report ability to perform ADLs with right shoulder pain less than or equal to 4/10.    Baseline  --    Time  6    Status  On-going      PT LONG TERM GOAL #3   Title  Patient will demonstrate right 45+ degrees of right shoulder ER AROM to improve ability dressing and drying back.    Time  6    Period  Weeks    Status  On-going            Plan - 04/05/19 1157    Clinical Impression Statement  Patient arrived in clinic with his "typical" R shoulder pain rated at 6/10. Patient's R shoulder easily fatigued and greatest weakness with ER and protraction. Compensation of R shoulder noted more with muscle fatigue with trunk  lean. Normal modalities response  noted following removal of the modalities.    Personal Factors and Comorbidities  Age;Time since onset of injury/illness/exacerbation;Comorbidity 1    Comorbidities  HTN    Examination-Activity Limitations  Reach Overhead;Carry;Lift;Dressing    Stability/Clinical Decision Making  Stable/Uncomplicated    Rehab Potential  Fair    PT Frequency  2x / week    PT Duration  6 weeks    PT Treatment/Interventions  ADLs/Self Care Home Management;Electrical Stimulation;Iontophoresis 4mg /ml Dexamethasone;Moist Heat;Ultrasound;Patient/family education;Manual techniques;Passive range of motion;Therapeutic activities;Therapeutic exercise;Vasopneumatic Device;Cryotherapy    PT Next Visit Plan  FOTO; progress note. continue with plan of care, progress strengthening as tolerable,  Modalities PRN for pain relief.    PT Home Exercise Plan  4 way isometrics    Consulted and Agree with Plan of Care  Patient       Patient will benefit from skilled therapeutic intervention in order to improve the following deficits and impairments:  Pain, Postural dysfunction, Decreased strength, Decreased range of motion, Decreased activity tolerance, Impaired UE functional use  Visit Diagnosis: Chronic right shoulder pain  Stiffness of right shoulder, not elsewhere classified  Muscle weakness (generalized)     Problem List Patient Active Problem List   Diagnosis Date Noted  . Renal cell carcinoma of right kidney ([place]) 02/01/2017  . Postoperative anemia due to acute blood loss 02/01/2017  . Renal mass 01/30/2017  . 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 Primary osteoarthritis of left hip 12/25/2016  . Primary localized osteoarthritis of right knee 08/26/2016  . Primary osteoarthritis of right knee 08/25/2016  . Status post revision of total hip 04/24/2016  . ED (erectile dysfunction) of organic origin 07/01/2012  . Malignant neoplasm of prostate ([place]) 07/01/2012  . Microscopic hematuria 07/01/2012  .  Cervical stenosis of spinal canal 01/10/2012    Standley Brooking, PTA 04/05/2019, 12:10 PM  [facility] [address] [place], [place], [address] Phone: [phone]   Fax:  [phone]  Name: KASHTEN SHUEY MRN: [id number] Date of Birth: 07/14/1945

## 2019-04-08 ENCOUNTER — Other Ambulatory Visit: Payer: Self-pay

## 2019-04-08 ENCOUNTER — Encounter: Payer: Self-pay | Admitting: Physical Therapy

## 2019-04-08 ENCOUNTER — Ambulatory Visit: Payer: Medicare PPO | Admitting: Physical Therapy

## 2019-04-08 DIAGNOSIS — M6281 Muscle weakness (generalized): Secondary | ICD-10-CM

## 2019-04-08 DIAGNOSIS — M25611 Stiffness of right shoulder, not elsewhere classified: Secondary | ICD-10-CM

## 2019-04-08 DIAGNOSIS — M25511 Pain in right shoulder: Secondary | ICD-10-CM | POA: Diagnosis not present

## 2019-04-08 DIAGNOSIS — G8929 Other chronic pain: Secondary | ICD-10-CM

## 2019-04-08 NOTE — Therapy (Signed)
[facility] [place], [place], [address] Phone: [phone]   Fax:  [phone]  Physical Therapy Treatment  Patient Details  Name: Jonathan Harvey MRN: [id number] Date of Birth: [birth year] Referring Provider (PT): Grier Mitts, PA-C   Encounter Date: 04/08/2019  PT End of Session - 04/08/19 1210    Visit Number  13    Number of Visits  20    Date for PT Re-Evaluation  04/13/19    Authorization Type  FOTO; Progress note every 10th visit; KX modifier at 15th visit    PT Start Time  1118    PT Stop Time  1205    PT Time Calculation (min)  47 min    Activity Tolerance  Patient tolerated treatment well    Behavior During Therapy  Jonathan Harvey for tasks assessed/performed       Past Medical History:  Diagnosis Date  . Arthritis    "all over" (12/25/2016)  . Chronic lower back pain    "severe" (12/25/2016)  . Hepatitis B 2008  . History of kidney stones   . Hypertension   . Hypertriglyceridemia   . Prostate cancer ([place]) 10/2006   s/p robotic prostatectomy   . Renal cell carcinoma, right ([place])   . Spinal stenosis     Past Surgical History:  Procedure Laterality Date  . CARPAL TUNNEL RELEASE Right 2010  . CYST EXCISION Left    elbow  . FEMUR SURGERY Left ~ 1962   "femur was growing crooked; broke it to straighten it out; body cast for ~ 4 months"  . FINGER SURGERY Right 1987   right thumb and index finger (pt denies this hx on 12/25/2016)  . FRACTURE SURGERY    . INGUINAL HERNIA REPAIR Left 2008  . JOINT REPLACEMENT    . MASTOIDECTOMY  1982  . ORIF GREATER TROCHANTERIC FRACTURE Left 12/25/2016    ACETABULAR REVISION/notes 12/25/2016  . PROSTATECTOMY  10/2006  . ROBOTIC ASSITED PARTIAL NEPHRECTOMY Right 01/30/2017   Procedure: ROBOTIC ASSITED PARTIAL NEPHRECTOMY;  Surgeon: Ardis Hughs, MD;  [facility];  Service: Urology;  Laterality: Right;  . TOTAL HIP ARTHROPLASTY Left 2002  . TOTAL HIP REVISION Left  04/24/2016   Procedure: TOTAL HIP REVISION;  Surgeon: [name], MD;  Location: [place];  Service: Orthopedics;  Laterality: Left;  . TOTAL HIP REVISION Left 12/25/2016   Procedure: OPEN REDUCTION INTERNAL FIXATION GREATER TROCHANTERIC FRACTURE, ACETABULAR REVISION;  Surgeon: [name], MD;  Location: [place];  Service: Orthopedics;  Laterality: Left;  . TOTAL KNEE ARTHROPLASTY Right 08/26/2016   Procedure: RIGHT TOTAL KNEE ARTHROPLASTY;  Surgeon: [name], MD;  Location: [place];  Service: Orthopedics;  Laterality: Right;    There were no vitals filed for this visit.  Subjective Assessment - 04/08/19 1117    Subjective  COVID-19 screen performed prior to patient entering clinic. Reports more elbow pain today than his shoulder.    Pertinent History  controlled HTN, chronic lower back pain    Limitations  Lifting;House hold activities    Diagnostic tests  MRI: RTC tendon tear per patient    Patient Stated Goals  train for strengthening    Currently in Pain?  Yes    Pain Score  6     Pain Location  Shoulder    Pain Orientation  Right    Pain Descriptors / Indicators  Discomfort    Pain Type  Acute pain    Pain Onset  More than a  month ago    Pain Frequency  Constant    Multiple Pain Sites  Yes    Pain Score  8    Pain Location  Elbow    Pain Orientation  Right    Pain Descriptors / Indicators  Discomfort    Pain Type  Chronic pain    Pain Onset  Today    Pain Frequency  Constant         [facility] PT Assessment - 04/08/19 0001      Assessment   Medical Diagnosis  Right shoulder irreparable RTC    Referring Provider (PT)  Grier Mitts, PA-C    Hand Dominance  Right    Prior Therapy  no      Precautions   Precautions  None      Restrictions   Weight Bearing Restrictions  No                   [facility] Adult PT Treatment/Exercise - 04/08/19 0001      Shoulder Exercises: Prone   Retraction  Strengthening;Right;20 reps;Weights    Retraction Weight (lbs)  1     Flexion  Strengthening;Right;20 reps;Weights    Flexion Weight (lbs)  1    Extension  Strengthening;Right;20 reps;Weights    Extension Weight (lbs)  1    Horizontal ABduction 1  Strengthening;Right;20 reps;Weights    Horizontal ABduction 1 Weight (lbs)  1      Shoulder Exercises: Standing   Other Standing Exercises  1 lb weight to mid shelf 2x10 reps      Shoulder Exercises: Pulleys   Flexion  5 minutes      Shoulder Exercises: ROM/Strengthening   UBE (Upper Arm Bike)  60 RPM's x8 minutes.      Modalities   Modalities  Electrical Stimulation;Moist Heat      Moist Heat Therapy   Number Minutes Moist Heat  15 Minutes    Moist Heat Location  Shoulder      Electrical Stimulation   Electrical Stimulation Location  R shoulder    Electrical Stimulation Action  Pre-Mod    Electrical Stimulation Parameters  80-150 hz x15 min    Electrical Stimulation Goals  Pain                  PT Long Term Goals - 03/15/19 1205      PT LONG TERM GOAL #1   Title  Patient will be independent with HEP    Time  6    Period  Weeks    Status  Achieved      PT LONG TERM GOAL #2   Title  Patient will report ability to perform ADLs with right shoulder pain less than or equal to 4/10.    Baseline  --    Time  6    Status  On-going      PT LONG TERM GOAL #3   Title  Patient will demonstrate right 45+ degrees of right shoulder ER AROM to improve ability dressing and drying back.    Time  6    Period  Weeks    Status  On-going            Plan - 04/08/19 1213    Clinical Impression Statement  Patient presented in clinic with reports of more R elbow pain than shoulder pain. Patient compliant with HEP but due to previous observations of exercises elbow pain could possibly be related to compensatory strategies by patient. More functional strengthening  completed today with patient fatiguing primarily with antigravity shoulder flexion. Normal modalties response noted follwing removal of  the modalities.    Personal Factors and Comorbidities  Age;Time since onset of injury/illness/exacerbation;Comorbidity 1    Comorbidities  HTN    Examination-Activity Limitations  Reach Overhead;Carry;Lift;Dressing    Stability/Clinical Decision Making  Stable/Uncomplicated    Rehab Potential  Fair    PT Frequency  2x / week    PT Duration  6 weeks    PT Treatment/Interventions  ADLs/Self Care Home Management;Electrical Stimulation;Iontophoresis 4mg /ml Dexamethasone;Moist Heat;Ultrasound;Patient/family education;Manual techniques;Passive range of motion;Therapeutic activities;Therapeutic exercise;Vasopneumatic Device;Cryotherapy    PT Next Visit Plan  FOTO; progress note. continue with plan of care, progress strengthening as tolerable,  Modalities PRN for pain relief.    PT Home Exercise Plan  4 way isometrics    Consulted and Agree with Plan of Care  Patient       Patient will benefit from skilled therapeutic intervention in order to improve the following deficits and impairments:  Pain, Postural dysfunction, Decreased strength, Decreased range of motion, Decreased activity tolerance, Impaired UE functional use  Visit Diagnosis: Chronic right shoulder pain  Stiffness of right shoulder, not elsewhere classified  Muscle weakness (generalized)     Problem List Patient Active Problem List   Diagnosis Date Noted  . Renal cell carcinoma of right kidney ([place]) 02/01/2017  . Postoperative anemia due to acute blood loss 02/01/2017  . Renal mass 01/30/2017  . 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 Primary osteoarthritis of left hip 12/25/2016  . Primary localized osteoarthritis of right knee 08/26/2016  . Primary osteoarthritis of right knee 08/25/2016  . Status post revision of total hip 04/24/2016  . ED (erectile dysfunction) of organic origin 07/01/2012  . Malignant neoplasm of prostate ([place]) 07/01/2012  . Microscopic hematuria 07/01/2012  . Cervical stenosis of  spinal canal 01/10/2012    Standley Brooking, PTA 04/08/2019, 12:16 PM  [facility] [address] [place], [place], [address] Phone: [phone]   Fax:  [phone]  Name: Jonathan Harvey MRN: [id number] Date of Birth: 06-30-1945

## 2019-04-12 ENCOUNTER — Ambulatory Visit: Payer: Medicare PPO | Admitting: Physical Therapy

## 2019-04-12 ENCOUNTER — Other Ambulatory Visit: Payer: Self-pay

## 2019-04-12 ENCOUNTER — Encounter: Payer: Self-pay | Admitting: Physical Therapy

## 2019-04-12 DIAGNOSIS — M25611 Stiffness of right shoulder, not elsewhere classified: Secondary | ICD-10-CM

## 2019-04-12 DIAGNOSIS — M6281 Muscle weakness (generalized): Secondary | ICD-10-CM

## 2019-04-12 DIAGNOSIS — G8929 Other chronic pain: Secondary | ICD-10-CM

## 2019-04-12 DIAGNOSIS — M25511 Pain in right shoulder: Secondary | ICD-10-CM | POA: Diagnosis not present

## 2019-04-12 NOTE — Patient Instructions (Signed)
Progressive Resisted: External Rotation (Side-Lying)    Holding __0__ pound weight, towel under arm, raise right forearm toward ceiling. Keep elbow bent and at side. Repeat _10-20___ times per set. Do __1-2__ sets per session. Do __1__ sessions per day.

## 2019-04-12 NOTE — Therapy (Signed)
[facility] [place], [place], [address] Phone: [phone]   Fax:  [phone]  Physical Therapy Treatment  Patient Details  Name: Jonathan Harvey MRN: [id number] Date of Birth: 1945-06-14 Referring Provider (PT): Grier Mitts, PA-C   Encounter Date: 04/12/2019  PT End of Session - 04/12/19 1139    Visit Number  14    Number of Visits  20    Date for PT Re-Evaluation  04/13/19    Authorization Type  FOTO; Progress note every 10th visit; KX modifier at 15th visit    PT Start Time  1122    PT Stop Time  1210    PT Time Calculation (min)  48 min    Activity Tolerance  Patient tolerated treatment well    Behavior During Therapy  [facility] for tasks assessed/performed       Past Medical History:  Diagnosis Date  . Arthritis    "all over" (12/25/2016)  . Chronic lower back pain    "severe" (12/25/2016)  . Hepatitis B 2008  . History of kidney stones   . Hypertension   . Hypertriglyceridemia   . Prostate cancer ([place]) 10/2006   s/p robotic prostatectomy   . Renal cell carcinoma, right ([place])   . Spinal stenosis     Past Surgical History:  Procedure Laterality Date  . CARPAL TUNNEL RELEASE Right 2010  . CYST EXCISION Left    elbow  . FEMUR SURGERY Left ~ 1962   "femur was growing crooked; broke it to straighten it out; body cast for ~ 4 months"  . FINGER SURGERY Right 1987   right thumb and index finger (pt denies this hx on 12/25/2016)  . FRACTURE SURGERY    . INGUINAL HERNIA REPAIR Left 2008  . JOINT REPLACEMENT    . MASTOIDECTOMY  1982  . ORIF GREATER TROCHANTERIC FRACTURE Left 12/25/2016    ACETABULAR REVISION/notes 12/25/2016  . PROSTATECTOMY  10/2006  . ROBOTIC ASSITED PARTIAL NEPHRECTOMY Right 01/30/2017   Procedure: ROBOTIC ASSITED PARTIAL NEPHRECTOMY;  Surgeon: Ardis Hughs, MD;  [facility];  Service: Urology;  Laterality: Right;  . TOTAL HIP ARTHROPLASTY Left 2002  . TOTAL HIP REVISION Left  04/24/2016   Procedure: TOTAL HIP REVISION;  Surgeon: [name], MD;  Location: [place];  Service: Orthopedics;  Laterality: Left;  . TOTAL HIP REVISION Left 12/25/2016   Procedure: OPEN REDUCTION INTERNAL FIXATION GREATER TROCHANTERIC FRACTURE, ACETABULAR REVISION;  Surgeon: [name], MD;  Location: [place];  Service: Orthopedics;  Laterality: Left;  . TOTAL KNEE ARTHROPLASTY Right 08/26/2016   Procedure: RIGHT TOTAL KNEE ARTHROPLASTY;  Surgeon: [name], MD;  Location: [place];  Service: Orthopedics;  Laterality: Right;    There were no vitals filed for this visit.  Subjective Assessment - 04/12/19 1123    Subjective  COVID-19 screen performed prior to patient entering clinic. Patient arrived "doing well"    Pertinent History  controlled HTN, chronic lower back pain    Limitations  Lifting;House hold activities    Diagnostic tests  MRI: RTC tendon tear per patient    Patient Stated Goals  train for strengthening    Currently in Pain?  Yes    Pain Score  4     Pain Location  Shoulder    Pain Orientation  Right    Pain Descriptors / Indicators  Discomfort    Pain Type  Acute pain    Pain Onset  More than a month ago  Pain Frequency  Constant    Aggravating Factors   overhead movements    Pain Relieving Factors  at rest                       [facility] Adult PT Treatment/Exercise - 04/12/19 0001      Shoulder Exercises: Seated   Other Seated Exercises  seated scaption to 90 degrees x fatigue      Shoulder Exercises: Prone   Retraction  Strengthening;Right;20 reps;10 reps    Retraction Weight (lbs)  1    Flexion  Strengthening;Right;20 reps;Weights;10 reps    Flexion Weight (lbs)  1    Extension  Strengthening;Right;20 reps;Weights;10 reps    Extension Weight (lbs)  1      Shoulder Exercises: Sidelying   External Rotation  Strengthening;Right;Other (comment)   manual ER movement with eccentric lowering 2x fatigue     Shoulder Exercises: Standing   Diagonals   Strengthening;Right   2x10 on wall with 2# ball half range    Other Standing Exercises  standing wall slide with lift and lowering for eccentric control x10      Shoulder Exercises: Pulleys   Flexion  3 minutes      Shoulder Exercises: ROM/Strengthening   UBE (Upper Arm Bike)  60 RPM's x8 minutes.      Moist Heat Therapy   Number Minutes Moist Heat  10 Minutes    Moist Heat Location  Shoulder      Electrical Stimulation   Electrical Stimulation Location  R shoulder    Electrical Stimulation Action  premod    Electrical Stimulation Parameters  80-150hz  x30min    Electrical Stimulation Goals  Pain             PT Education - 04/12/19 1207    Education Details  ER sidelying HEP    Person(s) Educated  Patient    Methods  Explanation;Handout    Comprehension  Verbalized understanding;Returned demonstration          PT Long Term Goals - 04/12/19 1130      PT LONG TERM GOAL #1   Title  Patient will be independent with HEP    Time  6    Period  Weeks    Status  Achieved      PT LONG TERM GOAL #2   Title  Patient will report ability to perform ADLs with right shoulder pain less than or equal to 4/10.    Time  6    Period  Weeks    Status  On-going   not back to all ADL's 04/12/19     PT LONG TERM GOAL #3   Title  Patient will demonstrate right 45+ degrees of right shoulder ER AROM to improve ability dressing and drying back.    Time  6    Period  Weeks    Status  On-going            Plan - 04/12/19 1217    Clinical Impression Statement  Patient tolerated treatment well today. Patient able to progress with exercises for right shoulder. Patient very weak with ER and today performed manual ER with eccentric lowering. Patient gven HEP for home progression which he will have his wife help with. Patient continues to have some pain with movements and overhead activity. patient current goals ongoing at this time.    Personal Factors and Comorbidities  Age;Time since  onset of injury/illness/exacerbation;Comorbidity 1    Comorbidities  HTN  Examination-Activity Limitations  Reach Overhead;Carry;Lift;Dressing    Stability/Clinical Decision Making  Stable/Uncomplicated    Rehab Potential  Fair    PT Frequency  2x / week    PT Duration  6 weeks    PT Treatment/Interventions  ADLs/Self Care Home Management;Electrical Stimulation;Iontophoresis 4mg /ml Dexamethasone;Moist Heat;Ultrasound;Patient/family education;Manual techniques;Passive range of motion;Therapeutic activities;Therapeutic exercise;Vasopneumatic Device;Cryotherapy    PT Next Visit Plan  FOTO; progress note. continue with plan of care, progress strengthening as tolerable/manual ER with eccentic contol,  Modalities PRN for pain relief.    Consulted and Agree with Plan of Care  Patient       Patient will benefit from skilled therapeutic intervention in order to improve the following deficits and impairments:  Pain, Postural dysfunction, Decreased strength, Decreased range of motion, Decreased activity tolerance, Impaired UE functional use  Visit Diagnosis: Stiffness of right shoulder, not elsewhere classified  Chronic right shoulder pain  Muscle weakness (generalized)     Problem List Patient Active Problem List   Diagnosis Date Noted  . Renal cell carcinoma of right kidney ([place]) 02/01/2017  . Postoperative anemia due to acute blood loss 02/01/2017  . Renal mass 01/30/2017  . 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 Primary osteoarthritis of left hip 12/25/2016  . Primary localized osteoarthritis of right knee 08/26/2016  . Primary osteoarthritis of right knee 08/25/2016  . Status post revision of total hip 04/24/2016  . ED (erectile dysfunction) of organic origin 07/01/2012  . Malignant neoplasm of prostate ([place]) 07/01/2012  . Microscopic hematuria 07/01/2012  . Cervical stenosis of spinal canal 01/10/2012    Kaleem Sartwell P, PTA 04/12/2019, 12:20 PM  [facility] [place], [place], [address] Phone: [phone]   Fax:  [phone]  Name: Jonathan Harvey MRN: [id number] Date of Birth: 1946-03-03

## 2019-04-15 ENCOUNTER — Ambulatory Visit: Payer: Medicare PPO | Admitting: Physical Therapy

## 2019-04-15 ENCOUNTER — Other Ambulatory Visit: Payer: Self-pay

## 2019-04-15 ENCOUNTER — Encounter: Payer: Self-pay | Admitting: Physical Therapy

## 2019-04-15 DIAGNOSIS — M25611 Stiffness of right shoulder, not elsewhere classified: Secondary | ICD-10-CM

## 2019-04-15 DIAGNOSIS — M25511 Pain in right shoulder: Secondary | ICD-10-CM | POA: Diagnosis not present

## 2019-04-15 DIAGNOSIS — M6281 Muscle weakness (generalized): Secondary | ICD-10-CM

## 2019-04-15 DIAGNOSIS — G8929 Other chronic pain: Secondary | ICD-10-CM

## 2019-04-15 NOTE — Therapy (Signed)
[facility] [place], [place], [address] Phone: [phone]   Fax:  [phone]  Physical Therapy Treatment  Patient Details  Name: Jonathan Harvey MRN: [id number] Date of Birth: 09-30-1945 Referring Provider (PT): Grier Mitts, [place]   Encounter Date: 04/15/2019  PT End of Session - 04/15/19 1219    Visit Number  15    Number of Visits  20    Date for PT Re-Evaluation  04/13/19    Authorization Type  FOTO; Progress note every 10th visit; KX modifier at 15th visit    PT Start Time  1120    PT Stop Time  1216    PT Time Calculation (min)  56 min    Activity Tolerance  Patient tolerated treatment well    Behavior During Therapy  [facility] for tasks assessed/performed       Past Medical History:  Diagnosis Date  . Arthritis    "all over" (12/25/2016)  . Chronic lower back pain    "severe" (12/25/2016)  . Hepatitis B 2008  . History of kidney stones   . Hypertension   . Hypertriglyceridemia   . Prostate cancer ([place]) 10/2006   s/p robotic prostatectomy   . Renal cell carcinoma, right ([place])   . Spinal stenosis     Past Surgical History:  Procedure Laterality Date  . CARPAL TUNNEL RELEASE Right 2010  . CYST EXCISION Left    elbow  . FEMUR SURGERY Left ~ 1962   "femur was growing crooked; broke it to straighten it out; body cast for ~ 4 months"  . FINGER SURGERY Right 1987   right thumb and index finger (pt denies this hx on 12/25/2016)  . FRACTURE SURGERY    . INGUINAL HERNIA REPAIR Left 2008  . JOINT REPLACEMENT    . MASTOIDECTOMY  1982  . ORIF GREATER TROCHANTERIC FRACTURE Left 12/25/2016    ACETABULAR REVISION/notes 12/25/2016  . PROSTATECTOMY  10/2006  . ROBOTIC ASSITED PARTIAL NEPHRECTOMY Right 01/30/2017   Procedure: ROBOTIC ASSITED PARTIAL NEPHRECTOMY;  Surgeon: Ardis Hughs, MD;  [facility];  Service: Urology;  Laterality: Right;  . TOTAL HIP ARTHROPLASTY Left 2002  . TOTAL HIP REVISION  Left 04/24/2016   Procedure: TOTAL HIP REVISION;  Surgeon: [name], MD;  Location: [place];  Service: Orthopedics;  Laterality: Left;  . TOTAL HIP REVISION Left 12/25/2016   Procedure: OPEN REDUCTION INTERNAL FIXATION GREATER TROCHANTERIC FRACTURE, ACETABULAR REVISION;  Surgeon: [name], MD;  Location: [place];  Service: Orthopedics;  Laterality: Left;  . TOTAL KNEE ARTHROPLASTY Right 08/26/2016   Procedure: RIGHT TOTAL KNEE ARTHROPLASTY;  Surgeon: [name], MD;  Location: [place];  Service: Orthopedics;  Laterality: Right;    There were no vitals filed for this visit.  Subjective Assessment - 04/15/19 1125    Subjective  COVID-19 screen performed prior to patient entering clinic. Patient arrived doing well and was not able to do HEP as given last treatment    Patient is accompained by:  Interpreter    Pertinent History  controlled HTN, chronic lower back pain    Limitations  Lifting;House hold activities    Diagnostic tests  MRI: RTC tendon tear per patient    Patient Stated Goals  train for strengthening    Currently in Pain?  Yes    Pain Score  4     Pain Location  Shoulder    Pain Orientation  Right    Pain Descriptors / Indicators  Discomfort  Pain Type  Acute pain    Pain Onset  More than a month ago    Pain Frequency  Constant    Aggravating Factors   overhead movements    Pain Relieving Factors  at rest         [facility] PT Assessment - 04/15/19 0001      AROM   AROM Assessment Site  Shoulder    Right/Left Shoulder  Right    Right Shoulder External Rotation  31 Degrees      PROM   PROM Assessment Site  Shoulder    Right/Left Shoulder  Right    Right Shoulder External Rotation  44 Degrees                   [facility] Adult PT Treatment/Exercise - 04/15/19 0001      Shoulder Exercises: Prone   Retraction  Strengthening;Right;20 reps;10 reps    Retraction Weight (lbs)  1    Flexion  Strengthening;Right;20 reps;Weights;10 reps    Flexion Weight (lbs)  1     Extension  Strengthening;Right;20 reps;Weights;10 reps    Extension Weight (lbs)  1      Shoulder Exercises: Sidelying   External Rotation  Strengthening;Right;Other (comment)   manual ER with eccentric lowering  3x fatigue     Shoulder Exercises: Standing   Other Standing Exercises  scaption 1# 3x10    Other Standing Exercises  standing wall slide with lift and lowering for eccentric control 2x10      Shoulder Exercises: ROM/Strengthening   UBE (Upper Arm Bike)  60 RPM's x8 minutes.      Shoulder Exercises: Isometric Strengthening   External Rotation  Other (comment)   standing ER using grey resistance ball 5sec hold x15     Moist Heat Therapy   Number Minutes Moist Heat  10 Minutes    Moist Heat Location  Shoulder      Electrical Stimulation   Electrical Stimulation Location  R shoulder    Electrical Stimulation Action  premod    Electrical Stimulation Parameters  80-150hz  x61min    Electrical Stimulation Goals  Pain      Manual Therapy   Manual Therapy  Passive ROM    Passive ROM  manual PROM for right ER with gentle holds to improve mobility                  PT Long Term Goals - 04/15/19 1134      PT LONG TERM GOAL #1   Title  Patient will be independent with HEP    Time  6    Status  Achieved      PT LONG TERM GOAL #2   Title  Patient will report ability to perform ADLs with right shoulder pain less than or equal to 4/10.    Time  6    Period  Weeks    Status  On-going   Patient back to "mostly" everything and with 4-5/10 pain 04/15/19     PT LONG TERM GOAL #3   Title  Patient will demonstrate right 45+ degrees of right shoulder ER AROM to improve ability dressing and drying back.    Time  6    Period  Weeks    Status  On-going   AROM 31 degrees 04/15/19           Plan - 04/15/19 1202    Clinical Impression Statement  Patient tolerated treatment well today. Patient educated on doing daily HEP  to improve functional independence. Today  focused on technique and consistant daily exercises to improve strength and ROM. Patient has decreased ER ROM in right shoulder and weakness with active movement. Patient current goals ongoing due to pain, ROM and strength deficts.    Personal Factors and Comorbidities  Age;Time since onset of injury/illness/exacerbation;Comorbidity 1    Comorbidities  HTN    Examination-Activity Limitations  Reach Overhead;Carry;Lift;Dressing    Stability/Clinical Decision Making  Stable/Uncomplicated    Rehab Potential  Fair    PT Frequency  2x / week    PT Duration  6 weeks    PT Treatment/Interventions  ADLs/Self Care Home Management;Electrical Stimulation;Iontophoresis 4mg /ml Dexamethasone;Moist Heat;Ultrasound;Patient/family education;Manual techniques;Passive range of motion;Therapeutic activities;Therapeutic exercise;Vasopneumatic Device;Cryotherapy    PT Next Visit Plan  FOTO next treatment/continue with POC for strengthening/ ROM/manual ER with eccentic contol,  Modalities PRN for pain relief.    Consulted and Agree with Plan of Care  Patient       Patient will benefit from skilled therapeutic intervention in order to improve the following deficits and impairments:  Pain, Postural dysfunction, Decreased strength, Decreased range of motion, Decreased activity tolerance, Impaired UE functional use  Visit Diagnosis: Stiffness of right shoulder, not elsewhere classified  Chronic right shoulder pain  Muscle weakness (generalized)     Problem List Patient Active Problem List   Diagnosis Date Noted  . Renal cell carcinoma of right kidney ([place]) 02/01/2017  . Postoperative anemia due to acute blood loss 02/01/2017  . Renal mass 01/30/2017  . 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 Primary osteoarthritis of left hip 12/25/2016  . Primary localized osteoarthritis of right knee 08/26/2016  . Primary osteoarthritis of right knee 08/25/2016  . Status post revision of total hip 04/24/2016   . ED (erectile dysfunction) of organic origin 07/01/2012  . Malignant neoplasm of prostate ([place]) 07/01/2012  . Microscopic hematuria 07/01/2012  . Cervical stenosis of spinal canal 01/10/2012    Arisbel Maione P, PTA 04/15/2019, 12:25 PM  [facility] [place], [place], [address] Phone: [phone]   Fax:  [phone]  Name: ARISTIDIS ADLER MRN: [id number] Date of Birth: 05-May-1945

## 2019-04-19 ENCOUNTER — Ambulatory Visit: Payer: Medicare PPO | Admitting: Physical Therapy

## 2019-04-19 ENCOUNTER — Encounter: Payer: Self-pay | Admitting: Physical Therapy

## 2019-04-19 ENCOUNTER — Other Ambulatory Visit: Payer: Self-pay

## 2019-04-19 DIAGNOSIS — M25611 Stiffness of right shoulder, not elsewhere classified: Secondary | ICD-10-CM

## 2019-04-19 DIAGNOSIS — M25511 Pain in right shoulder: Secondary | ICD-10-CM | POA: Diagnosis not present

## 2019-04-19 DIAGNOSIS — G8929 Other chronic pain: Secondary | ICD-10-CM

## 2019-04-19 DIAGNOSIS — M6281 Muscle weakness (generalized): Secondary | ICD-10-CM

## 2019-04-19 NOTE — Therapy (Signed)
[facility] [place], [place], [address] Phone: [phone]   Fax:  [phone]  Physical Therapy Treatment  Patient Details  Name: Jonathan Harvey MRN: [id number] Date of Birth: August 15, 1945 Referring Provider (PT): Grier Mitts, [place]   Encounter Date: 04/19/2019  PT End of Session - 04/19/19 1149    Visit Number  16    Number of Visits  20    Date for PT Re-Evaluation  05/07/19    Authorization Type  FOTO (16th visit 32%); Progress note every 10th visit; KX modifier at 15th visit    PT Start Time  1121    PT Stop Time  1209    PT Time Calculation (min)  48 min    Activity Tolerance  Patient tolerated treatment well    Behavior During Therapy  [facility] for tasks assessed/performed       Past Medical History:  Diagnosis Date  . Arthritis    "all over" (12/25/2016)  . Chronic lower back pain    "severe" (12/25/2016)  . Hepatitis B 2008  . History of kidney stones   . Hypertension   . Hypertriglyceridemia   . Prostate cancer ([place]) 10/2006   s/p robotic prostatectomy   . Renal cell carcinoma, right ([place])   . Spinal stenosis     Past Surgical History:  Procedure Laterality Date  . CARPAL TUNNEL RELEASE Right 2010  . CYST EXCISION Left    elbow  . FEMUR SURGERY Left ~ 1962   "femur was growing crooked; broke it to straighten it out; body cast for ~ 4 months"  . FINGER SURGERY Right 1987   right thumb and index finger (pt denies this hx on 12/25/2016)  . FRACTURE SURGERY    . INGUINAL HERNIA REPAIR Left 2008  . JOINT REPLACEMENT    . MASTOIDECTOMY  1982  . ORIF GREATER TROCHANTERIC FRACTURE Left 12/25/2016    ACETABULAR REVISION/notes 12/25/2016  . PROSTATECTOMY  10/2006  . ROBOTIC ASSITED PARTIAL NEPHRECTOMY Right 01/30/2017   Procedure: ROBOTIC ASSITED PARTIAL NEPHRECTOMY;  Surgeon: Ardis Hughs, MD;  [facility];  Service: Urology;  Laterality: Right;  . TOTAL HIP ARTHROPLASTY Left 2002  . TOTAL  HIP REVISION Left 04/24/2016   Procedure: TOTAL HIP REVISION;  Surgeon: [name], MD;  Location: [place];  Service: Orthopedics;  Laterality: Left;  . TOTAL HIP REVISION Left 12/25/2016   Procedure: OPEN REDUCTION INTERNAL FIXATION GREATER TROCHANTERIC FRACTURE, ACETABULAR REVISION;  Surgeon: [name], MD;  Location: [place];  Service: Orthopedics;  Laterality: Left;  . TOTAL KNEE ARTHROPLASTY Right 08/26/2016   Procedure: RIGHT TOTAL KNEE ARTHROPLASTY;  Surgeon: [name], MD;  Location: [place];  Service: Orthopedics;  Laterality: Right;    There were no vitals filed for this visit.  Subjective Assessment - 04/19/19 1127    Subjective  COVID-19 screen performed prior to patient entering clinic. Patient arrived with some soreness today yet overall well    Pertinent History  controlled HTN, chronic lower back pain    Limitations  Lifting;House hold activities    Diagnostic tests  MRI: RTC tendon tear per patient    Patient Stated Goals  train for strengthening    Currently in Pain?  Yes    Pain Score  4     Pain Location  Shoulder    Pain Orientation  Right    Pain Descriptors / Indicators  Discomfort    Pain Type  Acute pain    Pain Onset  More than a month ago    Pain Frequency  Constant    Aggravating Factors   overhead movements    Pain Relieving Factors  rest         [facility] PT Assessment - 04/19/19 0001      AROM   AROM Assessment Site  Shoulder    Right/Left Shoulder  Right    Right Shoulder External Rotation  30 Degrees      PROM   PROM Assessment Site  Shoulder    Right/Left Shoulder  Right    Right Shoulder External Rotation  55 Degrees                   [facility] Adult PT Treatment/Exercise - 04/19/19 0001      Shoulder Exercises: Sidelying   External Rotation  Strengthening;Right;Other (comment)   manual assist with eccentric lowering     Shoulder Exercises: Standing   Other Standing Exercises  scaption 1# 3x10    Other Standing Exercises   standing wall slide with lift and lowering for eccentric control 2x10      Shoulder Exercises: ROM/Strengthening   UBE (Upper Arm Bike)  60 RPM's x8 minutes.    Other ROM/Strengthening Exercises  seated ER for "hitch hiker" and @ 90/90 ER      Moist Heat Therapy   Number Minutes Moist Heat  10 Minutes    Moist Heat Location  Shoulder      Electrical Stimulation   Electrical Stimulation Location  R shoulder    Electrical Stimulation Action  premod    Electrical Stimulation Parameters  80-150hz  x49min    Electrical Stimulation Goals  Pain      Manual Therapy   Manual Therapy  Passive ROM    Passive ROM  manual PROM for right ER with gentle holds to improve mobility, then rythmis stabs for IR and esp ER with holds in scaption                  PT Long Term Goals - 04/15/19 1134      PT LONG TERM GOAL #1   Title  Patient will be independent with HEP    Time  6    Status  Achieved      PT LONG TERM GOAL #2   Title  Patient will report ability to perform ADLs with right shoulder pain less than or equal to 4/10.    Time  6    Period  Weeks    Status  On-going   Patient back to "mostly" everything and with 4-5/10 pain 04/15/19     PT LONG TERM GOAL #3   Title  Patient will demonstrate right 45+ degrees of right shoulder ER AROM to improve ability dressing and drying back.    Time  6    Period  Weeks    Status  On-going   AROM 31 degrees 04/15/19           Plan - 04/19/19 1212    Clinical Impression Statement  Patient tolerated treatment well today. Patient has improved with passive ROM for ER yet limited activly at this time. Today focused on ER strength in all positions to help improve function.Patient very limited with antigravity ER movement today. Patient current goals ongoing.    Personal Factors and Comorbidities  Age;Time since onset of injury/illness/exacerbation;Comorbidity 1    Comorbidities  HTN    Examination-Activity Limitations  Reach  Overhead;Carry;Lift;Dressing    Stability/Clinical Decision Making  Stable/Uncomplicated  Rehab Potential  Fair    PT Frequency  2x / week    PT Duration  6 weeks    PT Treatment/Interventions  ADLs/Self Care Home Management;Electrical Stimulation;Iontophoresis 4mg /ml Dexamethasone;Moist Heat;Ultrasound;Patient/family education;Manual techniques;Passive range of motion;Therapeutic activities;Therapeutic exercise;Vasopneumatic Device;Cryotherapy    PT Next Visit Plan  continue with POC for strengthening/ ROM/manual ER with eccentic contol,  Modalities PRN for pain relief.    Consulted and Agree with Plan of Care  Patient       Patient will benefit from skilled therapeutic intervention in order to improve the following deficits and impairments:  Pain, Postural dysfunction, Decreased strength, Decreased range of motion, Decreased activity tolerance, Impaired UE functional use  Visit Diagnosis: Stiffness of right shoulder, not elsewhere classified  Chronic right shoulder pain  Muscle weakness (generalized)     Problem List Patient Active Problem List   Diagnosis Date Noted  . Renal cell carcinoma of right kidney ([place]) 02/01/2017  . Postoperative anemia due to acute blood loss 02/01/2017  . Renal mass 01/30/2017  . 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 Primary osteoarthritis of left hip 12/25/2016  . Primary localized osteoarthritis of right knee 08/26/2016  . Primary osteoarthritis of right knee 08/25/2016  . Status post revision of total hip 04/24/2016  . ED (erectile dysfunction) of organic origin 07/01/2012  . Malignant neoplasm of prostate ([place]) 07/01/2012  . Microscopic hematuria 07/01/2012  . Cervical stenosis of spinal canal 01/10/2012    Jolee Critcher P, PTA 04/19/2019, 12:19 PM  [facility] [place], [place], [address] Phone: [phone]   Fax:  [phone]  Name: BALJINDER KRECH MRN:  [id number] Date of Birth: Feb 28, 1946

## 2019-04-22 ENCOUNTER — Encounter: Payer: Medicare PPO | Admitting: Physical Therapy

## 2019-04-26 ENCOUNTER — Ambulatory Visit: Payer: Medicare PPO | Admitting: *Deleted

## 2019-04-26 ENCOUNTER — Other Ambulatory Visit: Payer: Self-pay

## 2019-04-26 DIAGNOSIS — M25511 Pain in right shoulder: Secondary | ICD-10-CM

## 2019-04-26 DIAGNOSIS — M25611 Stiffness of right shoulder, not elsewhere classified: Secondary | ICD-10-CM

## 2019-04-26 DIAGNOSIS — M6281 Muscle weakness (generalized): Secondary | ICD-10-CM

## 2019-04-26 DIAGNOSIS — G8929 Other chronic pain: Secondary | ICD-10-CM

## 2019-04-26 NOTE — Therapy (Signed)
[facility] [place], [place], [address] Phone: [phone]   Fax:  [phone]  Physical Therapy Treatment  Patient Details  Name: Jonathan Harvey MRN: [id number] Date of Birth: November 20, 1945 Referring Provider (PT): Grier Mitts, [place]   Encounter Date: 04/26/2019  PT End of Session - 04/26/19 1133    Visit Number  17    Number of Visits  20    Date for PT Re-Evaluation  05/07/19    Authorization Type  FOTO (16th visit 32%); Progress note every 10th visit; KX modifier at 15th visit    PT Start Time  1122    PT Stop Time  1210    PT Time Calculation (min)  48 min       Past Medical History:  Diagnosis Date  . Arthritis    "all over" (12/25/2016)  . Chronic lower back pain    "severe" (12/25/2016)  . Hepatitis B 2008  . History of kidney stones   . Hypertension   . Hypertriglyceridemia   . Prostate cancer ([place]) 10/2006   s/p robotic prostatectomy   . Renal cell carcinoma, right ([place])   . Spinal stenosis     Past Surgical History:  Procedure Laterality Date  . CARPAL TUNNEL RELEASE Right 2010  . CYST EXCISION Left    elbow  . FEMUR SURGERY Left ~ 1962   "femur was growing crooked; broke it to straighten it out; body cast for ~ 4 months"  . FINGER SURGERY Right 1987   right thumb and index finger (pt denies this hx on 12/25/2016)  . FRACTURE SURGERY    . INGUINAL HERNIA REPAIR Left 2008  . JOINT REPLACEMENT    . MASTOIDECTOMY  1982  . ORIF GREATER TROCHANTERIC FRACTURE Left 12/25/2016    ACETABULAR REVISION/notes 12/25/2016  . PROSTATECTOMY  10/2006  . ROBOTIC ASSITED PARTIAL NEPHRECTOMY Right 01/30/2017   Procedure: ROBOTIC ASSITED PARTIAL NEPHRECTOMY;  Surgeon: Ardis Hughs, MD;  Location: [facility];  Service: Urology;  Laterality: Right;  . TOTAL HIP ARTHROPLASTY Left 2002  . TOTAL HIP REVISION Left 04/24/2016   Procedure: TOTAL HIP REVISION;  Surgeon: [name], MD;  Location: [place];  Service:  Orthopedics;  Laterality: Left;  . TOTAL HIP REVISION Left 12/25/2016   Procedure: OPEN REDUCTION INTERNAL FIXATION GREATER TROCHANTERIC FRACTURE, ACETABULAR REVISION;  Surgeon: [name], MD;  Location: [place];  Service: Orthopedics;  Laterality: Left;  . TOTAL KNEE ARTHROPLASTY Right 08/26/2016   Procedure: RIGHT TOTAL KNEE ARTHROPLASTY;  Surgeon: [name], MD;  Location: [place] [facility];  Service: Orthopedics;  Laterality: Right;    There were no vitals filed for this visit.  Subjective Assessment - 04/26/19 1125    Subjective  COVID-19 screen performed prior to patient entering clinic. Patient arrived with some soreness today yet overall well. I was able to shave my entire face with my RT arm. Usually I have to switch hands    Patient is accompained by:  Interpreter    Pertinent History  controlled HTN, chronic lower back pain    Limitations  Lifting;House hold activities    Diagnostic tests  MRI: RTC tendon tear per patient    Patient Stated Goals  train for strengthening    Currently in Pain?  Yes    Pain Score  6     Pain Location  Shoulder    Pain Orientation  Right    Pain Descriptors / Indicators  Discomfort    Pain Type  Acute  pain    Pain Onset  More than a month ago                       [facility] Adult PT Treatment/Exercise - 04/26/19 0001      Shoulder Exercises: Prone   Retraction  Strengthening;Right;20 reps;10 reps    Retraction Weight (lbs)  3      Shoulder Exercises: Sidelying   External Rotation  --      Shoulder Exercises: Standing   External Rotation  Right;10 reps   isometrics at wall   Other Standing Exercises  scaption 1# 3x10, flexion 1# 3x10      Shoulder Exercises: Pulleys   Flexion  3 minutes      Shoulder Exercises: ROM/Strengthening   UBE (Upper Arm Bike)  60 RPM's x8 minutes.    Other ROM/Strengthening Exercises  seated ER for "hitch hiker" and @ 90/90 ER      Moist Heat Therapy   Number Minutes Moist Heat  10 Minutes    Moist  Heat Location  Shoulder      Electrical Stimulation   Electrical Stimulation Location  R shoulder    Electrical Stimulation Action  premod    Electrical Stimulation Parameters  80-150hz  x 15 mins    Electrical Stimulation Goals  Pain      Manual Therapy   Manual Therapy  Passive ROM    Passive ROM  manual PROM for right ER with gentle holds to improve mobility, then rythmis stabs for  ER                   PT Long Term Goals - 04/15/19 1134      PT LONG TERM GOAL #1   Title  Patient will be independent with HEP    Time  6    Status  Achieved      PT LONG TERM GOAL #2   Title  Patient will report ability to perform ADLs with right shoulder pain less than or equal to 4/10.    Time  6    Period  Weeks    Status  On-going   Patient back to "mostly" everything and with 4-5/10 pain 04/15/19     PT LONG TERM GOAL #3   Title  Patient will demonstrate right 45+ degrees of right shoulder ER AROM to improve ability dressing and drying back.    Time  6    Period  Weeks    Status  On-going   AROM 31 degrees 04/15/19           Plan - 04/26/19 1220    Clinical Impression Statement  Pt arrived today doing better with RT shldr and reports he was able to shave today entirely with RT hand and didn't have to switch due to pain/ fatigue. Rx focused on strength for RT UE. Normal modality response.    Personal Factors and Comorbidities  Age;Time since onset of injury/illness/exacerbation;Comorbidity 1    Comorbidities  HTN    Examination-Activity Limitations  Reach Overhead;Carry;Lift;Dressing    Stability/Clinical Decision Making  Stable/Uncomplicated    Rehab Potential  Fair    PT Frequency  2x / week    PT Duration  6 weeks    PT Treatment/Interventions  ADLs/Self Care Home Management;Electrical Stimulation;Iontophoresis 4mg /ml Dexamethasone;Moist Heat;Ultrasound;Patient/family education;Manual techniques;Passive range of motion;Therapeutic activities;Therapeutic  exercise;Vasopneumatic Device;Cryotherapy    PT Next Visit Plan  continue with POC for strengthening/ ROM/manual ER with eccentic contol,  Modalities  PRN for pain relief.    PT Home Exercise Plan  4 way isometrics    Consulted and Agree with Plan of Care  Patient       Patient will benefit from skilled therapeutic intervention in order to improve the following deficits and impairments:  Pain, Postural dysfunction, Decreased strength, Decreased range of motion, Decreased activity tolerance, Impaired UE functional use  Visit Diagnosis: Stiffness of right shoulder, not elsewhere classified  Chronic right shoulder pain  Muscle weakness (generalized)     Problem List Patient Active Problem List   Diagnosis Date Noted  . Renal cell carcinoma of right kidney ([place]) 02/01/2017  . Postoperative anemia due to acute blood loss 02/01/2017  . Renal mass 01/30/2017  . 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 Primary osteoarthritis of left hip 12/25/2016  . Primary localized osteoarthritis of right knee 08/26/2016  . Primary osteoarthritis of right knee 08/25/2016  . Status post revision of total hip 04/24/2016  . ED (erectile dysfunction) of organic origin 07/01/2012  . Malignant neoplasm of prostate ([place]) 07/01/2012  . Microscopic hematuria 07/01/2012  . Cervical stenosis of spinal canal 01/10/2012    Kanyla Omeara,[name] , PTA 04/26/2019, 12:34 PM  [facility] [place], [place], [address] Phone: [phone]   Fax:  [phone]  Name: Jonathan Harvey MRN: [id number] Date of Birth: [birth year]

## 2019-04-29 ENCOUNTER — Encounter: Payer: Medicare PPO | Admitting: Physical Therapy

## 2019-05-03 ENCOUNTER — Other Ambulatory Visit: Payer: Self-pay

## 2019-05-03 ENCOUNTER — Ambulatory Visit: Payer: Medicare PPO | Attending: Orthopedic Surgery | Admitting: Physical Therapy

## 2019-05-03 ENCOUNTER — Encounter: Payer: Self-pay | Admitting: Physical Therapy

## 2019-05-03 DIAGNOSIS — M6281 Muscle weakness (generalized): Secondary | ICD-10-CM | POA: Diagnosis present

## 2019-05-03 DIAGNOSIS — M25611 Stiffness of right shoulder, not elsewhere classified: Secondary | ICD-10-CM

## 2019-05-03 DIAGNOSIS — M25511 Pain in right shoulder: Secondary | ICD-10-CM | POA: Insufficient documentation

## 2019-05-03 DIAGNOSIS — G8929 Other chronic pain: Secondary | ICD-10-CM

## 2019-05-03 NOTE — Therapy (Signed)
[facility] [place], [place], [address] Phone: [phone]   Fax:  [phone]  Physical Therapy Treatment  Patient Details  Name: Jonathan Harvey MRN: [id number] Date of Birth: 06-18-1945 Referring Provider (PT): Grier Mitts, [place]   Encounter Date: 05/03/2019  PT End of Session - 05/03/19 1137    Visit Number  18    Number of Visits  20    Date for PT Re-Evaluation  05/07/19    Authorization Type  FOTO (16th visit 32%); Progress note every 10th visit; KX modifier at 15th visit    PT Start Time  1116    PT Stop Time  1158    PT Time Calculation (min)  42 min    Activity Tolerance  Patient tolerated treatment well    Behavior During Therapy  WFL for tasks assessed/performed       Past Medical History:  Diagnosis Date  . Arthritis    "all over" (12/25/2016)  . Chronic lower back pain    "severe" (12/25/2016)  . Hepatitis B 2008  . History of kidney stones   . Hypertension   . Hypertriglyceridemia   . Prostate cancer ([place]) 10/2006   s/p robotic prostatectomy   . Renal cell carcinoma, right ([place])   . Spinal stenosis     Past Surgical History:  Procedure Laterality Date  . CARPAL TUNNEL RELEASE Right 2010  . CYST EXCISION Left    elbow  . FEMUR SURGERY Left ~ 1962   "femur was growing crooked; broke it to straighten it out; body cast for ~ 4 months"  . FINGER SURGERY Right 1987   right thumb and index finger (pt denies this hx on 12/25/2016)  . FRACTURE SURGERY    . INGUINAL HERNIA REPAIR Left 2008  . JOINT REPLACEMENT    . MASTOIDECTOMY  1982  . ORIF GREATER TROCHANTERIC FRACTURE Left 12/25/2016    ACETABULAR REVISION/notes 12/25/2016  . PROSTATECTOMY  10/2006  . ROBOTIC ASSITED PARTIAL NEPHRECTOMY Right 01/30/2017   Procedure: ROBOTIC ASSITED PARTIAL NEPHRECTOMY;  Surgeon: Ardis Hughs, MD;  [facility];  Service: Urology;  Laterality: Right;  . TOTAL HIP ARTHROPLASTY Left 2002  . TOTAL  HIP REVISION Left 04/24/2016   Procedure: TOTAL HIP REVISION;  Surgeon: [name], MD;  Location: [facility];  Service: Orthopedics;  Laterality: Left;  . TOTAL HIP REVISION Left 12/25/2016   Procedure: OPEN REDUCTION INTERNAL FIXATION GREATER TROCHANTERIC FRACTURE, ACETABULAR REVISION;  Surgeon: [name], MD;  Location: [place];  Service: Orthopedics;  Laterality: Left;  . TOTAL KNEE ARTHROPLASTY Right 08/26/2016   Procedure: RIGHT TOTAL KNEE ARTHROPLASTY;  Surgeon: [name], MD;  Location: [facility];  Service: Orthopedics;  Laterality: Right;    There were no vitals filed for this visit.  Subjective Assessment - 05/03/19 1117    Subjective  COVID-19 screen performed prior to patient entering clinic. Patient arrived with some ongoing soreness. with overall improvement    Pertinent History  controlled HTN, chronic lower back pain    Limitations  Lifting;House hold activities    Diagnostic tests  MRI: RTC tendon tear per patient    Patient Stated Goals  train for strengthening    Currently in Pain?  Yes    Pain Score  4     Pain Location  Shoulder    Pain Orientation  Right    Pain Descriptors / Indicators  Discomfort    Pain Type  Acute pain    Pain Onset  More than a month ago    Pain Frequency  Constant    Aggravating Factors   overhead movement    Pain Relieving Factors  at rest                       [facility] Adult PT Treatment/Exercise - 05/03/19 0001      Shoulder Exercises: Prone   Retraction  Strengthening;Right;20 reps;10 reps    Retraction Weight (lbs)  3    Extension  Strengthening;Right;20 reps;Weights;10 reps    Extension Weight (lbs)  3      Shoulder Exercises: Standing   External Rotation  Right;10 reps   isometrics with ball on wall for resistance   Other Standing Exercises  standing wall circles 2x fatigue      Shoulder Exercises: ROM/Strengthening   UBE (Upper Arm Bike)  60 RPM's x8 minutes.    Other ROM/Strengthening Exercises  seated ER for  "hitch hiker" and @ 90/90 ER      Moist Heat Therapy   Number Minutes Moist Heat  10 Minutes    Moist Heat Location  Shoulder      Electrical Stimulation   Electrical Stimulation Location  R shoulder    Electrical Stimulation Action  premod    Electrical Stimulation Parameters  80-150hz  x65min    Electrical Stimulation Goals  Pain                  PT Long Term Goals - 05/03/19 1130      PT LONG TERM GOAL #1   Title  Patient will be independent with HEP    Period  Weeks    Status  Achieved      PT LONG TERM GOAL #2   Title  Patient will report ability to perform ADLs with right shoulder pain less than or equal to 4/10.    Time  6    Period  Weeks    Status  On-going   pain up to 6/10 with all ADL's 05/03/19     PT LONG TERM GOAL #3   Title  Patient will demonstrate right 45+ degrees of right shoulder ER AROM to improve ability dressing and drying back.    Time  6    Period  Weeks    Status  On-going            Plan - 05/03/19 1139    Clinical Impression Statement  Patient tolerated treatment well today. patient able to progress with exercises today with no increased discomfort. Patient required verbal education for ER exercises to avoid other muscles compensation. Patient is able to perform all ADL's yet up to 6/10 at most per reported. Current goals ongoing today.    Personal Factors and Comorbidities  Age;Time since onset of injury/illness/exacerbation;Comorbidity 1    Examination-Activity Limitations  Reach Overhead;Carry;Lift;Dressing    Stability/Clinical Decision Making  Stable/Uncomplicated    Rehab Potential  Fair    PT Frequency  2x / week    PT Duration  6 weeks    PT Treatment/Interventions  ADLs/Self Care Home Management;Electrical Stimulation;Iontophoresis 4mg /ml Dexamethasone;Moist Heat;Ultrasound;Patient/family education;Manual techniques;Passive range of motion;Therapeutic activities;Therapeutic exercise;Vasopneumatic Device;Cryotherapy    PT  Next Visit Plan  continue with POC for strengthening/ ROM  Modalities PRN for pain relief.    Consulted and Agree with Plan of Care  Patient       Patient will benefit from skilled therapeutic intervention in order to improve the following deficits and impairments:  Pain,  Postural dysfunction, Decreased strength, Decreased range of motion, Decreased activity tolerance, Impaired UE functional use  Visit Diagnosis: Stiffness of right shoulder, not elsewhere classified  Chronic right shoulder pain  Muscle weakness (generalized)     Problem List Patient Active Problem List   Diagnosis Date Noted  . Renal cell carcinoma of right kidney ([place]) 02/01/2017  . Postoperative anemia due to acute blood loss 02/01/2017  . Renal mass 01/30/2017  . 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 Primary osteoarthritis of left hip 12/25/2016  . Primary localized osteoarthritis of right knee 08/26/2016  . Primary osteoarthritis of right knee 08/25/2016  . Status post revision of total hip 04/24/2016  . ED (erectile dysfunction) of organic origin 07/01/2012  . Malignant neoplasm of prostate ([place]) 07/01/2012  . Microscopic hematuria 07/01/2012  . Cervical stenosis of spinal canal 01/10/2012    Jonathan Harvey P, PTA 05/03/2019, 12:01 PM  [facility] [place], [place], [address] Phone: [phone]   Fax:  [phone]  Name: Jonathan Harvey MRN: [id number] Date of Birth: 1945-11-13

## 2019-05-06 ENCOUNTER — Encounter: Payer: Self-pay | Admitting: Physical Therapy

## 2019-05-06 ENCOUNTER — Ambulatory Visit: Payer: Medicare PPO | Admitting: Physical Therapy

## 2019-05-06 ENCOUNTER — Other Ambulatory Visit: Payer: Self-pay

## 2019-05-06 DIAGNOSIS — M6281 Muscle weakness (generalized): Secondary | ICD-10-CM

## 2019-05-06 DIAGNOSIS — G8929 Other chronic pain: Secondary | ICD-10-CM

## 2019-05-06 DIAGNOSIS — M25611 Stiffness of right shoulder, not elsewhere classified: Secondary | ICD-10-CM

## 2019-05-06 NOTE — Therapy (Signed)
[facility] [place], [place], [address] Phone: [phone]   Fax:  [phone]  Physical Therapy Treatment  Patient Details  Name: Jonathan Harvey MRN: [id number] Date of Birth: Feb 06, 1946 Referring Provider (PT): Grier Mitts, [place]   Encounter Date: 05/06/2019  PT End of Session - 05/06/19 1203    Visit Number  19    Number of Visits  20    Date for PT Re-Evaluation  05/07/19    Authorization Type  FOTO (16th visit 32%); Progress note every 10th visit; KX modifier at 15th visit    PT Start Time  1116    PT Stop Time  1206    PT Time Calculation (min)  50 min    Activity Tolerance  Patient tolerated treatment well    Behavior During Therapy  [facility] for tasks assessed/performed       Past Medical History:  Diagnosis Date  . Arthritis    "all over" (12/25/2016)  . Chronic lower back pain    "severe" (12/25/2016)  . Hepatitis B 2008  . History of kidney stones   . Hypertension   . Hypertriglyceridemia   . Prostate cancer ([place]) 10/2006   s/p robotic prostatectomy   . Renal cell carcinoma, right ([place])   . Spinal stenosis     Past Surgical History:  Procedure Laterality Date  . CARPAL TUNNEL RELEASE Right 2010  . CYST EXCISION Left    elbow  . FEMUR SURGERY Left ~ 1962   "femur was growing crooked; broke it to straighten it out; body cast for ~ 4 months"  . FINGER SURGERY Right 1987   right thumb and index finger (pt denies this hx on 12/25/2016)  . FRACTURE SURGERY    . INGUINAL HERNIA REPAIR Left 2008  . JOINT REPLACEMENT    . MASTOIDECTOMY  1982  . ORIF GREATER TROCHANTERIC FRACTURE Left 12/25/2016    ACETABULAR REVISION/notes 12/25/2016  . PROSTATECTOMY  10/2006  . ROBOTIC ASSITED PARTIAL NEPHRECTOMY Right 01/30/2017   Procedure: ROBOTIC ASSITED PARTIAL NEPHRECTOMY;  Surgeon: Ardis Hughs, MD;  [facility];  Service: Urology;  Laterality: Right;  . TOTAL HIP ARTHROPLASTY Left 2002  . TOTAL  HIP REVISION Left 04/24/2016   Procedure: TOTAL HIP REVISION;  Surgeon: [name], MD;  Location: [place];  Service: Orthopedics;  Laterality: Left;  . TOTAL HIP REVISION Left 12/25/2016   Procedure: OPEN REDUCTION INTERNAL FIXATION GREATER TROCHANTERIC FRACTURE, ACETABULAR REVISION;  Surgeon: [name], MD;  Location: [facility];  Service: Orthopedics;  Laterality: Left;  . TOTAL KNEE ARTHROPLASTY Right 08/26/2016   Procedure: RIGHT TOTAL KNEE ARTHROPLASTY;  Surgeon: [name], MD;  Location: [facility];  Service: Orthopedics;  Laterality: Right;    There were no vitals filed for this visit.  Subjective Assessment - 05/06/19 1143    Subjective  COVID-19 screen performed prior to patient entering clinic. Patient reported feeling pleased with progress and is ready for discharge today.    Pertinent History  controlled HTN, chronic lower back pain    Limitations  Lifting;House hold activities    Diagnostic tests  MRI: RTC tendon tear per patient    Patient Stated Goals  train for strengthening    Currently in Pain?  Yes    Pain Score  4     Pain Location  Shoulder    Pain Orientation  Right    Pain Descriptors / Indicators  Discomfort    Pain Type  Acute pain  Pain Onset  More than a month ago    Pain Frequency  Constant         [facility] PT Assessment - 05/06/19 0001      Assessment   Medical Diagnosis  Right shoulder irreparable RTC    Referring Provider (PT)  Grier Mitts, PA-C    Next MD Visit  03/31/2019    Prior Therapy  no      Observation/Other Assessments   Focus on Therapeutic Outcomes (FOTO)   37% limitation      AROM   AROM Assessment Site  Shoulder    Right/Left Shoulder  Right    Right Shoulder External Rotation  35 Degrees                   [facility] Adult PT Treatment/Exercise - 05/06/19 0001      Shoulder Exercises: Prone   Retraction  Strengthening;Right;20 reps;10 reps    Retraction Weight (lbs)  3    Extension  Strengthening;Right;20  reps;Weights;10 reps    Extension Weight (lbs)  3      Shoulder Exercises: Standing   External Rotation  Right;10 reps    Other Standing Exercises  scaption 1# 3x10, flexion 1# 3x10      Shoulder Exercises: Pulleys   Flexion  5 minutes      Shoulder Exercises: ROM/Strengthening   UBE (Upper Arm Bike)  60 RPM's x8 minutes.    Ranger  Standing UE Ranger x 5 minutes into flexion, circles (CW and CCW) and ER.      Moist Heat Therapy   Number Minutes Moist Heat  10 Minutes    Moist Heat Location  Shoulder      Electrical Stimulation   Electrical Stimulation Location  R shoulder    Electrical Stimulation Action  pre-mod    Electrical Stimulation Parameters  80-150 hz x10 mins    Electrical Stimulation Goals  Pain                  PT Long Term Goals - 05/06/19 1142      PT LONG TERM GOAL #1   Title  Patient will be independent with HEP    Period  Weeks    Status  Achieved      PT LONG TERM GOAL #2   Title  Patient will report ability to perform ADLs with right shoulder pain less than or equal to 4/10.    Time  6    Period  Weeks    Status  Achieved      PT LONG TERM GOAL #3   Title  Patient will demonstrate right 45+ degrees of right shoulder ER AROM to improve ability dressing and drying back.    Time  6    Period  Weeks    Status  Not Met            Plan - 05/06/19 1147    Clinical Impression Statement  Patient responded well to therapy session with no reports of increased pain, just fatigue. Patient was able to demonstrate good form with all TEs. Patient is confident with HEP and understands the importance of performing to maintain gains. Patient to be discharged today with goals partially met.    Personal Factors and Comorbidities  Age;Time since onset of injury/illness/exacerbation;Comorbidity 1    Comorbidities  HTN    Examination-Activity Limitations  Reach Overhead;Carry;Lift;Dressing    Clinical Decision Making  Low    Rehab Potential  Fair  PT Frequency  2x / week    PT Duration  6 weeks    PT Treatment/Interventions  ADLs/Self Care Home Management;Electrical Stimulation;Iontophoresis 72m/ml Dexamethasone;Moist Heat;Ultrasound;Patient/family education;Manual techniques;Passive range of motion;Therapeutic activities;Therapeutic exercise;Vasopneumatic Device;Cryotherapy    PT Next Visit Plan  Discharge    PT Home Exercise Plan  4 way isometrics    Consulted and Agree with Plan of Care  Patient       Patient will benefit from skilled therapeutic intervention in order to improve the following deficits and impairments:  Pain, Postural dysfunction, Decreased strength, Decreased range of motion, Decreased activity tolerance, Impaired UE functional use  Visit Diagnosis: Stiffness of right shoulder, not elsewhere classified  Chronic right shoulder pain  Muscle weakness (generalized)     Problem List Patient Active Problem List   Diagnosis Date Noted  . Renal cell carcinoma of right kidney ([place] 02/01/2017  . Postoperative anemia due to acute blood loss 02/01/2017  . Renal mass 01/30/2017  . Failure of [place] hip [place]plasty ([place] 12/25/2016  . Primary osteoarthritis of left hip 12/25/2016  . Primary localized osteoarthritis of right knee 08/26/2016  . Primary osteoarthritis of right knee 08/25/2016  . Status post revision of total hip 04/24/2016  . ED (erectile dysfunction) of organic origin 07/01/2012  . Malignant neoplasm of prostate ([place] 07/01/2012  . Microscopic hematuria 07/01/2012  . Cervical stenosis of spinal canal 01/10/2012    KGabriela Eves PT, DPT 05/06/2019, 12:13 PM  [facility] [address] [place] [address]Phone: [phone]  Fax:  [phone] Name: JJOHATHON OVERTURFMRN: [id number] of Birth: [birth year]

## 2019-05-10 ENCOUNTER — Ambulatory Visit: Payer: Medicare PPO | Admitting: Physical Therapy

## 2019-06-30 ENCOUNTER — Other Ambulatory Visit: Payer: Self-pay | Admitting: Family Medicine

## 2019-06-30 DIAGNOSIS — I779 Disorder of arteries and arterioles, unspecified: Secondary | ICD-10-CM

## 2019-07-02 ENCOUNTER — Other Ambulatory Visit: Payer: Self-pay | Admitting: Family Medicine

## 2019-07-02 ENCOUNTER — Ambulatory Visit
Admission: RE | Admit: 2019-07-02 | Discharge: 2019-07-02 | Disposition: A | Discharge status: Home / Self Care | Payer: Medicare PPO | Source: Ambulatory Visit | Attending: Family Medicine | Admitting: Family Medicine

## 2019-07-02 DIAGNOSIS — M545 Low back pain, unspecified: Secondary | ICD-10-CM

## 2019-07-02 DIAGNOSIS — I779 Disorder of arteries and arterioles, unspecified: Secondary | ICD-10-CM

## 2019-07-02 DIAGNOSIS — M542 Cervicalgia: Secondary | ICD-10-CM

## 2019-07-28 ENCOUNTER — Other Ambulatory Visit: Payer: Self-pay | Admitting: Family Medicine

## 2019-07-28 DIAGNOSIS — M545 Low back pain, unspecified: Secondary | ICD-10-CM

## 2019-07-29 ENCOUNTER — Ambulatory Visit
Admission: RE | Admit: 2019-07-29 | Discharge: 2019-07-29 | Disposition: A | Discharge status: Home / Self Care | Payer: Medicare PPO | Source: Ambulatory Visit | Attending: Family Medicine | Admitting: Family Medicine

## 2019-07-29 ENCOUNTER — Other Ambulatory Visit: Payer: Self-pay

## 2019-07-29 ENCOUNTER — Other Ambulatory Visit: Payer: Medicare PPO

## 2019-07-29 DIAGNOSIS — M545 Low back pain, unspecified: Secondary | ICD-10-CM

## 2019-07-30 ENCOUNTER — Other Ambulatory Visit: Payer: Self-pay | Admitting: Family Medicine

## 2019-07-30 DIAGNOSIS — I639 Cerebral infarction, unspecified: Secondary | ICD-10-CM

## 2019-08-25 ENCOUNTER — Other Ambulatory Visit: Payer: Self-pay

## 2019-08-25 ENCOUNTER — Ambulatory Visit
Admission: RE | Admit: 2019-08-25 | Discharge: 2019-08-25 | Disposition: A | Discharge status: Home / Self Care | Payer: Medicare PPO | Source: Ambulatory Visit | Attending: Family Medicine | Admitting: Family Medicine

## 2019-08-25 ENCOUNTER — Other Ambulatory Visit: Payer: Medicare PPO

## 2019-08-25 DIAGNOSIS — M542 Cervicalgia: Secondary | ICD-10-CM

## 2019-09-01 ENCOUNTER — Other Ambulatory Visit: Payer: Self-pay | Admitting: Neurological Surgery

## 2019-09-01 DIAGNOSIS — M810 Age-related osteoporosis without current pathological fracture: Secondary | ICD-10-CM

## 2019-09-14 ENCOUNTER — Emergency Department (HOSPITAL_COMMUNITY)
Admission: EM | Admit: 2019-09-14 | Discharge: 2019-09-14 | Disposition: A | Discharge status: Home / Self Care | Payer: Medicare PPO | Attending: Emergency Medicine | Admitting: Emergency Medicine

## 2019-09-14 ENCOUNTER — Encounter (HOSPITAL_COMMUNITY): Payer: Self-pay

## 2019-09-14 ENCOUNTER — Other Ambulatory Visit: Payer: Self-pay

## 2019-09-14 DIAGNOSIS — I10 Essential (primary) hypertension: Secondary | ICD-10-CM | POA: Insufficient documentation

## 2019-09-14 DIAGNOSIS — Z85528 Personal history of other malignant neoplasm of kidney: Secondary | ICD-10-CM | POA: Diagnosis not present

## 2019-09-14 DIAGNOSIS — Z96642 Presence of left artificial hip joint: Secondary | ICD-10-CM | POA: Insufficient documentation

## 2019-09-14 DIAGNOSIS — Z87891 Personal history of nicotine dependence: Secondary | ICD-10-CM | POA: Insufficient documentation

## 2019-09-14 DIAGNOSIS — Z96651 Presence of right artificial knee joint: Secondary | ICD-10-CM | POA: Diagnosis not present

## 2019-09-14 DIAGNOSIS — Z7982 Long term (current) use of aspirin: Secondary | ICD-10-CM | POA: Insufficient documentation

## 2019-09-14 DIAGNOSIS — Z79899 Other long term (current) drug therapy: Secondary | ICD-10-CM | POA: Diagnosis not present

## 2019-09-14 DIAGNOSIS — Z8546 Personal history of malignant neoplasm of prostate: Secondary | ICD-10-CM | POA: Diagnosis not present

## 2019-09-14 DIAGNOSIS — E876 Hypokalemia: Secondary | ICD-10-CM | POA: Diagnosis not present

## 2019-09-14 LAB — CBC
HCT: 37.3 % — ABNORMAL LOW (ref 39.0–52.0)
Hemoglobin: 12.7 g/dL — ABNORMAL LOW (ref 13.0–17.0)
MCH: 32.6 pg (ref 26.0–34.0)
MCHC: 34 g/dL (ref 30.0–36.0)
MCV: 95.6 fL (ref 80.0–100.0)
Platelets: 255 10*3/uL (ref 150–400)
RBC: 3.9 MIL/uL — ABNORMAL LOW (ref 4.22–5.81)
RDW: 14.1 % (ref 11.5–15.5)
WBC: 5.6 10*3/uL (ref 4.0–10.5)
nRBC: 0 % (ref 0.0–0.2)

## 2019-09-14 LAB — COMPREHENSIVE METABOLIC PANEL
ALT: 27 U/L (ref 0–44)
AST: 28 U/L (ref 15–41)
Albumin: 4.1 g/dL (ref 3.5–5.0)
Alkaline Phosphatase: 62 U/L (ref 38–126)
Anion gap: 11 (ref 5–15)
BUN: 34 mg/dL — ABNORMAL HIGH (ref 8–23)
CO2: 34 mmol/L — ABNORMAL HIGH (ref 22–32)
Calcium: 9.8 mg/dL (ref 8.9–10.3)
Chloride: 98 mmol/L (ref 98–111)
Creatinine, Ser: 1.51 mg/dL — ABNORMAL HIGH (ref 0.61–1.24)
GFR calc Af Amer: 53 mL/min — ABNORMAL LOW (ref >60)
GFR calc non Af Amer: 46 mL/min — ABNORMAL LOW (ref >60)
Glucose, Bld: 227 mg/dL — ABNORMAL HIGH (ref 70–99)
Potassium: 2.7 mmol/L — CL (ref 3.5–5.1)
Sodium: 143 mmol/L (ref 135–145)
Total Bilirubin: 0.6 mg/dL (ref 0.3–1.2)
Total Protein: 7.1 g/dL (ref 6.5–8.1)

## 2019-09-14 MED ORDER — POTASSIUM CHLORIDE CRYS ER 20 MEQ PO TBCR
60.0000 meq | EXTENDED_RELEASE_TABLET | Freq: Once | ORAL | Status: AC
  Administered 2019-09-14: 60 meq via ORAL
  Filled 2019-09-14: qty 3

## 2019-09-14 NOTE — ED Notes (Addendum)
Registration at bedside, discharge paperwork has been reviewed.

## 2019-09-14 NOTE — Discharge Instructions (Addendum)
Your potassium was 2.7 today.  Your creatinine was 1.54.  Drink plenty of liquids and have your doctor repeat your potassium this week.  Take your current dose of potassium as directed

## 2019-09-14 NOTE — ED Provider Notes (Signed)
[facility] DEPT Provider Note   CSN: [id number] Arrival date & time: 09/14/19  1754     History Chief Complaint  Patient presents with  . Abnormal Lab    Jonathan Harvey is a 74 y.o. male.  74 year old male who presents due to decreased potassium of 2.7.  He denies any history of volume loss.  States that he was having preop testing and was called this evening due to that value.  Patient is on 2 types of diuretics.  Denies any changes to his regimen.  No treatment use prior to arrival.  Has had similar symptoms in the past prior to having surgery        Past Medical History:  Diagnosis Date  . Arthritis    "all over" (12/25/2016)  . Chronic lower back pain    "severe" (12/25/2016)  . Hepatitis B 2008  . History of kidney stones   . Hypertension   . Hypertriglyceridemia   . Prostate cancer ([place]) 10/2006   s/p robotic prostatectomy   . Renal cell carcinoma, right ([place])   . Spinal stenosis     Patient Active Problem List   Diagnosis Date Noted  . Renal cell carcinoma of right kidney ([place]) 02/01/2017  . Postoperative anemia due to acute blood loss 02/01/2017  . Renal mass 01/30/2017  . 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 Primary osteoarthritis of left hip 12/25/2016  . Primary localized osteoarthritis of right knee 08/26/2016  . Primary osteoarthritis of right knee 08/25/2016  . Status post revision of total hip 04/24/2016  . ED (erectile dysfunction) of organic origin 07/01/2012  . Malignant neoplasm of prostate ([place]) 07/01/2012  . Microscopic hematuria 07/01/2012  . Cervical stenosis of spinal canal 01/10/2012    Past Surgical History:  Procedure Laterality Date  . CARPAL TUNNEL RELEASE Right 2010  . CYST EXCISION Left    elbow  . FEMUR SURGERY Left ~ 1962   "femur was growing crooked; broke it to straighten it out; body cast for ~ 4 months"  . FINGER SURGERY Right 1987   right thumb and index finger (pt  denies this hx on 12/25/2016)  . FRACTURE SURGERY    . INGUINAL HERNIA REPAIR Left 2008  . JOINT REPLACEMENT    . MASTOIDECTOMY  1982  . ORIF GREATER TROCHANTERIC FRACTURE Left 12/25/2016    ACETABULAR REVISION/notes 12/25/2016  . PROSTATECTOMY  10/2006  . ROBOTIC ASSITED PARTIAL NEPHRECTOMY Right 01/30/2017   Procedure: ROBOTIC ASSITED PARTIAL NEPHRECTOMY;  Surgeon: Ardis Hughs, MD;  Location: [facility];  Service: Urology;  Laterality: Right;  . TOTAL HIP ARTHROPLASTY Left 2002  . TOTAL HIP REVISION Left 04/24/2016   Procedure: TOTAL HIP REVISION;  Surgeon: [name], MD;  Location: [facility];  Service: Orthopedics;  Laterality: Left;  . TOTAL HIP REVISION Left 12/25/2016   Procedure: OPEN REDUCTION INTERNAL FIXATION GREATER TROCHANTERIC FRACTURE, ACETABULAR REVISION;  Surgeon: [name], MD;  Location: [facility];  Service: Orthopedics;  Laterality: Left;  . TOTAL KNEE ARTHROPLASTY Right 08/26/2016   Procedure: RIGHT TOTAL KNEE ARTHROPLASTY;  Surgeon: [name], MD;  Location: [facility];  Service: Orthopedics;  Laterality: Right;       History reviewed. No pertinent family history.  Social History   Tobacco Use  . Smoking status: Former Smoker    Years: 20.00    Types: Pipe    Quit date: 1991    Years since quitting: 30.5  . Smokeless tobacco: Never Used  Vaping Use  . Vaping Use: Never used  Substance Use Topics  . Alcohol use: Yes    Alcohol/week: 3.0 standard drinks    Types: 1 Glasses of wine, 2 Cans of beer per week    Comment: daily  . Drug use: Not Currently    Types: Marijuana    Comment: as college student    Home Medications Prior to Admission medications   Medication Sig Start Date End Date Taking? Authorizing Provider  aspirin EC 81 MG tablet Take 81 mg by mouth daily.    [provider]  cetirizine-pseudoephedrine (ZYRTEC-D) 5-120 MG tablet Take 1 tablet by mouth at bedtime.    [provider]  Cholecalciferol (VITAMIN D3) 3000 units  TABS Take 2,000 Units by mouth.    [provider]  Cyanocobalamin (VITAMIN B 12 PO) Take by mouth daily.    [provider]  furosemide (LASIX) 40 MG tablet Take 20 mg by mouth daily.     [provider]  gabapentin (NEURONTIN) 300 MG capsule Take 300 mg See admin instructions by mouth. Morning, Lunch,  (may take dose if needed at bedtime)    [provider]  losartan-hydrochlorothiazide (HYZAAR) 100-25 MG tablet Take 1 tablet by mouth daily.    [provider]  metoprolol succinate (TOPROL-XL) 25 MG 24 hr tablet Take 25 mg by mouth daily.    [provider]  Multiple Vitamin (MULTIVITAMIN) tablet Take 1 tablet by mouth daily.    [provider]  simvastatin (ZOCOR) 40 MG tablet Take 20 mg by mouth at bedtime.    [provider]  traMADol (ULTRAM) 50 MG tablet Take 1-2 tablets (50-100 mg total) by mouth every 6 (six) hours as needed for moderate pain or severe pain (for pain). 01/30/17   Debbrah Alar, PA-C    Allergies    Lactose intolerance (gi)  Review of Systems   Review of Systems  All other systems reviewed and are negative.   Physical Exam Updated Vital Signs BP (!) 118/52 (BP Location: Right Arm)   Pulse 82   Temp 98.2 F (36.8 C) (Oral)   Resp 18   Ht 1.727 m (5\' 8" )   Wt 86.6 kg   SpO2 100%   BMI 29.04 kg/m   Physical Exam Vitals and nursing note reviewed.  Constitutional:      General: He is not in acute distress.    Appearance: Normal appearance. He is well-developed. He is not toxic-appearing.  HENT:     Head: Normocephalic and atraumatic.  Eyes:     General: Lids are normal.     Conjunctiva/sclera: Conjunctivae normal.     Pupils: Pupils are equal, round, and reactive to light.  Neck:     Thyroid: No thyroid mass.     Trachea: No tracheal deviation.  Cardiovascular:     Rate and Rhythm: Normal rate and regular rhythm.     Heart sounds: Normal heart sounds. No murmur heard.  No  gallop.   Pulmonary:     Effort: Pulmonary effort is normal. No respiratory distress.     Breath sounds: Normal breath sounds. No stridor. No decreased breath sounds, wheezing, rhonchi or rales.  Abdominal:     General: Bowel sounds are normal. There is no distension.     Palpations: Abdomen is soft.     Tenderness: There is no abdominal tenderness. There is no rebound.  Musculoskeletal:        General: No tenderness. Normal range of motion.  Cervical back: Normal range of motion and neck supple.  Skin:    General: Skin is warm and dry.     Findings: No abrasion or rash.  Neurological:     Mental Status: He is alert and oriented to person, place, and time.     GCS: GCS eye subscore is 4. GCS verbal subscore is 5. GCS motor subscore is 6.     Cranial Nerves: No cranial nerve deficit.     Sensory: No sensory deficit.  Psychiatric:        Speech: Speech normal.        Behavior: Behavior normal.     ED Results / Procedures / Treatments   Labs (all labs ordered are listed, but only abnormal results are displayed) Labs Reviewed  COMPREHENSIVE METABOLIC PANEL - Abnormal; Notable for the following components:      Result Value   Potassium 2.7 (*)    CO2 34 (*)    Glucose, Bld 227 (*)    BUN 34 (*)    Creatinine, Ser 1.51 (*)    GFR calc non Af Amer 46 (*)    GFR calc Af Amer 53 (*)    All other components within normal limits  CBC - Abnormal; Notable for the following components:   RBC 3.90 (*)    Hemoglobin 12.7 (*)    HCT 37.3 (*)    All other components within normal limits    EKG None  Radiology No results found.  Procedures Procedures (including critical care time)  Medications Ordered in ED Medications  potassium chloride SA (KLOR-CON) CR tablet 60 mEq (has no administration in time range)    ED Course  I have reviewed the triage vital signs and the nursing notes.  Pertinent labs & imaging results that were available during my care of the patient were  reviewed by me and considered in my medical decision making (see chart for details).    MDM Rules/Calculators/A&P                          Patient has potassium here of 2.7.  Was given 60 mEq p.o. and patient does have a history of mild renal insufficiency.  Patient instructed to have his potassium repeated this week.  He will continue to take his daily potassium tablets.  Return precautions given Final Clinical Impression(s) / ED Diagnoses Final diagnoses:  None    Rx / DC Orders ED Discharge Orders    None       Lacretia Leigh, MD 09/14/19 2241

## 2019-09-14 NOTE — ED Triage Notes (Signed)
Patient states he was getting pre op labs today for cervical disc surgery. Patient states he was called and notified that his K+-2.7.

## 2019-12-06 ENCOUNTER — Other Ambulatory Visit: Payer: Self-pay

## 2019-12-06 ENCOUNTER — Ambulatory Visit: Payer: Medicare PPO | Attending: Neurological Surgery | Admitting: Physical Therapy

## 2019-12-06 DIAGNOSIS — R293 Abnormal posture: Secondary | ICD-10-CM | POA: Diagnosis present

## 2019-12-06 DIAGNOSIS — M542 Cervicalgia: Secondary | ICD-10-CM | POA: Diagnosis not present

## 2019-12-06 NOTE — Therapy (Signed)
[facility] [place], [place], [address] Phone: [phone]   Fax:  [phone]  Physical Therapy Evaluation  Patient Details  Name: Jonathan Harvey MRN: [id number] Date of Birth: 20-Dec-1945 Referring Provider (PT): Gara Kroner MD   Encounter Date: 12/06/2019   PT End of Session - 12/06/19 1229    Visit Number 1    Number of Visits 12    Date for PT Re-Evaluation 02/14/20    Authorization Type FOTO.    PT Start Time 1030    PT Stop Time 1101    PT Time Calculation (min) 31 min    Activity Tolerance Patient tolerated treatment well    Behavior During Therapy WFL for tasks assessed/performed           Past Medical History:  Diagnosis Date  . Arthritis    "all over" (12/25/2016)  . Chronic lower back pain    "severe" (12/25/2016)  . Hepatitis B 2008  . History of kidney stones   . Hypertension   . Hypertriglyceridemia   . Prostate cancer ([facility]) 10/2006   s/p robotic prostatectomy   . Renal cell carcinoma, right ([place])   . Spinal stenosis     Past Surgical History:  Procedure Laterality Date  . CARPAL TUNNEL RELEASE Right 2010  . CYST EXCISION Left    elbow  . FEMUR SURGERY Left ~ 1962   "femur was growing crooked; broke it to straighten it out; body cast for ~ 4 months"  . FINGER SURGERY Right 1987   right thumb and index finger (pt denies this hx on 12/25/2016)  . FRACTURE SURGERY    . INGUINAL HERNIA REPAIR Left 2008  . JOINT REPLACEMENT    . MASTOIDECTOMY  1982  . ORIF GREATER TROCHANTERIC FRACTURE Left 12/25/2016    ACETABULAR REVISION/notes 12/25/2016  . PROSTATECTOMY  10/2006  . ROBOTIC ASSITED PARTIAL NEPHRECTOMY Right 01/30/2017   Procedure: ROBOTIC ASSITED PARTIAL NEPHRECTOMY;  Surgeon: Ardis Hughs, MD;  [facility];  Service: Urology;  Laterality: Right;  . TOTAL HIP ARTHROPLASTY Left 2002  . TOTAL HIP REVISION Left 04/24/2016   Procedure: TOTAL HIP REVISION;  Surgeon: [name], MD;  Location: [facility];  Service: Orthopedics;  Laterality: Left;  . TOTAL HIP REVISION Left 12/25/2016   Procedure: OPEN REDUCTION INTERNAL FIXATION GREATER TROCHANTERIC FRACTURE, ACETABULAR REVISION;  Surgeon: [name], MD;  Location: [facility];  Service: Orthopedics;  Laterality: Left;  . TOTAL KNEE ARTHROPLASTY Right 08/26/2016   Procedure: RIGHT TOTAL KNEE ARTHROPLASTY;  Surgeon: [name], MD;  Location: [facility];  Service: Orthopedics;  Laterality: Right;    There were no vitals filed for this visit.    Subjective Assessment - 12/06/19 1239    Subjective COVID-19 screen performed prior to patient entering clinic. The patient presents to the clinic today s/p posterior cervical laminectomy and fusion with instrumentation levels C3-C7 performed on 09/22/19.  he is about 11 weeks post-op now and isvery pleased with his progress.  He has been doing more around the house ans was recently on his tractor with a neck brace.  He states his lifting limit is 25# but may have went over this a time or two.  His current pain-level is a 6/10 today.  Pain medication and rest decrease his pain.  He has been using a bone stimulator as well at home.  He has been moving his neck some as well.    Pertinent History controlled HTN, chronic lower  back pain, right RTC injury, right TKA, left THA, femural surgery.    Patient Stated Goals Stay as active as possible.    Currently in Pain? Yes    Pain Location Neck    Pain Orientation Right;Left    Pain Descriptors / Indicators Sharp    Pain Type Surgical pain    Pain Onset More than a month ago    Pain Frequency Constant    Aggravating Factors  Strenuous activity.    Pain Relieving Factors See above.              [facility] PT Assessment - 12/06/19 0001      Assessment   Medical Diagnosis S/p cervical spinal fusion.    Referring Provider (PT) Gara Kroner MD    Next MD Visit 09/22/19 (surgery date).      Precautions   Precaution Comments Follow  cervical fusion protocol.      Restrictions   Weight Bearing Restrictions No      Balance Screen   Has the patient fallen in the past 6 months No    Has the patient had a decrease in activity level because of a fear of falling?  No    Is the patient reluctant to leave their home because of a fear of falling?  No      Home Environment   Living Environment Private residence      Prior Function   Level of Independence Independent      Observation/Other Assessments   Focus on Therapeutic Outcomes (FOTO)  68% limitation.      Posture/Postural Control   Posture/Postural Control Postural limitations    Postural Limitations Rounded Shoulders;Forward head      Deep Tendon Reflexes   DTR Assessment Site Biceps;Brachioradialis;Triceps    Biceps DTR 1+    Brachioradialis DTR 1+    Triceps DTR 1+      AROM   Overall AROM Comments Bilateral active cervical rotation with no pain increase to 25 degrees.      Strength   Overall Strength Comments Normal bilateral elbow and wrist strength.  The patient is right hand dominant and his grip on right= 40# and on left= 35# on the second standardized position of the hand dynamometer.      Palpation   Palpation comment Bilateral bilateral cervical paraspinal musculature tenderness with increased tone right > left.  Mild bilateral UT tenderness.  Incision looks to be well healed.      Ambulation/Gait   Gait Comments Gait deviation due to previous LE injuries and surgeries.                      Objective measurements completed on examination: See above findings.                    PT Long Term Goals - 12/06/19 1258      PT LONG TERM GOAL #1   Title Patient will be independent with HEP    Time 8    Period Weeks    Status New      PT LONG TERM GOAL #2   Title Patient able to perform ADL's wiht pain not > 2-3/10.    Time 8    Period Weeks    Status New                  Plan - 12/06/19 1253     Clinical Impression Statement The patient presenst to OPPT s/p  C3 to C7 posterior cervical laminectomy and fusion with insrumentation performed on 09/22/19.  The patient is doing very well thus far.  As expected his has a significant loss of active cervical rotation and associated increased tone in his cervical paraspinal musculature.  His bilateral elbow, wrist and grip strength is very good.  He is becoming more active around his home.  He is compliant with using the bone stimulator.Patient will benefit from skilled physical therapy intervention to address deficits and pain.    Personal Factors and Comorbidities Comorbidity 1;Comorbidity 2    Comorbidities controlled HTN, chronic lower back pain, right RTC injury, right TKA, left THA, femural surgery.    Examination-Activity Limitations Other    Examination-Participation Restrictions Other    Stability/Clinical Decision Making Stable/Uncomplicated    Clinical Decision Making Low    Rehab Potential Excellent    PT Frequency --   12 visits.   PT Treatment/Interventions ADLs/Self Care Home Management;Electrical Stimulation;Moist Heat;Therapeutic exercise;Therapeutic activities;Patient/family education;Manual techniques    PT Next Visit Plan Progress per cervical fusion protocol.  STW/M to cervical musculature.  Low-level electrical stimulation as needed.    Consulted and Agree with Plan of Care Patient           Patient will benefit from skilled therapeutic intervention in order to improve the following deficits and impairments:  Pain, Decreased activity tolerance, Decreased range of motion, Increased muscle spasms, Postural dysfunction  Visit Diagnosis: Cervicalgia - Plan: PT plan of care cert/re-cert  Abnormal posture - Plan: PT plan of care cert/re-cert     Problem List Patient Active Problem List   Diagnosis Date Noted  . Renal cell carcinoma of right kidney ([place]) 02/01/2017  . Postoperative anemia due to acute blood loss 02/01/2017   . Renal mass 01/30/2017  . 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 Primary osteoarthritis of left hip 12/25/2016  . Primary localized osteoarthritis of right knee 08/26/2016  . Primary osteoarthritis of right knee 08/25/2016  . Status post revision of total hip 04/24/2016  . ED (erectile dysfunction) of organic origin 07/01/2012  . Malignant neoplasm of prostate ([place]) 07/01/2012  . Microscopic hematuria 07/01/2012  . Cervical stenosis of spinal canal 01/10/2012    Tommi Crepeau, [place] MPT 12/06/2019, 1:00 PM  [facility] [address] [place], [place], [address] Phone: [phone]   Fax:  [phone]  Name: ANIKETH HUBERTY MRN: [id number] Date of Birth: February 21, 1946

## 2019-12-13 ENCOUNTER — Other Ambulatory Visit: Payer: Self-pay

## 2019-12-13 ENCOUNTER — Ambulatory Visit: Payer: Medicare PPO | Admitting: Physical Therapy

## 2019-12-13 DIAGNOSIS — M542 Cervicalgia: Secondary | ICD-10-CM

## 2019-12-13 DIAGNOSIS — R293 Abnormal posture: Secondary | ICD-10-CM

## 2019-12-13 NOTE — Therapy (Signed)
[facility] [place], [place], [address] Phone: [phone]   Fax:  [phone]  Physical Therapy Treatment  Patient Details  Name: Jonathan Harvey MRN: [id number] Date of Birth: 1945/07/17 Referring Provider (PT): Gara Kroner MD   Encounter Date: 12/13/2019   PT End of Session - 12/13/19 1136    Visit Number 2    Number of Visits 12    Date for PT Re-Evaluation 02/14/20    Authorization Type FOTO.    PT Start Time 1030    PT Stop Time 1101    PT Time Calculation (min) 31 min    Activity Tolerance Patient tolerated treatment well    Behavior During Therapy WFL for tasks assessed/performed           Past Medical History:  Diagnosis Date  . Arthritis    "all over" (12/25/2016)  . Chronic lower back pain    "severe" (12/25/2016)  . Hepatitis B 2008  . History of kidney stones   . Hypertension   . Hypertriglyceridemia   . Prostate cancer ([place]) 10/2006   s/p robotic prostatectomy   . Renal cell carcinoma, right ([place])   . Spinal stenosis     Past Surgical History:  Procedure Laterality Date  . CARPAL TUNNEL RELEASE Right 2010  . CYST EXCISION Left    elbow  . FEMUR SURGERY Left ~ 1962   "femur was growing crooked; broke it to straighten it out; body cast for ~ 4 months"  . FINGER SURGERY Right 1987   right thumb and index finger (pt denies this hx on 12/25/2016)  . FRACTURE SURGERY    . INGUINAL HERNIA REPAIR Left 2008  . JOINT REPLACEMENT    . MASTOIDECTOMY  1982  . ORIF GREATER TROCHANTERIC FRACTURE Left 12/25/2016    ACETABULAR REVISION/notes 12/25/2016  . PROSTATECTOMY  10/2006  . ROBOTIC ASSITED PARTIAL NEPHRECTOMY Right 01/30/2017   Procedure: ROBOTIC ASSITED PARTIAL NEPHRECTOMY;  Surgeon: Ardis Hughs, MD;  [facility];  Service: Urology;  Laterality: Right;  . TOTAL HIP ARTHROPLASTY Left 2002  . TOTAL HIP REVISION Left 04/24/2016   Procedure: TOTAL HIP REVISION;  Surgeon: [name], MD;  Location: [facility];  Service: Orthopedics;  Laterality: Left;  . TOTAL HIP REVISION Left 12/25/2016   Procedure: OPEN REDUCTION INTERNAL FIXATION GREATER TROCHANTERIC FRACTURE, ACETABULAR REVISION;  Surgeon: [name], MD;  Location: [facility];  Service: Orthopedics;  Laterality: Left;  . TOTAL KNEE ARTHROPLASTY Right 08/26/2016   Procedure: RIGHT TOTAL KNEE ARTHROPLASTY;  Surgeon: [name], MD;  Location: [facility];  Service: Orthopedics;  Laterality: Right;    There were no vitals filed for this visit.   Subjective Assessment - 12/13/19 1131    Subjective COVID-19 screen performed prior to patient entering clinic.  Did [place] working over the weekend.    Pertinent History controlled HTN, chronic lower back pain, right RTC injury, right TKA, left THA, femural surgery.    Limitations Lifting;House hold activities    Diagnostic tests MRI: RTC tendon tear per patient    Patient Stated Goals Stay as active as possible.    Currently in Pain? Yes    Pain Score 4     Pain Location Neck    Pain Orientation Right;Left    Pain Descriptors / Indicators Sharp    Pain Type Surgical pain    Pain Onset More than a month ago  [facility] PT Treatment/Exercise - 12/13/19 0001      Manual Therapy   Manual Therapy Soft tissue mobilization    Soft tissue mobilization STW/M to bilateral cervical musculature x 24 minutes to reduce tone.                       PT Long Term Goals - 12/06/19 1258      PT LONG TERM GOAL #1   Title Patient will be independent with HEP    Time 8    Period Weeks    Status New      PT LONG TERM GOAL #2   Title Patient able to perform ADL's wiht pain not > 2-3/10.    Time 8    Period Weeks    Status New                 Plan - 12/13/19 1136    Clinical Impression Statement Patient doing very well and able to do some [place] working at home.  He did well with STW/M to his bilateral cervical  musculature today.    Personal Factors and Comorbidities Comorbidity 1;Comorbidity 2    Comorbidities controlled HTN, chronic lower back pain, right RTC injury, right TKA, left THA, femural surgery.    Examination-Activity Limitations Other    Stability/Clinical Decision Making Stable/Uncomplicated    Rehab Potential Excellent    PT Duration 6 weeks    PT Treatment/Interventions ADLs/Self Care Home Management;Electrical Stimulation;Moist Heat;Therapeutic exercise;Therapeutic activities;Patient/family education;Manual techniques    PT Next Visit Plan Progress per cervical fusion protocol.  STW/M to cervical musculature.  Low-level electrical stimulation as needed.    PT Home Exercise Plan --    Consulted and Agree with Plan of Care Patient           Patient will benefit from skilled therapeutic intervention in order to improve the following deficits and impairments:  Pain, Decreased activity tolerance, Decreased range of motion, Increased muscle spasms, Postural dysfunction  Visit Diagnosis: Cervicalgia  Abnormal posture     Problem List Patient Active Problem List   Diagnosis Date Noted  . Renal cell carcinoma of right kidney ([place]) 02/01/2017  . Postoperative anemia due to acute blood loss 02/01/2017  . Renal mass 01/30/2017  . 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 Primary osteoarthritis of left hip 12/25/2016  . Primary localized osteoarthritis of right knee 08/26/2016  . Primary osteoarthritis of right knee 08/25/2016  . Status post revision of total hip 04/24/2016  . ED (erectile dysfunction) of organic origin 07/01/2012  . Malignant neoplasm of prostate ([place]) 07/01/2012  . Microscopic hematuria 07/01/2012  . Cervical stenosis of spinal canal 01/10/2012    Mandela Bello, [place] MPT 12/13/2019, 11:38 AM  [facility] [address] [place], [place], [address] Phone: [phone]   Fax:  [phone]  Name: Jonathan Harvey MRN: [id number] Date of Birth: 05/27/1945

## 2019-12-20 ENCOUNTER — Ambulatory Visit: Payer: Medicare PPO | Admitting: Physical Therapy

## 2019-12-20 ENCOUNTER — Other Ambulatory Visit: Payer: Self-pay

## 2019-12-20 DIAGNOSIS — M542 Cervicalgia: Secondary | ICD-10-CM

## 2019-12-20 DIAGNOSIS — R293 Abnormal posture: Secondary | ICD-10-CM

## 2019-12-20 NOTE — Therapy (Signed)
[facility] [place], [place], [address] Phone: [phone]   Fax:  [phone]  Physical Therapy Treatment  Patient Details  Name: Jonathan Harvey MRN: [id number] Date of Birth: 1945/08/19 Referring Provider (PT): Gara Kroner MD   Encounter Date: 12/20/2019   PT End of Session - 12/20/19 1157    Visit Number 3    Number of Visits 12    Date for PT Re-Evaluation 02/14/20    Authorization Type FOTO.    PT Start Time 1118    PT Stop Time 1205    PT Time Calculation (min) 47 min    Activity Tolerance Patient tolerated treatment well    Behavior During Therapy WFL for tasks assessed/performed           Past Medical History:  Diagnosis Date  . Arthritis    "all over" (12/25/2016)  . Chronic lower back pain    "severe" (12/25/2016)  . Hepatitis B 2008  . History of kidney stones   . Hypertension   . Hypertriglyceridemia   . Prostate cancer ([place]) 10/2006   s/p robotic prostatectomy   . Renal cell carcinoma, right ([place])   . Spinal stenosis     Past Surgical History:  Procedure Laterality Date  . CARPAL TUNNEL RELEASE Right 2010  . CYST EXCISION Left    elbow  . FEMUR SURGERY Left ~ 1962   "femur was growing crooked; broke it to straighten it out; body cast for ~ 4 months"  . FINGER SURGERY Right 1987   right thumb and index finger (pt denies this hx on 12/25/2016)  . FRACTURE SURGERY    . INGUINAL HERNIA REPAIR Left 2008  . JOINT REPLACEMENT    . MASTOIDECTOMY  1982  . ORIF GREATER TROCHANTERIC FRACTURE Left 12/25/2016    ACETABULAR REVISION/notes 12/25/2016  . PROSTATECTOMY  10/2006  . ROBOTIC ASSITED PARTIAL NEPHRECTOMY Right 01/30/2017   Procedure: ROBOTIC ASSITED PARTIAL NEPHRECTOMY;  Surgeon: Ardis Hughs, MD;  Location: [facility];  Service: Urology;  Laterality: Right;  . TOTAL HIP ARTHROPLASTY Left 2002  . TOTAL HIP REVISION Left 04/24/2016   Procedure: TOTAL HIP REVISION;  Surgeon: [name], MD;  Location: [facility];  Service: Orthopedics;  Laterality: Left;  . TOTAL HIP REVISION Left 12/25/2016   Procedure: OPEN REDUCTION INTERNAL FIXATION GREATER TROCHANTERIC FRACTURE, ACETABULAR REVISION;  Surgeon: [name], MD;  Location: [facility];  Service: Orthopedics;  Laterality: Left;  . TOTAL KNEE ARTHROPLASTY Right 08/26/2016   Procedure: RIGHT TOTAL KNEE ARTHROPLASTY;  Surgeon: [name], MD;  Location: [facility];  Service: Orthopedics;  Laterality: Right;    There were no vitals filed for this visit.   Subjective Assessment - 12/20/19 1154    Subjective COVID-19 screen performed prior to patient entering clinic.  Been overdoing at home.  Using a drill for a couple hours.  Pain was up to 7-8/10.    Pertinent History controlled HTN, chronic lower back pain, right RTC injury, right TKA, left THA, femural surgery.    Limitations Lifting;House hold activities    Patient Stated Goals Stay as active as possible.    Currently in Pain? Yes    Pain Score 6     Pain Location Neck    Pain Orientation Right;Left    Pain Descriptors / Indicators Aching;Sharp    Pain Type Surgical pain  [facility] Adult PT Treatment/Exercise - 12/20/19 0001      Shoulder Exercises: ROM/Strengthening   UBE (Upper Arm Bike) 120 RPM's x 8 minutes.      Modalities   Modalities Electrical Stimulation;Moist Heat      Moist Heat Therapy   Number Minutes Moist Heat 15 Minutes    Moist Heat Location --   Bilateral cervical.     Electrical Stimulation   Electrical Stimulation Location Bilateral UT's.    Electrical Stimulation Action Low-level modulated x 15 minutes.      Manual Therapy   Manual Therapy Soft tissue mobilization    Soft tissue mobilization STW/M x 15 minutes to bilateral UT's x 15 minutes.                       PT Long Term Goals - 12/06/19 1258      PT LONG TERM GOAL #1   Title Patient will be independent with HEP    Time 8     Period Weeks    Status New      PT LONG TERM GOAL #2   Title Patient able to perform ADL's wiht pain not > 2-3/10.    Time 8    Period Weeks    Status New                 Plan - 12/20/19 1157    Clinical Impression Statement Patient with increased pain today due to home activites (ie:  using a drill for a couple hours).  Recommended he back down at home.  He is highly motivated to improve.  He did well with STW/M and increased tone was noted over his bilateral UT's.    Personal Factors and Comorbidities Comorbidity 1;Comorbidity 2    Comorbidities controlled HTN, chronic lower back pain, right RTC injury, right TKA, left THA, femural surgery.    Examination-Activity Limitations Other    Examination-Participation Restrictions Other    Stability/Clinical Decision Making Stable/Uncomplicated    Rehab Potential Excellent    PT Duration 6 weeks    PT Treatment/Interventions ADLs/Self Care Home Management;Electrical Stimulation;Moist Heat;Therapeutic exercise;Therapeutic activities;Patient/family education;Manual techniques    PT Next Visit Plan Progress per cervical fusion protocol.  STW/M to cervical musculature.  Low-level electrical stimulation as needed.    Consulted and Agree with Plan of Care Patient           Patient will benefit from skilled therapeutic intervention in order to improve the following deficits and impairments:     Visit Diagnosis: Cervicalgia  Abnormal posture     Problem List Patient Active Problem List   Diagnosis Date Noted  . Renal cell carcinoma of right kidney ([place]) 02/01/2017  . Postoperative anemia due to acute blood loss 02/01/2017  . Renal mass 01/30/2017  . 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 Primary osteoarthritis of left hip 12/25/2016  . Primary localized osteoarthritis of right knee 08/26/2016  . Primary osteoarthritis of right knee 08/25/2016  . Status post revision of total hip 04/24/2016  . ED (erectile  dysfunction) of organic origin 07/01/2012  . Malignant neoplasm of prostate ([place]) 07/01/2012  . Microscopic hematuria 07/01/2012  . Cervical stenosis of spinal canal 01/10/2012    Jonathan Harvey, Jonathan Harvey 12/20/2019, 12:19 PM  [facility] [address] [place], [place], [address] Phone: [phone]   Fax:  [phone]  Name: Jonathan Harvey MRN: [id number] Date of Birth: [birth year]

## 2019-12-27 ENCOUNTER — Ambulatory Visit: Payer: Medicare PPO | Admitting: Physical Therapy

## 2019-12-27 ENCOUNTER — Other Ambulatory Visit: Payer: Self-pay

## 2019-12-27 DIAGNOSIS — R293 Abnormal posture: Secondary | ICD-10-CM

## 2019-12-27 DIAGNOSIS — M542 Cervicalgia: Secondary | ICD-10-CM

## 2019-12-27 NOTE — Therapy (Signed)
[facility] [place], [place], [address] Phone: [phone]   Fax:  [phone]  Physical Therapy Treatment  Patient Details  Name: Jonathan Harvey MRN: [id number] Date of Birth: 1945-11-05 Referring Provider (PT): Gara Kroner MD   Encounter Date: 12/27/2019   PT End of Session - 12/27/19 1202    Visit Number 4    Number of Visits 12    Date for PT Re-Evaluation 02/14/20    Authorization Type FOTO.    PT Start Time 1115    PT Stop Time 1205    PT Time Calculation (min) 50 min    Activity Tolerance Patient tolerated treatment well    Behavior During Therapy WFL for tasks assessed/performed           Past Medical History:  Diagnosis Date  . Arthritis    "all over" (12/25/2016)  . Chronic lower back pain    "severe" (12/25/2016)  . Hepatitis B 2008  . History of kidney stones   . Hypertension   . Hypertriglyceridemia   . Prostate cancer ([place]) 10/2006   s/p robotic prostatectomy   . Renal cell carcinoma, right ([place])   . Spinal stenosis     Past Surgical History:  Procedure Laterality Date  . CARPAL TUNNEL RELEASE Right 2010  . CYST EXCISION Left    elbow  . FEMUR SURGERY Left ~ 1962   "femur was growing crooked; broke it to straighten it out; body cast for ~ 4 months"  . FINGER SURGERY Right 1987   right thumb and index finger (pt denies this hx on 12/25/2016)  . FRACTURE SURGERY    . INGUINAL HERNIA REPAIR Left 2008  . JOINT REPLACEMENT    . MASTOIDECTOMY  1982  . ORIF GREATER TROCHANTERIC FRACTURE Left 12/25/2016    ACETABULAR REVISION/notes 12/25/2016  . PROSTATECTOMY  10/2006  . ROBOTIC ASSITED PARTIAL NEPHRECTOMY Right 01/30/2017   Procedure: ROBOTIC ASSITED PARTIAL NEPHRECTOMY;  Surgeon: Ardis Hughs, MD;  [facility];  Service: Urology;  Laterality: Right;  . TOTAL HIP ARTHROPLASTY Left 2002  . TOTAL HIP REVISION Left 04/24/2016   Procedure: TOTAL HIP REVISION;  Surgeon: [name], MD;  Location: [facility];  Service: Orthopedics;  Laterality: Left;  . TOTAL HIP REVISION Left 12/25/2016   Procedure: OPEN REDUCTION INTERNAL FIXATION GREATER TROCHANTERIC FRACTURE, ACETABULAR REVISION;  Surgeon: [name], MD;  Location: [facility];  Service: Orthopedics;  Laterality: Left;  . TOTAL KNEE ARTHROPLASTY Right 08/26/2016   Procedure: RIGHT TOTAL KNEE ARTHROPLASTY;  Surgeon: [name], MD;  Location: [facility];  Service: Orthopedics;  Laterality: Right;    There were no vitals filed for this visit.   Subjective Assessment - 12/27/19 1153    Subjective COVID-19 screen performed prior to patient entering clinic.  Pain about a 3/10 right now.  Put up shelfs, did okay.    Pertinent History controlled HTN, chronic lower back pain, right RTC injury, right TKA, left THA, femural surgery.    Limitations Lifting;House hold activities    Diagnostic tests MRI: RTC tendon tear per patient    Patient Stated Goals Stay as active as possible.    Currently in Pain? Yes    Pain Score 3     Pain Location Neck    Pain Orientation Right;Left    Pain Descriptors / Indicators Aching;Sharp    Pain Type Surgical pain    Pain Onset More than a month ago  [facility] Adult PT Treatment/Exercise - 12/27/19 0001      Shoulder Exercises: Standing   Other Standing Exercises Green XTS seated lat pulldowns, scap retraction and forward punches to fatigue.      Shoulder Exercises: Pulleys   Flexion 5 minutes      Shoulder Exercises: ROM/Strengthening   UBE (Upper Arm Bike) 90 RPM's x 10 minutes.      Modalities   Modalities Electrical Stimulation;Moist Heat      Moist Heat Therapy   Number Minutes Moist Heat 15 Minutes    Moist Heat Location Cervical      Electrical Stimulation   Electrical Stimulation Location Bilateral UT's.    Electrical Stimulation Action Low-level pre-mod    Electrical Stimulation Parameters 80-150 Hz x 15 minutes (5 sec on and 5  sec off).    Electrical Stimulation Goals Tone;Pain      Manual Therapy   Manual Therapy Soft tissue mobilization    Soft tissue mobilization STW/M x 5 minutes to bilateral UT's.                       PT Long Term Goals - 12/06/19 1258      PT LONG TERM GOAL #1   Title Patient will be independent with HEP    Time 8    Period Weeks    Status New      PT LONG TERM GOAL #2   Title Patient able to perform ADL's wiht pain not > 2-3/10.    Time 8    Period Weeks    Status New                 Plan - 12/27/19 1200    Clinical Impression Statement Patient is highly motivated.  He did great with ther ex today and performed with excellent technique.    Personal Factors and Comorbidities Comorbidity 1;Comorbidity 2    Comorbidities controlled HTN, chronic lower back pain, right RTC injury, right TKA, left THA, femural surgery.    Examination-Activity Limitations Other    Examination-Participation Restrictions Other    Stability/Clinical Decision Making Stable/Uncomplicated    Rehab Potential Excellent    PT Duration 6 weeks    PT Treatment/Interventions ADLs/Self Care Home Management;Electrical Stimulation;Moist Heat;Therapeutic exercise;Therapeutic activities;Patient/family education;Manual techniques    PT Next Visit Plan Progress per cervical fusion protocol.  STW/M to cervical musculature.  Low-level electrical stimulation as needed.    Consulted and Agree with Plan of Care Patient           Patient will benefit from skilled therapeutic intervention in order to improve the following deficits and impairments:  Pain, Decreased activity tolerance, Decreased range of motion, Increased muscle spasms, Postural dysfunction  Visit Diagnosis: Cervicalgia  Abnormal posture     Problem List Patient Active Problem List   Diagnosis Date Noted  . Renal cell carcinoma of right kidney ([place]) 02/01/2017  . Postoperative anemia due to acute blood loss 02/01/2017  .  Renal mass 01/30/2017  . 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 Primary osteoarthritis of left hip 12/25/2016  . Primary localized osteoarthritis of right knee 08/26/2016  . Primary osteoarthritis of right knee 08/25/2016  . Status post revision of total hip 04/24/2016  . ED (erectile dysfunction) of organic origin 07/01/2012  . Malignant neoplasm of prostate ([place]) 07/01/2012  . Microscopic hematuria 07/01/2012  . Cervical stenosis of spinal canal 01/10/2012    Aquita Simmering, [place] MPT 12/27/2019, 12:05 PM  [facility] [place], [place], [address] Phone: [phone]   Fax:  [phone]  Name: DUILIO HERITAGE MRN: [id number] Date of Birth: 09-Mar-1945

## 2020-01-03 ENCOUNTER — Other Ambulatory Visit: Payer: Self-pay

## 2020-01-03 ENCOUNTER — Ambulatory Visit: Payer: Medicare PPO | Attending: Neurological Surgery | Admitting: Physical Therapy

## 2020-01-03 DIAGNOSIS — M542 Cervicalgia: Secondary | ICD-10-CM | POA: Diagnosis not present

## 2020-01-03 DIAGNOSIS — R293 Abnormal posture: Secondary | ICD-10-CM | POA: Diagnosis present

## 2020-01-03 NOTE — Therapy (Signed)
Jonathan Harvey, [place], [address] Phone: [phone]   Fax:  [phone]  Physical Therapy Treatment  Patient Details  Name: Jonathan Harvey MRN: [id number] Date of Birth: 1945-10-06 Referring Provider (PT): Gara Kroner MD   Encounter Date: 01/03/2020   PT End of Session - 01/03/20 1125    Visit Number 5    Number of Visits 12    Date for PT Re-Evaluation 02/14/20    Authorization Type FOTO.    PT Start Time 1116    PT Stop Time 1210    PT Time Calculation (min) 54 min           Past Medical History:  Diagnosis Date   Arthritis    "all over" (12/25/2016)   Chronic lower back pain    "severe" (12/25/2016)   Hepatitis B 2008   History of kidney stones    Hypertension    Hypertriglyceridemia    Prostate cancer ([place]) 10/2006   s/p robotic prostatectomy    Renal cell carcinoma, right ([place])    Spinal stenosis     Past Surgical History:  Procedure Laterality Date   CARPAL TUNNEL RELEASE Right 2010   CYST EXCISION Left    elbow   FEMUR SURGERY Left ~ 1962   "femur was growing crooked; broke it to straighten it out; body cast for ~ 4 months"   FINGER SURGERY Right 1987   right thumb and index finger (pt denies this hx on 12/25/2016)   FRACTURE SURGERY     INGUINAL HERNIA REPAIR Left 2008   JOINT REPLACEMENT     MASTOIDECTOMY  1982   ORIF GREATER TROCHANTERIC FRACTURE Left 12/25/2016    ACETABULAR REVISION/notes 12/25/2016   PROSTATECTOMY  10/2006   ROBOTIC ASSITED PARTIAL NEPHRECTOMY Right 01/30/2017   Procedure: ROBOTIC ASSITED PARTIAL NEPHRECTOMY;  Surgeon: Ardis Hughs, MD;  [facility];  Service: Urology;  Laterality: Right;   TOTAL HIP ARTHROPLASTY Left 2002   TOTAL HIP REVISION Left 04/24/2016   Procedure: TOTAL HIP REVISION;  Surgeon: [name], MD;  Location: [facility];  Service: Orthopedics;  Laterality: Left;   TOTAL HIP REVISION Left 12/25/2016    Procedure: OPEN REDUCTION INTERNAL FIXATION GREATER TROCHANTERIC FRACTURE, ACETABULAR REVISION;  Surgeon: [name], MD;  Location: [facility];  Service: Orthopedics;  Laterality: Left;   TOTAL KNEE ARTHROPLASTY Right 08/26/2016   Procedure: RIGHT TOTAL KNEE ARTHROPLASTY;  Surgeon: [name], MD;  Location: [facility];  Service: Orthopedics;  Laterality: Right;    There were no vitals filed for this visit.   Subjective Assessment - 01/03/20 1120    Subjective COVID-19 screen performed prior to patient entering clinic.  Pain on right about a 6 and left a 3.    Pertinent History controlled HTN, chronic lower back pain, right RTC injury, right TKA, left THA, femural surgery.    Limitations Lifting;House hold activities    Diagnostic tests MRI: RTC tendon tear per patient    Currently in Pain? Yes   See "subjective" section.   Pain Orientation Right;Left    Pain Onset More than a month ago                             [facility] Adult PT Treatment/Exercise - 01/03/20 0001      Exercises   Exercises Elbow      Elbow Exercises   Elbow Flexion Limitations 3# to fatigue bilateral.  Shoulder Exercises: Seated   Other Seated Exercises Blue XTS lat PD's to fatigue.f/b low elbow and high elbow to fatigue.      Shoulder Exercises: Pulleys   Flexion 5 minutes      Shoulder Exercises: ROM/Strengthening   UBE (Upper Arm Bike) 90 RPM's x 8 minutes.      Moist Heat Therapy   Number Minutes Moist Heat 15 Minutes    Moist Heat Location Cervical      Electrical Stimulation   Electrical Stimulation Location Bil UT's     Electrical Stimulation Action Low-level IFC    Electrical Stimulation Parameters 100% scan on 80-150 Hz     Electrical Stimulation Goals Tone;Pain      Manual Therapy   Manual Therapy Soft tissue mobilization    Soft tissue mobilization STW/M x 5 minutes to patient's right UT.                       PT Long Term Goals - 12/06/19 1258      PT LONG  TERM GOAL #1   Title Patient will be independent with HEP    Time 8    Period Weeks    Status New      PT LONG TERM GOAL #2   Title Patient able to perform ADL's wiht pain not > 2-3/10.    Time 8    Period Weeks    Status New                 Plan - 01/03/20 1202    Clinical Impression Statement Patient is very motivated.  Increased tone in his right UT today.  Performed STW/M to right UT to reduce tone.    Personal Factors and Comorbidities Comorbidity 1;Comorbidity 2    Comorbidities controlled HTN, chronic lower back pain, right RTC injury, right TKA, left THA, femural surgery.    Examination-Activity Limitations Other    Examination-Participation Restrictions Other    Stability/Clinical Decision Making Stable/Uncomplicated           Patient will benefit from skilled therapeutic intervention in order to improve the following deficits and impairments:  Pain, Decreased activity tolerance, Decreased range of motion, Increased muscle spasms, Postural dysfunction  Visit Diagnosis: Cervicalgia  Abnormal posture     Problem List Patient Active Problem List   Diagnosis Date Noted   Renal cell carcinoma of right kidney ([place]) 02/01/2017   Postoperative anemia due to acute blood loss 02/01/2017   Renal mass 01/30/2017   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/2016   Primary osteoarthritis of left hip 12/25/2016   Primary localized osteoarthritis of right knee 08/26/2016   Primary osteoarthritis of right knee 08/25/2016   Status post revision of total hip 04/24/2016   ED (erectile dysfunction) of organic origin 07/01/2012   Malignant neoplasm of prostate ([place]) 07/01/2012   Microscopic hematuria 07/01/2012   Cervical stenosis of spinal canal 01/10/2012    Drayce Tawil, [place] MPT 01/03/2020, 12:14 PM  [facility] [place], [place], [address] Phone: [phone]   Fax:  [phone]  Name: Jonathan Harvey MRN: [id number] Date of Birth: 29-Dec-1945

## 2020-01-10 ENCOUNTER — Ambulatory Visit: Payer: Medicare PPO | Admitting: Physical Therapy

## 2020-01-10 ENCOUNTER — Other Ambulatory Visit: Payer: Self-pay

## 2020-01-10 DIAGNOSIS — M542 Cervicalgia: Secondary | ICD-10-CM | POA: Diagnosis not present

## 2020-01-10 DIAGNOSIS — R293 Abnormal posture: Secondary | ICD-10-CM

## 2020-01-10 NOTE — Therapy (Signed)
[facility] [place], [place], [address] Phone: [phone]   Fax:  [phone]  Physical Therapy Treatment  Patient Details  Name: KAYRON HICKLIN MRN: [id number] Date of Birth: [birth year] Referring Provider (PT): Gara Kroner MD   Encounter Date: 01/10/2020   PT End of Session - 01/10/20 1158    Visit Number 6    Number of Visits 12    Date for PT Re-Evaluation 02/14/20    Authorization Type FOTO.    PT Start Time 1115    PT Stop Time 1211    PT Time Calculation (min) 56 min    Activity Tolerance Patient tolerated treatment well    Behavior During Therapy WFL for tasks assessed/performed           Past Medical History:  Diagnosis Date  . Arthritis    "all over" (12/25/2016)  . Chronic lower back pain    "severe" (12/25/2016)  . Hepatitis B 2008  . History of kidney stones   . Hypertension   . Hypertriglyceridemia   . Prostate cancer ([place]) 10/2006   s/p robotic prostatectomy   . Renal cell carcinoma, right ([place])   . Spinal stenosis     Past Surgical History:  Procedure Laterality Date  . CARPAL TUNNEL RELEASE Right 2010  . CYST EXCISION Left    elbow  . FEMUR SURGERY Left ~ 1962   "femur was growing crooked; broke it to straighten it out; body cast for ~ 4 months"  . FINGER SURGERY Right 1987   right thumb and index finger (pt denies this hx on 12/25/2016)  . FRACTURE SURGERY    . INGUINAL HERNIA REPAIR Left 2008  . JOINT REPLACEMENT    . MASTOIDECTOMY  1982  . ORIF GREATER TROCHANTERIC FRACTURE Left 12/25/2016    ACETABULAR REVISION/notes 12/25/2016  . PROSTATECTOMY  10/2006  . ROBOTIC ASSITED PARTIAL NEPHRECTOMY Right 01/30/2017   Procedure: ROBOTIC ASSITED PARTIAL NEPHRECTOMY;  Surgeon: Ardis Hughs, MD;  [facility]: [facility];  Service: Urology;  Laterality: Right;  . TOTAL HIP ARTHROPLASTY Left 2002  . TOTAL HIP REVISION Left 04/24/2016   Procedure: TOTAL HIP REVISION;  Surgeon: [name], MD;  Location: [place];  Service: Orthopedics;  Laterality: Left;  . TOTAL HIP REVISION Left 12/25/2016   Procedure: OPEN REDUCTION INTERNAL FIXATION GREATER TROCHANTERIC FRACTURE, ACETABULAR REVISION;  Surgeon: [name], MD;  Location: [place];  Service: Orthopedics;  Laterality: Left;  . TOTAL KNEE ARTHROPLASTY Right 08/26/2016   Procedure: RIGHT TOTAL KNEE ARTHROPLASTY;  Surgeon: [name], MD;  Location: [place];  Service: Orthopedics;  Laterality: Right;    There were no vitals filed for this visit.   Subjective Assessment - 01/10/20 1153    Subjective COVID-19 screen performed prior to patient entering clinic.  Right shoulder flared-up a bit due to some home activites over the weekend.                             [place] Adult PT Treatment/Exercise - 01/10/20 0001      Exercises   Exercises Shoulder      Elbow Exercises   Elbow Flexion Limitations 5# to fatigue.      Shoulder Exercises: Standing   Other Standing Exercises 2# shoulder shrugs and shoulder rolls to fatigue.    Other Standing Exercises Green XTS Tricep extensions to fatigue x 2.  Ball on wall exercise with both hands x 3  minutes.      Shoulder Exercises: Pulleys   Flexion 5 minutes      Shoulder Exercises: ROM/Strengthening   UBE (Upper Arm Bike) 90 RPM's x 10 minutes 5 minutes forward and 5 minutes backward).      Modalities   Modalities Electrical Stimulation;Moist Heat      Moist Heat Therapy   Number Minutes Moist Heat 20 Minutes    Moist Heat Location Cervical      Electrical Stimulation   Electrical Stimulation Location Bil UT's    Electrical Stimulation Action Low level IFC    Electrical Stimulation Parameters 80-150 Hz x 20 minutes on 100% scan x 200 minutes.    Electrical Stimulation Goals Tone;Pain                       PT Long Term Goals - 12/06/19 1258      PT LONG TERM GOAL #1   Title Patient will be independent with HEP    Time 8    Period Weeks     Status New      PT LONG TERM GOAL #2   Title Patient able to perform ADL's wiht pain not > 2-3/10.    Time 8    Period Weeks    Status New                 Plan - 01/10/20 1200    Clinical Impression Statement Patient progressing nicely.  Add ther ex today which patient performs very well.    Personal Factors and Comorbidities Comorbidity 1;Comorbidity 2    Comorbidities controlled HTN, chronic lower back pain, right RTC injury, right TKA, left THA, femural surgery.    Examination-Activity Limitations Other    Examination-Participation Restrictions Other    Stability/Clinical Decision Making Stable/Uncomplicated    Rehab Potential Excellent    PT Duration 6 weeks    PT Treatment/Interventions ADLs/Self Care Home Management;Electrical Stimulation;Moist Heat;Therapeutic exercise;Therapeutic activities;Patient/family education;Manual techniques    PT Next Visit Plan Progress per cervical fusion protocol.  STW/M to cervical musculature.  Low-level electrical stimulation as needed.    Consulted and Agree with Plan of Care Patient           Patient will benefit from skilled therapeutic intervention in order to improve the following deficits and impairments:  Pain, Decreased activity tolerance, Decreased range of motion, Increased muscle spasms, Postural dysfunction  Visit Diagnosis: Cervicalgia  Abnormal posture     Problem List Patient Active Problem List   Diagnosis Date Noted  . Renal cell carcinoma of right kidney ([place]) 02/01/2017  . Postoperative anemia due to acute blood loss 02/01/2017  . Renal mass 01/30/2017  . 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 Primary osteoarthritis of left hip 12/25/2016  . Primary localized osteoarthritis of right knee 08/26/2016  . Primary osteoarthritis of right knee 08/25/2016  . Status post revision of total hip 04/24/2016  . ED (erectile dysfunction) of organic origin 07/01/2012  . Malignant neoplasm of prostate  ([place]) 07/01/2012  . Microscopic hematuria 07/01/2012  . Cervical stenosis of spinal canal 01/10/2012    Renton Berkley, [place] MPT 01/10/2020, 12:13 PM  [facility] [address] [place], [place], [address] Phone: [phone]   Fax:  [phone]  Name: DENNISE BAMBER MRN: [id number] Date of Birth: 10/18/1945

## 2020-01-17 ENCOUNTER — Other Ambulatory Visit: Payer: Self-pay

## 2020-01-17 ENCOUNTER — Ambulatory Visit: Payer: Medicare PPO | Admitting: Physical Therapy

## 2020-01-17 DIAGNOSIS — R293 Abnormal posture: Secondary | ICD-10-CM

## 2020-01-17 DIAGNOSIS — M542 Cervicalgia: Secondary | ICD-10-CM

## 2020-01-17 NOTE — Therapy (Signed)
[facility] [place], [place], [address] Phone: [phone]   Fax:  [phone]  Physical Therapy Treatment  Patient Details  Name: Jonathan Harvey MRN: [id number] Date of Birth: 16-Aug-1945 Referring Provider (PT): Gara Kroner MD   Encounter Date: 01/17/2020   PT End of Session - 01/17/20 1159    Visit Number 7    Number of Visits 12    Date for PT Re-Evaluation 02/14/20    Authorization Type FOTO.    PT Start Time 1117    PT Stop Time 1207    PT Time Calculation (min) 50 min    Activity Tolerance Patient tolerated treatment well    Behavior During Therapy [facility] for tasks assessed/performed           Past Medical History:  Diagnosis Date  . Arthritis    "all over" (12/25/2016)  . Chronic lower back pain    "severe" (12/25/2016)  . Hepatitis B 2008  . History of kidney stones   . Hypertension   . Hypertriglyceridemia   . Prostate cancer ([place]) 10/2006   s/p robotic prostatectomy   . Renal cell carcinoma, right ([place])   . Spinal stenosis     Past Surgical History:  Procedure Laterality Date  . CARPAL TUNNEL RELEASE Right 2010  . CYST EXCISION Left    elbow  . FEMUR SURGERY Left ~ 1962   "femur was growing crooked; broke it to straighten it out; body cast for ~ 4 months"  . FINGER SURGERY Right 1987   right thumb and index finger (pt denies this hx on 12/25/2016)  . FRACTURE SURGERY    . INGUINAL HERNIA REPAIR Left 2008  . JOINT REPLACEMENT    . MASTOIDECTOMY  1982  . ORIF GREATER TROCHANTERIC FRACTURE Left 12/25/2016    ACETABULAR REVISION/notes 12/25/2016  . PROSTATECTOMY  10/2006  . ROBOTIC ASSITED PARTIAL NEPHRECTOMY Right 01/30/2017   Procedure: ROBOTIC ASSITED PARTIAL NEPHRECTOMY;  Surgeon: Ardis Hughs, MD;  [facility];  Service: Urology;  Laterality: Right;  . TOTAL HIP ARTHROPLASTY Left 2002  . TOTAL HIP REVISION Left 04/24/2016   Procedure: TOTAL HIP REVISION;  Surgeon: [name], MD;  Location: [place];  Service: Orthopedics;  Laterality: Left;  . TOTAL HIP REVISION Left 12/25/2016   Procedure: OPEN REDUCTION INTERNAL FIXATION GREATER TROCHANTERIC FRACTURE, ACETABULAR REVISION;  Surgeon: [name], MD;  Location: [place];  Service: Orthopedics;  Laterality: Left;  . TOTAL KNEE ARTHROPLASTY Right 08/26/2016   Procedure: RIGHT TOTAL KNEE ARTHROPLASTY;  Surgeon: [name], MD;  Location: [place];  Service: Orthopedics;  Laterality: Right;    There were no vitals filed for this visit.   Subjective Assessment - 01/17/20 1158    Subjective COVID-19 screen performed prior to patient entering clinic.  About the same.    Pertinent History controlled HTN, chronic lower back pain, right RTC injury, right TKA, left THA, femural surgery.    Limitations Lifting;House hold activities    Diagnostic tests MRI: RTC tendon tear per patient    Patient Stated Goals Stay as active as possible.    Currently in Pain? Yes   3 on right and 6 on left.   Pain Orientation Right;Left    Pain Type Surgical pain                             [facility] Adult PT Treatment/Exercise - 01/17/20 0001  Exercises   Exercises Shoulder      Shoulder Exercises: Pulleys   Flexion --   6 minutes.     Shoulder Exercises: ROM/Strengthening   UBE (Upper Arm Bike) 90 RPM's x 10 minutes.      Modalities   Modalities Electrical Stimulation;Moist Heat      Moist Heat Therapy   Number Minutes Moist Heat 15 Minutes    Moist Heat Location Cervical      Electrical Stimulation   Electrical Stimulation Location RT UT.    Electrical Stimulation Action Low-level     Electrical Stimulation Parameters 80-150 Hz x 15 minutes on scan    Electrical Stimulation Goals Tone;Pain      Manual Therapy   Manual Therapy Soft tissue mobilization    Soft tissue mobilization STW/M x 8 minutes to patient's right UT.                       PT Long Term Goals - 12/06/19 1258      PT  LONG TERM GOAL #1   Title Patient will be independent with HEP    Time 8    Period Weeks    Status New      PT LONG TERM GOAL #2   Title Patient able to perform ADL's wiht pain not > 2-3/10.    Time 8    Period Weeks    Status New                 Plan - 01/17/20 1202    Clinical Impression Statement The patient continues to have increased tone in his right UT.  He does well with STW/M.    Personal Factors and Comorbidities Comorbidity 1;Comorbidity 2    Comorbidities controlled HTN, chronic lower back pain, right RTC injury, right TKA, left THA, femural surgery.    Examination-Activity Limitations Other    Examination-Participation Restrictions Other    Stability/Clinical Decision Making Stable/Uncomplicated    Rehab Potential Excellent    PT Duration 6 weeks    PT Treatment/Interventions ADLs/Self Care Home Management;Electrical Stimulation;Moist Heat;Therapeutic exercise;Therapeutic activities;Patient/family education;Manual techniques    PT Next Visit Plan Progress per cervical fusion protocol.  STW/M to cervical musculature.  Low-level electrical stimulation as needed.    Consulted and Agree with Plan of Care Patient           Patient will benefit from skilled therapeutic intervention in order to improve the following deficits and impairments:  Pain, Decreased activity tolerance, Decreased range of motion, Increased muscle spasms, Postural dysfunction  Visit Diagnosis: Cervicalgia  Abnormal posture     Problem List Patient Active Problem List   Diagnosis Date Noted  . Renal cell carcinoma of right kidney ([place]) 02/01/2017  . Postoperative anemia due to acute blood loss 02/01/2017  . Renal mass 01/30/2017  . 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 Primary osteoarthritis of left hip 12/25/2016  . Primary localized osteoarthritis of right knee 08/26/2016  . Primary osteoarthritis of right knee 08/25/2016  . Status post revision of total hip  04/24/2016  . ED (erectile dysfunction) of organic origin 07/01/2012  . Malignant neoplasm of prostate ([place]) 07/01/2012  . Microscopic hematuria 07/01/2012  . Cervical stenosis of spinal canal 01/10/2012    Ramiyah Mcclenahan, [place] MPT 01/17/2020, 12:24 PM  [facility] [address] [place], [place], [address] Phone: [phone]   Fax:  [phone]  Name: Jonathan Harvey MRN: [id number] Date of Birth: [birth year]

## 2020-01-24 ENCOUNTER — Ambulatory Visit: Payer: Medicare PPO | Admitting: Physical Therapy

## 2020-01-31 ENCOUNTER — Ambulatory Visit: Payer: Medicare PPO | Admitting: Physical Therapy

## 2020-02-08 ENCOUNTER — Other Ambulatory Visit: Payer: Self-pay

## 2020-02-08 ENCOUNTER — Ambulatory Visit: Payer: Medicare PPO | Attending: Neurological Surgery | Admitting: Physical Therapy

## 2020-02-08 DIAGNOSIS — M542 Cervicalgia: Secondary | ICD-10-CM | POA: Diagnosis not present

## 2020-02-08 DIAGNOSIS — R293 Abnormal posture: Secondary | ICD-10-CM | POA: Diagnosis present

## 2020-02-08 NOTE — Therapy (Signed)
[facility] [place], [place], [address] Phone: [phone]   Fax:  [phone]  Physical Therapy Treatment  Patient Details  Name: Jonathan Harvey MRN: [id number] Date of Birth: Sep 10, 1945 Referring Provider (PT): Gara Kroner MD   Encounter Date: 02/08/2020   PT End of Session - 02/08/20 1305    Visit Number 8    Number of Visits 12    Date for PT Re-Evaluation 02/14/20    Authorization Type FOTO.    PT Start Time 1117    PT Stop Time 1209    PT Time Calculation (min) 52 min    Activity Tolerance Patient tolerated treatment well    Behavior During Therapy WFL for tasks assessed/performed           Past Medical History:  Diagnosis Date  . Arthritis    "all over" (12/25/2016)  . Chronic lower back pain    "severe" (12/25/2016)  . Hepatitis B 2008  . History of kidney stones   . Hypertension   . Hypertriglyceridemia   . Prostate cancer ([place]) 10/2006   s/p robotic prostatectomy   . Renal cell carcinoma, right ([place])   . Spinal stenosis     Past Surgical History:  Procedure Laterality Date  . CARPAL TUNNEL RELEASE Right 2010  . CYST EXCISION Left    elbow  . FEMUR SURGERY Left ~ 1962   "femur was growing crooked; broke it to straighten it out; body cast for ~ 4 months"  . FINGER SURGERY Right 1987   right thumb and index finger (pt denies this hx on 12/25/2016)  . FRACTURE SURGERY    . INGUINAL HERNIA REPAIR Left 2008  . JOINT REPLACEMENT    . MASTOIDECTOMY  1982  . ORIF GREATER TROCHANTERIC FRACTURE Left 12/25/2016    ACETABULAR REVISION/notes 12/25/2016  . PROSTATECTOMY  10/2006  . ROBOTIC ASSITED PARTIAL NEPHRECTOMY Right 01/30/2017   Procedure: ROBOTIC ASSITED PARTIAL NEPHRECTOMY;  Surgeon: Ardis Hughs, MD;  [facility];  Service: Urology;  Laterality: Right;  . TOTAL HIP ARTHROPLASTY Left 2002  . TOTAL HIP REVISION Left 04/24/2016   Procedure: TOTAL HIP REVISION;  Surgeon: [name], MD;  Location: [place];  Service: Orthopedics;  Laterality: Left;  . TOTAL HIP REVISION Left 12/25/2016   Procedure: OPEN REDUCTION INTERNAL FIXATION GREATER TROCHANTERIC FRACTURE, ACETABULAR REVISION;  Surgeon: [name], MD;  Location: [place];  Service: Orthopedics;  Laterality: Left;  . TOTAL KNEE ARTHROPLASTY Right 08/26/2016   Procedure: RIGHT TOTAL KNEE ARTHROPLASTY;  Surgeon: [name], MD;  Location: [place];  Service: Orthopedics;  Laterality: Right;    There were no vitals filed for this visit.   Subjective Assessment - 02/08/20 1308    Subjective COVID-19 screen performed prior to patient entering clinic.  Pain around a 4 to 5.    Pertinent History controlled HTN, chronic lower back pain, right RTC injury, right TKA, left THA, femural surgery.    Limitations Lifting;House hold activities    Diagnostic tests MRI: RTC tendon tear per patient    Patient Stated Goals Stay as active as possible.    Pain Score 5     Pain Location Neck                             [facility] Adult PT Treatment/Exercise - 02/08/20 0001      Shoulder Exercises: ROM/Strengthening   UBE (Upper Arm Bike) 90  RPM's x 10 minutes.      Modalities   Modalities Electrical Stimulation;Moist Heat      Moist Heat Therapy   Number Minutes Moist Heat 20 Minutes    Moist Heat Location Cervical      Electrical Stimulation   Electrical Stimulation Location Bil C-spine musculature.    Electrical Stimulation Action Low-level.    Electrical Stimulation Parameters 80-150 Hz x 20 minutes at 40% scan.    Electrical Stimulation Goals Tone;Pain      Manual Therapy   Manual Therapy Soft tissue mobilization    Soft tissue mobilization STW/M x 13 minutes to patient's bilateral cervical musculature to reduce pain and tone.                       PT Long Term Goals - 12/06/19 1258      PT LONG TERM GOAL #1   Title Patient will be independent with HEP    Time 8    Period Weeks     Status New      PT LONG TERM GOAL #2   Title Patient able to perform ADL's wiht pain not > 2-3/10.    Time 8    Period Weeks    Status New                 Plan - 02/08/20 1309    Clinical Impression Statement Patient pleased with his progress.  Overall he is experiencing a lowered pain-level while still performing ADL's as usual.    Personal Factors and Comorbidities Comorbidity 1;Comorbidity 2    Comorbidities controlled HTN, chronic lower back pain, right RTC injury, right TKA, left THA, femural surgery.    Examination-Activity Limitations Other    Examination-Participation Restrictions Other    Stability/Clinical Decision Making Stable/Uncomplicated    Rehab Potential Excellent    PT Duration 6 weeks    PT Treatment/Interventions ADLs/Self Care Home Management;Electrical Stimulation;Moist Heat;Therapeutic exercise;Therapeutic activities;Patient/family education;Manual techniques    PT Next Visit Plan Progress per cervical fusion protocol.  STW/M to cervical musculature.  Low-level electrical stimulation as needed.    Consulted and Agree with Plan of Care Patient           Patient will benefit from skilled therapeutic intervention in order to improve the following deficits and impairments:  Pain, Decreased activity tolerance, Decreased range of motion, Increased muscle spasms, Postural dysfunction  Visit Diagnosis: Cervicalgia  Abnormal posture     Problem List Patient Active Problem List   Diagnosis Date Noted  . Renal cell carcinoma of right kidney ([place]) 02/01/2017  . Postoperative anemia due to acute blood loss 02/01/2017  . Renal mass 01/30/2017  . 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 Primary osteoarthritis of left hip 12/25/2016  . Primary localized osteoarthritis of right knee 08/26/2016  . Primary osteoarthritis of right knee 08/25/2016  . Status post revision of total hip 04/24/2016  . ED (erectile dysfunction) of organic origin  07/01/2012  . Malignant neoplasm of prostate ([place]) 07/01/2012  . Microscopic hematuria 07/01/2012  . Cervical stenosis of spinal canal 01/10/2012    Jonathan Harvey, [place] MPT 02/08/2020, 1:11 PM  [facility] [address] [place], [place], [address] Phone: [phone]   Fax:  [phone]  Name: Jonathan Harvey SAVITZ MRN: [id number] Date of Birth: [birth year]

## 2020-02-14 ENCOUNTER — Other Ambulatory Visit: Payer: Self-pay

## 2020-02-14 ENCOUNTER — Ambulatory Visit: Payer: Medicare PPO | Admitting: Physical Therapy

## 2020-02-14 DIAGNOSIS — M542 Cervicalgia: Secondary | ICD-10-CM

## 2020-02-14 DIAGNOSIS — R293 Abnormal posture: Secondary | ICD-10-CM

## 2020-02-14 NOTE — Therapy (Signed)
[facility] [place], [place], [address] Phone: [phone]   Fax:  [phone]  Physical Therapy Treatment  Patient Details  Name: Jonathan Harvey MRN: [id number] Date of Birth: [birth year] Referring Provider (PT): Gara Kroner MD   Encounter Date: 02/14/2020   PT End of Session - 02/14/20 1154    Visit Number 9    Number of Visits 12    Date for PT Re-Evaluation 02/28/20    Authorization Type FOTO.    PT Start Time 1117           Past Medical History:  Diagnosis Date  . Arthritis    "all over" (12/25/2016)  . Chronic lower back pain    "severe" (12/25/2016)  . Hepatitis B 2008  . History of kidney stones   . Hypertension   . Hypertriglyceridemia   . Prostate cancer ([place]) 10/2006   s/p robotic prostatectomy   . Renal cell carcinoma, right ([place])   . Spinal stenosis     Past Surgical History:  Procedure Laterality Date  . CARPAL TUNNEL RELEASE Right 2010  . CYST EXCISION Left    elbow  . FEMUR SURGERY Left ~ 1962   "femur was growing crooked; broke it to straighten it out; body cast for ~ 4 months"  . FINGER SURGERY Right 1987   right thumb and index finger (pt denies this hx on 12/25/2016)  . FRACTURE SURGERY    . INGUINAL HERNIA REPAIR Left 2008  . JOINT REPLACEMENT    . MASTOIDECTOMY  1982  . ORIF GREATER TROCHANTERIC FRACTURE Left 12/25/2016    ACETABULAR REVISION/notes 12/25/2016  . PROSTATECTOMY  10/2006  . ROBOTIC ASSITED PARTIAL NEPHRECTOMY Right 01/30/2017   Procedure: ROBOTIC ASSITED PARTIAL NEPHRECTOMY;  Surgeon: Ardis Hughs, MD;  [facility];  Service: Urology;  Laterality: Right;  . TOTAL HIP ARTHROPLASTY Left 2002  . TOTAL HIP REVISION Left 04/24/2016   Procedure: TOTAL HIP REVISION;  Surgeon: [name], MD;  Location: [place];  Service: Orthopedics;  Laterality: Left;  . TOTAL HIP REVISION Left 12/25/2016   Procedure: OPEN REDUCTION INTERNAL FIXATION GREATER TROCHANTERIC  FRACTURE, ACETABULAR REVISION;  Surgeon: [name], MD;  Location: [place];  Service: Orthopedics;  Laterality: Left;  . TOTAL KNEE ARTHROPLASTY Right 08/26/2016   Procedure: RIGHT TOTAL KNEE ARTHROPLASTY;  Surgeon: [name], MD;  Location: [place];  Service: Orthopedics;  Laterality: Right;    There were no vitals filed for this visit.   Subjective Assessment - 02/14/20 1119    Subjective COVID-19 screen performed prior to patient entering clinic.  About the same.  Right side hurts more than left.    Currently in Pain? Yes    Pain Score --   3 on left and 5 on right.                            [place] Adult PT Treatment/Exercise - 02/14/20 0001      Exercises   Exercises Shoulder      Shoulder Exercises: Seated   Other Seated Exercises UBE at 90 RPM's x 10 minutes.    Other Seated Exercises Pulleys x 3 minutes.      Modalities   Modalities Electrical Stimulation;Moist Heat      Moist Heat Therapy   Number Minutes Moist Heat 20 Minutes    Moist Heat Location Cervical      Electrical Stimulation   Electrical Stimulation Location  Right cervical.    Electrical Stimulation Action IFC at 80-150 Hz on 40% scan x 20 minutes.    Electrical Stimulation Goals Tone;Pain      Manual Therapy   Manual Therapy Soft tissue mobilization    Soft tissue mobilization STW/M x 10 minutes to patient's right cervical musculature to reduce pain and tone.                       PT Long Term Goals - 12/06/19 1258      PT LONG TERM GOAL #1   Title Patient will be independent with HEP    Time 8    Period Weeks    Status New      PT LONG TERM GOAL #2   Title Patient able to perform ADL's wiht pain not > 2-3/10.    Time 8    Period Weeks    Status New                 Plan - 02/14/20 1157    Clinical Impression Statement Patient has done well with therapy and is highly motivated.  STW/M and e'stim focused on right side today with is is CC of pain.     Personal Factors and Comorbidities Comorbidity 1;Comorbidity 2    Comorbidities controlled HTN, chronic lower back pain, right RTC injury, right TKA, left THA, femural surgery.    Examination-Activity Limitations Other    Examination-Participation Restrictions Other    Stability/Clinical Decision Making Stable/Uncomplicated           Patient will benefit from skilled therapeutic intervention in order to improve the following deficits and impairments:  Pain,Decreased activity tolerance,Decreased range of motion,Increased muscle spasms,Postural dysfunction  Visit Diagnosis: Cervicalgia - Plan: PT plan of care cert/re-cert  Abnormal posture - Plan: PT plan of care cert/re-cert     Problem List Patient Active Problem List   Diagnosis Date Noted  . Renal cell carcinoma of right kidney ([place]) 02/01/2017  . Postoperative anemia due to acute blood loss 02/01/2017  . Renal mass 01/30/2017  . 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 Primary osteoarthritis of left hip 12/25/2016  . Primary localized osteoarthritis of right knee 08/26/2016  . Primary osteoarthritis of right knee 08/25/2016  . Status post revision of total hip 04/24/2016  . ED (erectile dysfunction) of organic origin 07/01/2012  . Malignant neoplasm of prostate ([place]) 07/01/2012  . Microscopic hematuria 07/01/2012  . Cervical stenosis of spinal canal 01/10/2012   PHYSICAL THERAPY DISCHARGE SUMMARY  Visits from Start of Care: 9.  Current functional level related to goals / functional outcomes: See above.   Remaining deficits: See goal section.   Education / Equipment: HEP. Plan: Patient agrees to discharge.  Patient goals were partially met. Patient is being discharged due to being pleased with the current functional level.  ?????      Iren Whipp, [place] MPT 02/14/2020, 12:00 PM  [facility] [place], [place], [address] Phone: [phone]    Fax:  [phone]  Name: Jonathan Harvey MRN: [id number] Date of Birth: [birth year]

## 2021-02-06 ENCOUNTER — Ambulatory Visit (HOSPITAL_COMMUNITY)
Admission: RE | Admit: 2021-02-06 | Discharge: 2021-02-06 | Disposition: A | Discharge status: Home / Self Care | Payer: Medicare PPO | Source: Ambulatory Visit | Attending: Urology | Admitting: Urology

## 2021-02-06 ENCOUNTER — Other Ambulatory Visit: Payer: Self-pay

## 2021-02-06 ENCOUNTER — Other Ambulatory Visit (HOSPITAL_COMMUNITY): Payer: Self-pay | Admitting: Urology

## 2021-02-06 DIAGNOSIS — C641 Malignant neoplasm of right kidney, except renal pelvis: Secondary | ICD-10-CM

## 2021-03-02 IMAGING — MR MR SHOULDER*R* W/O CM
4 of 5 series · 21 of 40 positions shown · non-contrast
Comparison: None.

CLINICAL DATA: Right shoulder pain for 3-4 weeks. No specific
injury.

EXAM:
MRI OF THE RIGHT SHOULDER WITHOUT CONTRAST
TECHNIQUE: Multiplanar, multisequence MR imaging of the shoulder was performed.
No intravenous contrast was administered.

[Series 6: PD fat-sat · axial · right · 4.0mm · 0.44mm/px · z∈[-0,+87]mm · 8 of 20 slices shown (1 of 2)]
[im 1/20]
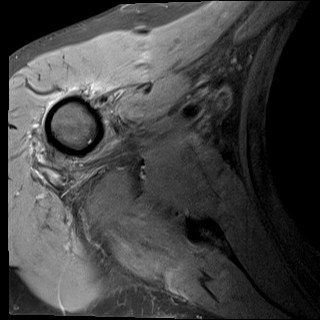
[im 3/20]
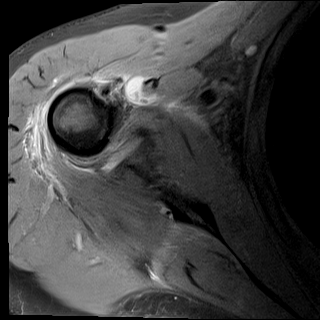
[im 6/20]
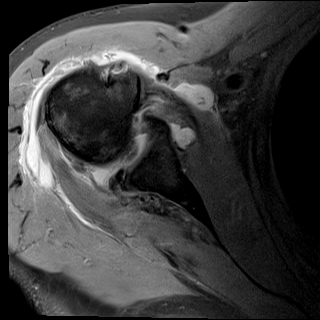
[im 9/20]
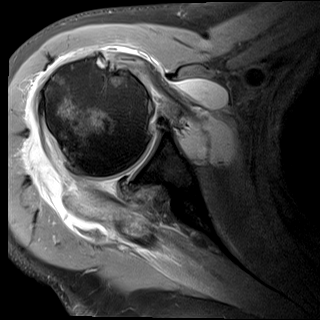
[im 11/20]
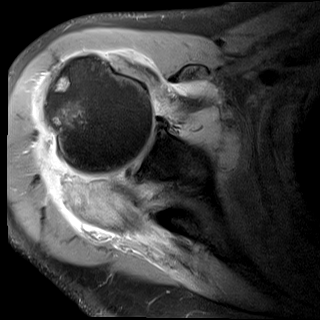
[im 14/20]
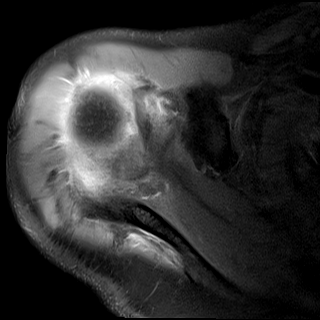
[im 17/20]
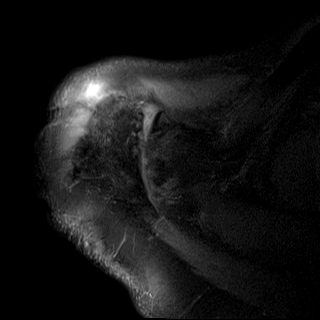
[im 20/20]
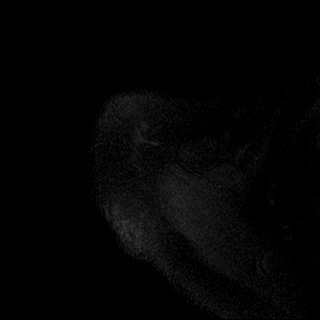

[Series 7: T2 fat-sat · oblique · right · 4.0mm · 0.44mm/px · 3 of 26 slices shown (1 of 2)]
[im 4/26]
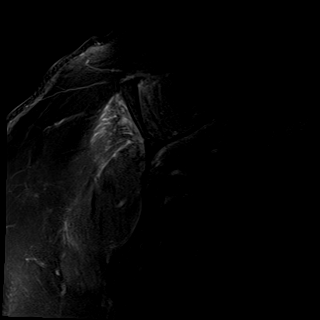
[im 13/26]
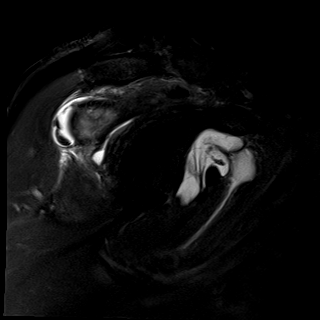
[im 22/26]
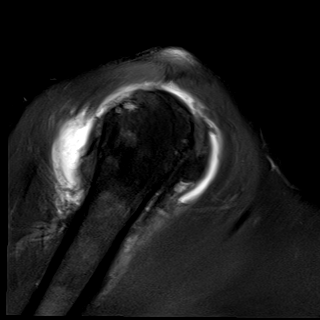

[Series 9: T2 fat-sat · oblique · right · 4.0mm · 0.22mm/px · 3 of 21 slices shown (2 of 2)]
[im 4/21]
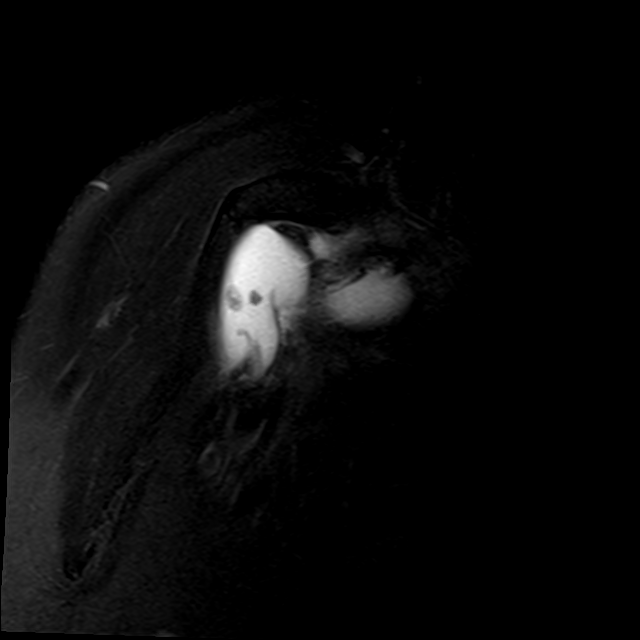
[im 11/21]
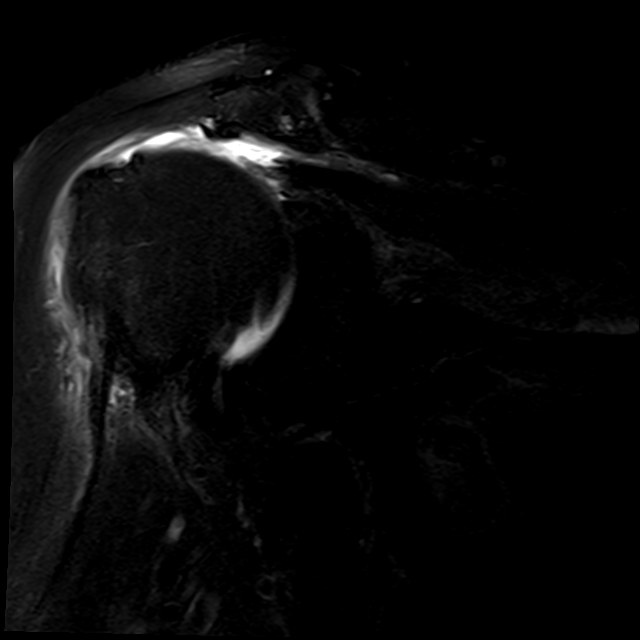
[im 17/21]
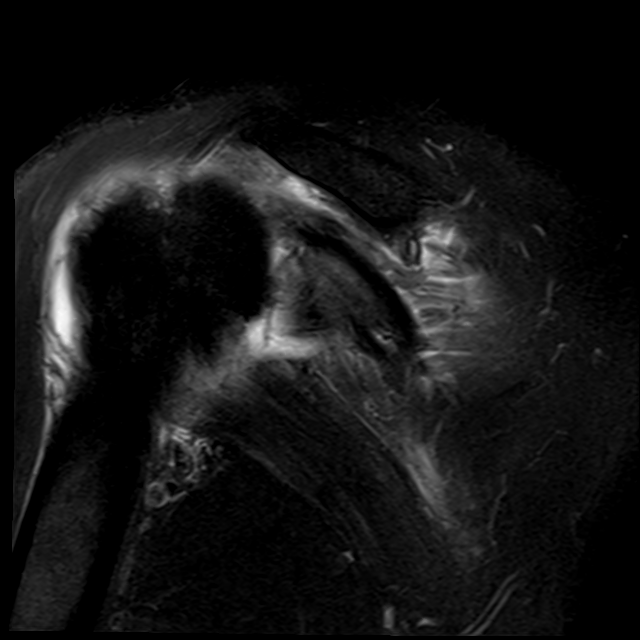

[Series 11: PD fat-sat · oblique · right · 4.0mm · 0.22mm/px · 7 of 21 slices shown (2 of 2)]
[im 1/21]
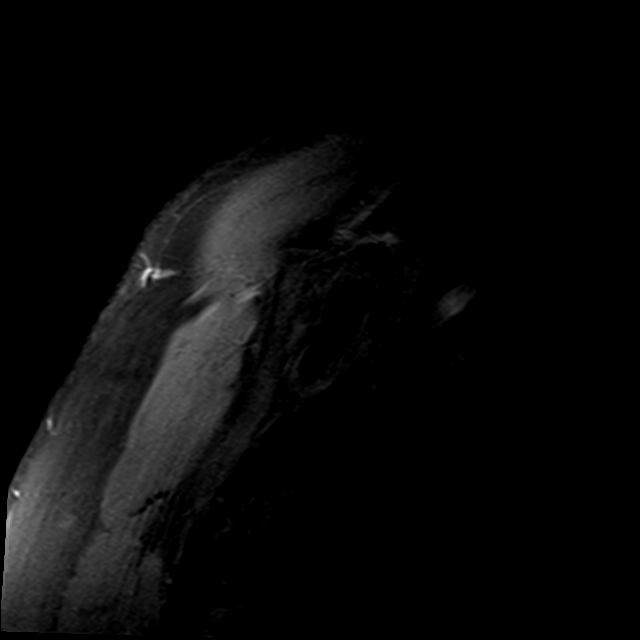
[im 4/21]
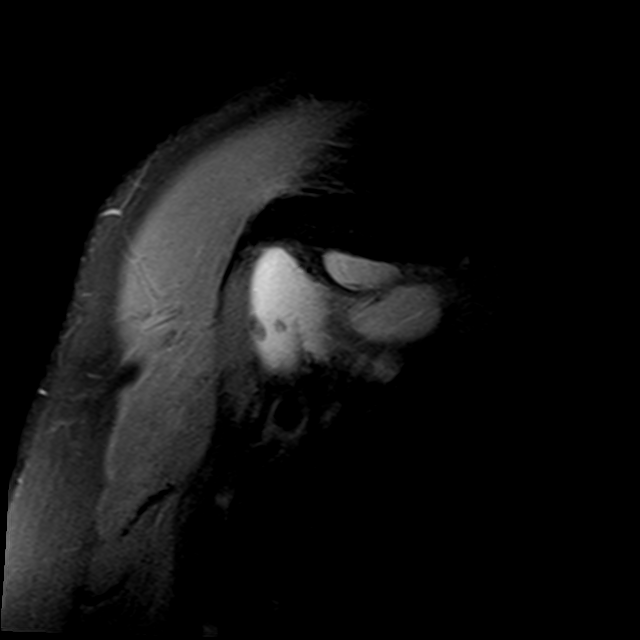
[im 7/21]
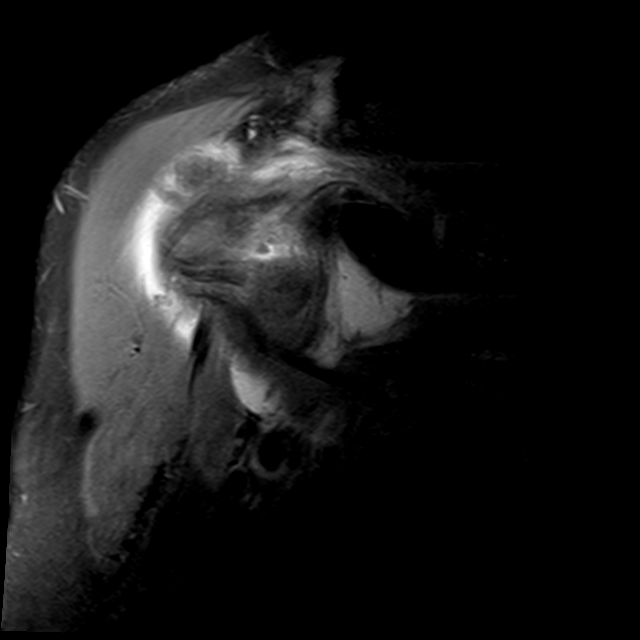
[im 11/21]
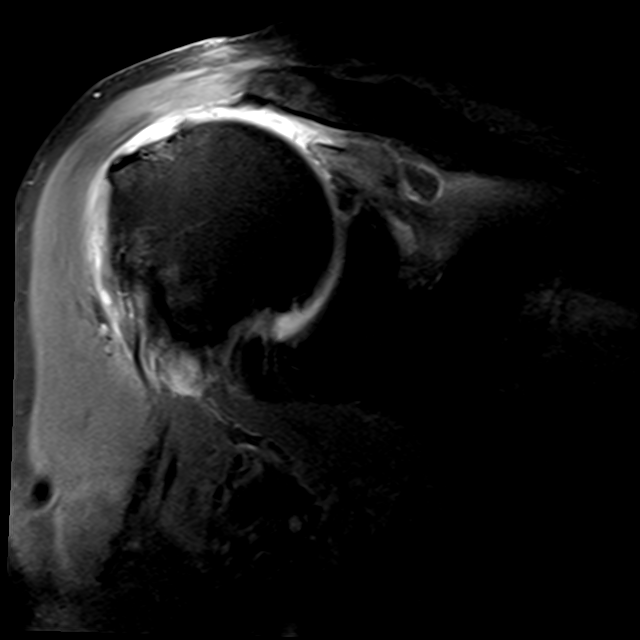
[im 14/21]
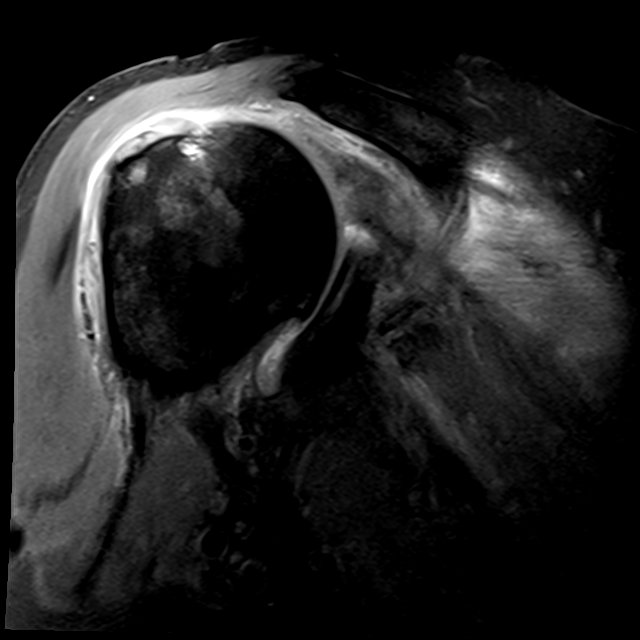
[im 17/21]
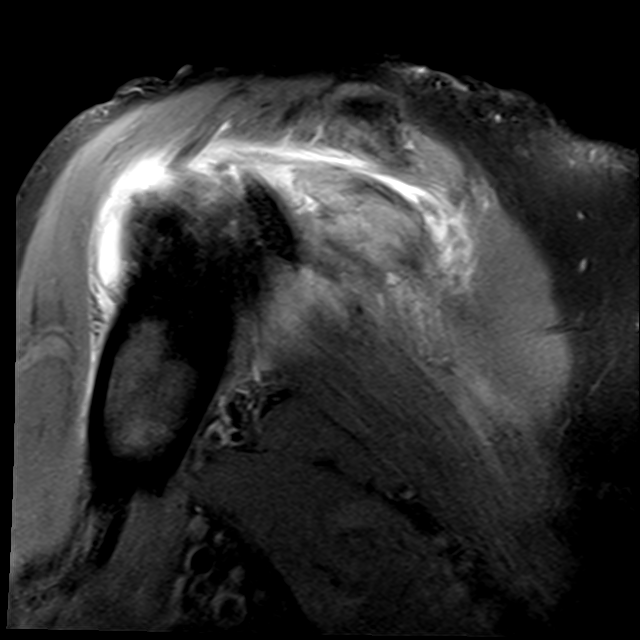
[im 21/21]
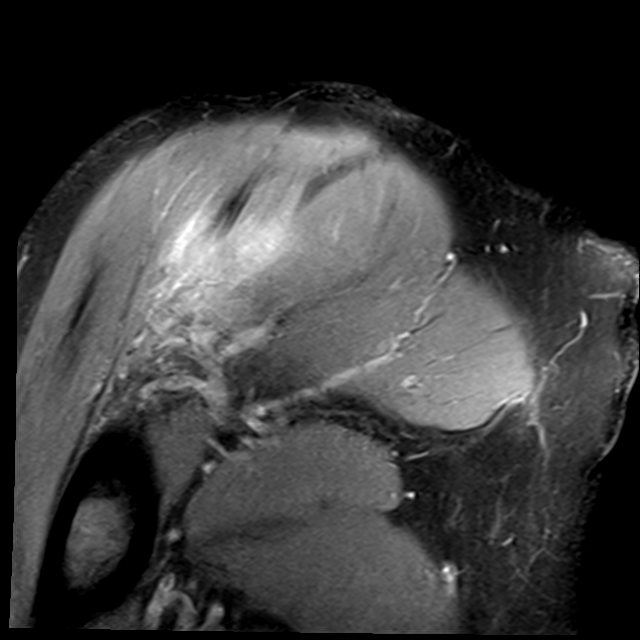

[21 of 40 positions shown; findings below may reference images not displayed]

FINDINGS: Rotator cuff: Large full-thickness retracted rotator cuff tear. The
supraspinatus tendon is retracted a maximum of 3.7 cm and the
infraspinatus tendon is retracted approximately 2.6 cm. Some of the
very most anterior fibers of the supraspinatus tendon appear to be
intact and the subscapularis tendon is intact. There is fairly
significant tendinopathy involving the subscapularis.

Muscles: Extensive tearing of the infraspinatus muscle with edema,
fluid and hemorrhage in and around the muscle.

Biceps long head:  Torn and retracted.

Acromioclavicular Joint: Moderate degenerative changes. Type 2
acromion. No lateral downsloping or undersurface spurring.

Glenohumeral Joint: Moderate degenerative changes with joint
effusion and synovitis. Small loose bodies are also noted in the
joint. Similar findings in the subcoracoid bursa.

Labrum:  Degenerated and torn anterior and posterior labrum.

Bones:  No acute bony findings.

Other: Expected fluid in the subacromial/subdeltoid bursa.
IMPRESSION: 1. Large full-thickness retracted rotator cuff tear as detailed
above. The subscapularis tendon is still intact but demonstrates
significant tendinopathy.
2. Extensive tearing of the infraspinatus muscle.
3. Torn and retracted long head biceps tendon. The anterior and
posterior labrum are also torn.
4. Glenohumeral degenerative changes, joint effusion, synovitis and
loose bodies.

## 2022-02-04 ENCOUNTER — Ambulatory Visit (HOSPITAL_COMMUNITY)
Admission: RE | Admit: 2022-02-04 | Discharge: 2022-02-04 | Disposition: A | Discharge status: Home / Self Care | Payer: Medicare PPO | Source: Ambulatory Visit | Attending: Urology | Admitting: Urology

## 2022-02-04 ENCOUNTER — Other Ambulatory Visit (HOSPITAL_COMMUNITY): Payer: Self-pay | Admitting: Urology

## 2022-02-04 DIAGNOSIS — C641 Malignant neoplasm of right kidney, except renal pelvis: Secondary | ICD-10-CM | POA: Insufficient documentation

## 2022-07-22 ENCOUNTER — Other Ambulatory Visit: Payer: Self-pay | Admitting: Urology

## 2022-07-22 DIAGNOSIS — C641 Malignant neoplasm of right kidney, except renal pelvis: Secondary | ICD-10-CM

## 2022-08-08 ENCOUNTER — Ambulatory Visit
Admission: RE | Admit: 2022-08-08 | Discharge: 2022-08-08 | Disposition: A | Discharge status: Home / Self Care | Payer: Medicare PPO | Source: Ambulatory Visit | Attending: Urology | Admitting: Urology

## 2022-08-08 DIAGNOSIS — C641 Malignant neoplasm of right kidney, except renal pelvis: Secondary | ICD-10-CM

## 2022-08-08 MED ORDER — GADOPICLENOL 0.5 MMOL/ML IV SOLN
8.0000 mL | Freq: Once | INTRAVENOUS | Status: AC | PRN
  Administered 2022-08-08: 8 mL via INTRAVENOUS

## 2023-02-14 ENCOUNTER — Ambulatory Visit (HOSPITAL_COMMUNITY)
Admission: RE | Admit: 2023-02-14 | Discharge: 2023-02-14 | Disposition: A | Discharge status: Home / Self Care | Payer: Medicare PPO | Source: Ambulatory Visit | Attending: Urology | Admitting: Urology

## 2023-02-14 ENCOUNTER — Other Ambulatory Visit (HOSPITAL_COMMUNITY): Payer: Self-pay | Admitting: Urology

## 2023-02-14 DIAGNOSIS — C641 Malignant neoplasm of right kidney, except renal pelvis: Secondary | ICD-10-CM

## 2023-12-08 ENCOUNTER — Encounter: Payer: Self-pay | Admitting: Physical Therapy

## 2023-12-08 ENCOUNTER — Other Ambulatory Visit: Payer: Self-pay

## 2023-12-08 ENCOUNTER — Ambulatory Visit: Attending: Neurological Surgery | Admitting: Physical Therapy

## 2023-12-08 DIAGNOSIS — M5459 Other low back pain: Secondary | ICD-10-CM | POA: Insufficient documentation

## 2023-12-08 DIAGNOSIS — M62830 Muscle spasm of back: Secondary | ICD-10-CM | POA: Diagnosis present

## 2023-12-08 DIAGNOSIS — R293 Abnormal posture: Secondary | ICD-10-CM | POA: Insufficient documentation

## 2023-12-08 NOTE — Therapy (Addendum)
 OUTPATIENT PHYSICAL THERAPY THORACOLUMBAR EVALUATION   Patient Name: Jonathan Harvey MRN: [id number] DOB:13-Jun-1945, 78 y.o., male Today's Date: 12/08/2023  END OF SESSION:  PT End of Session - 12/08/23 1227     Visit Number 1    Number of Visits 12    Date for Recertification  01/12/24    PT Start Time 1027    PT Stop Time 1117    PT Time Calculation (min) 50 min    Activity Tolerance Patient tolerated treatment well    Behavior During Therapy [facility] for tasks assessed/performed          Past Medical History:  Diagnosis Date   Arthritis    all over (12/25/2016)   Chronic lower back pain    severe (12/25/2016)   Hepatitis B 2008   History of kidney stones    Hypertension    Hypertriglyceridemia    Prostate cancer (HCC) 10/2006   s/p robotic prostatectomy    Renal cell carcinoma, right (HCC)    Spinal stenosis    Past Surgical History:  Procedure Laterality Date   CARPAL TUNNEL RELEASE Right 2010   CYST EXCISION Left    elbow   FEMUR SURGERY Left ~ 1962   femur was growing crooked; broke it to straighten it out; body cast for ~ 4 months   FINGER SURGERY Right 1987   right thumb and index finger (pt denies this hx on 12/25/2016)   FRACTURE SURGERY     INGUINAL HERNIA REPAIR Left 2008   JOINT REPLACEMENT     MASTOIDECTOMY  1982   ORIF GREATER TROCHANTERIC FRACTURE Left 12/25/2016    ACETABULAR REVISION/notes 12/25/2016   PROSTATECTOMY  10/2006   ROBOTIC ASSITED PARTIAL NEPHRECTOMY Right 01/30/2017   Procedure: ROBOTIC ASSITED PARTIAL NEPHRECTOMY;  Surgeon: Cam Morene ORN, MD;  Location: [facility];  Service: Urology;  Laterality: Right;   TOTAL HIP ARTHROPLASTY Left 2002   TOTAL HIP REVISION Left 04/24/2016   Procedure: TOTAL HIP REVISION;  Surgeon: Dempsey Sensor, MD;  Location: [facility];  Service: Orthopedics;  Laterality: Left;   TOTAL HIP REVISION Left 12/25/2016   Procedure: OPEN REDUCTION INTERNAL FIXATION GREATER TROCHANTERIC FRACTURE, ACETABULAR REVISION;   Surgeon: Sensor Dempsey, MD;  Location: [facility];  Service: Orthopedics;  Laterality: Left;   TOTAL KNEE ARTHROPLASTY Right 08/26/2016   Procedure: RIGHT TOTAL KNEE ARTHROPLASTY;  Surgeon: Sensor Dempsey, MD;  Location: [facility];  Service: Orthopedics;  Laterality: Right;   Patient Active Problem List   Diagnosis Date Noted   Renal cell carcinoma of right kidney (HCC) 02/01/2017   Postoperative anemia due to acute blood loss 02/01/2017   Renal mass 01/30/2017   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    Rationale for Evaluation and Treatment: Rehabilitation  THERAPY DIAG:  Other low back pain  Abnormal posture  Muscle spasm of back  ONSET DATE: Ongoing.    SUBJECTIVE:  SUBJECTIVE STATEMENT: The patient presents to the clinic with c/o chronic low back pain.  His CC of pain today is pain in his low back and radiation into his left hip region.  His pain-level today is a 6/10 and can rise to higher levels with increased walking and activities.  He describes his pain as a sharp and throbbing.  Medication and rest help decrease his pain.  He states when he walks for longer periods of time he uses a cane.  He gad a recent ESI which helped some.   PERTINENT HISTORY:  Left THA, right TKA, neck surgery.  PAIN:  Are you having pain? Yes:  NPRS scale: 6/10. Pain location: Low back.   Pain description: As above.   Aggravating factors: As above.   Relieving factors: As above.    PRECAUTIONS: None  RED FLAGS: None   WEIGHT BEARING RESTRICTIONS: No  FALLS:  Has patient fallen in last 6 months? No  LIVING ENVIRONMENT: Lives with: lives with their spouse Lives in: House/apartment Has following equipment at home: None  OCCUPATION: Retired.    PLOF: Independent with basic ADLs  PATIENT GOALS: Reduce pain and try to improve posture some.     OBJECTIVE:  Note: Objective measures were completed at Evaluation unless otherwise noted.  DIAGNOSTIC FINDINGS:  MPRESSION: 1. Advanced chronic disc and endplate degeneration throughout the lumbar spine, stable since 2020 except at L1-L2 (see #2). No acute osseous abnormality.   2. Progressed multifactorial moderate to severe spinal and left lateral recess stenosis at L1-L2, but stable moderate L1 foraminal stenosis greater on the left.   3. Moderate to severe chronic spinal and left lateral recess stenosis L2-L3 through L5-S1. And moderate to severe neural foraminal stenosis also at the left L3, L4, and L5 nerve levels.   4. Mild degenerative spinal stenosis at the level of the conus medullaris, T12-L1. But no conus mass effect or signal abnormality.  PATIENT SURVEYS:  ODI:  13/50.   POSTURE: rounded shoulders, forward head, decreased lumbar lordosis, and flexed trunk .  He stands in ~15 degrees of trunk flexion.  Left iliac crest significantly higher than right.  Right trunk lean, right genu valgum.  He has an additional shoe lift to compensates for a very significant leg length discrepancy.    PALPATION: His lumbar musculature is very taut per palpation.    LUMBAR ROM:   WFL active lumbar flexion and extension to 0 degrees.   LOWER EXTREMITY MMT:   Left hip flexion is 4 to 4+/5.  Bilateral knee strength is normal.  Ankle strength is 4 to 4+/5.     LUMBAR  SPECIAL TESTS:  Negative SLR test.    FUNCTIONAL TESTS:  5 times sit to stand: 11 seconds. Timed up and go (TUG): 9 seconds.    GAIT: The patient walks with the aforementioned postural abnormalities.    TREATMENT DATE: 12/08/23:   HMP and IFC at 80-150 Hz on 40% scan x 20 minutes to patient's lumbar region.    Normal modality resposne following removal of modality.  PATIENT EDUCATION:  Education details:  Person educated:  International aid/development worker:  Education comprehension:   HOME EXERCISE PROGRAM:   ASSESSMENT:  CLINICAL IMPRESSION: The patient presents to [facility] with chronic low back pain and radiation into his left hip.  His lumbar musculature is very taut to palpation diffusely.  He has multiple postural abnormalities.  His 5 time sit to stand test is 11 seconds and his TUG is 9 seconds.  His pain prohibits him from walking long distances.  His ODI score is 13/50.  Normal modality resposne following removal of modality.  OBJECTIVE IMPAIRMENTS: Abnormal gait, decreased activity tolerance, difficulty walking, decreased ROM, decreased strength, increased muscle spasms, postural dysfunction, and pain.   ACTIVITY LIMITATIONS: carrying, lifting, bending, standing, and locomotion level  PARTICIPATION LIMITATIONS: meal prep, cleaning, laundry, and community activity  PERSONAL FACTORS: Time since onset of injury/illness/exacerbation and 1-2 comorbidities: left THA, right TKA are also affecting patient's functional outcome.   REHAB POTENTIAL: Good(-).  CLINICAL DECISION MAKING: Evolving/moderate complexity  EVALUATION COMPLEXITY: Moderate   GOALS:  SHORT TERM GOALS: Target date: 12/22/23  Ind with an initial HEP. Goal status: INITIAL  LONG TERM GOALS: Target date: 01/19/24  Ind with an advanced HEP.  Goal status: INITIAL  2.  Improve ODI score by at least 4  points.  Goal status: INITIAL  3.  Walk a community distance with pain not > 4/10.  Goal status: INITIAL  4.  Perform ADL's with pain not > 4/10.  Goal status: INITIAL  PLAN:  PT FREQUENCY: 2x/week  PT DURATION: 6 weeks  PLANNED INTERVENTIONS: 97110-Therapeutic exercises, 97530- Therapeutic activity, [id number]- Neuromuscular re-education, 97535- Self Care, [id number]- Manual therapy, [id number]- Electrical stimulation (unattended), 97035- Ultrasound, Patient/Family education, Cryotherapy, and Moist heat.  PLAN FOR NEXT SESSION: Postural exercises, core exercise progression, STW/M.  Modalities as needed.     Judine Arciniega, [place], PT 12/08/2023, 12:56 PM

## 2023-12-10 ENCOUNTER — Other Ambulatory Visit: Payer: Self-pay | Admitting: Medical Genetics

## 2023-12-15 ENCOUNTER — Ambulatory Visit: Admitting: Physical Therapy

## 2023-12-15 DIAGNOSIS — M5459 Other low back pain: Secondary | ICD-10-CM | POA: Diagnosis not present

## 2023-12-15 DIAGNOSIS — M62830 Muscle spasm of back: Secondary | ICD-10-CM

## 2023-12-15 DIAGNOSIS — R293 Abnormal posture: Secondary | ICD-10-CM

## 2023-12-15 NOTE — Therapy (Signed)
 OUTPATIENT PHYSICAL THERAPY THORACOLUMBAR TREATMENT  Patient Name: Jonathan Harvey MRN: [id number] DOB:03/13/1945, 78 y.o., male Today's Date: 12/15/2023  END OF SESSION:  PT End of Session - 12/15/23 1206     Visit Number 2    Number of Visits 12    Date for Recertification  01/12/24    PT Start Time 1015    PT Stop Time 1111    PT Time Calculation (min) 56 min    Activity Tolerance Patient tolerated treatment well    Behavior During Therapy [facility] for tasks assessed/performed          Past Medical History:  Diagnosis Date   Arthritis    all over (12/25/2016)   Chronic lower back pain    severe (12/25/2016)   Hepatitis B 2008   History of kidney stones    Hypertension    Hypertriglyceridemia    Prostate cancer (HCC) 10/2006   s/p robotic prostatectomy    Renal cell carcinoma, right (HCC)    Spinal stenosis    Past Surgical History:  Procedure Laterality Date   CARPAL TUNNEL RELEASE Right 2010   CYST EXCISION Left    elbow   FEMUR SURGERY Left ~ 1962   femur was growing crooked; broke it to straighten it out; body cast for ~ 4 months   FINGER SURGERY Right 1987   right thumb and index finger (pt denies this hx on 12/25/2016)   FRACTURE SURGERY     INGUINAL HERNIA REPAIR Left 2008   JOINT REPLACEMENT     MASTOIDECTOMY  1982   ORIF GREATER TROCHANTERIC FRACTURE Left 12/25/2016    ACETABULAR REVISION/notes 12/25/2016   PROSTATECTOMY  10/2006   ROBOTIC ASSITED PARTIAL NEPHRECTOMY Right 01/30/2017   Procedure: ROBOTIC ASSITED PARTIAL NEPHRECTOMY;  Surgeon: Cam Morene ORN, MD;  Location: [facility];  Service: Urology;  Laterality: Right;   TOTAL HIP ARTHROPLASTY Left 2002   TOTAL HIP REVISION Left 04/24/2016   Procedure: TOTAL HIP REVISION;  Surgeon: Dempsey Sensor, MD;  Location: [facility];  Service: Orthopedics;  Laterality: Left;   TOTAL HIP REVISION Left 12/25/2016   Procedure: OPEN REDUCTION INTERNAL FIXATION GREATER TROCHANTERIC FRACTURE, ACETABULAR REVISION;   Surgeon: Sensor Dempsey, MD;  Location: [facility];  Service: Orthopedics;  Laterality: Left;   TOTAL KNEE ARTHROPLASTY Right 08/26/2016   Procedure: RIGHT TOTAL KNEE ARTHROPLASTY;  Surgeon: Sensor Dempsey, MD;  Location: [facility];  Service: Orthopedics;  Laterality: Right;   Patient Active Problem List   Diagnosis Date Noted   Renal cell carcinoma of right kidney (HCC) 02/01/2017   Postoperative anemia due to acute blood loss 02/01/2017   Renal mass 01/30/2017   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    Rationale for Evaluation and Treatment: Rehabilitation  THERAPY DIAG:  Other low back pain  Abnormal posture  Muscle spasm of back  ONSET DATE: Ongoing.    SUBJECTIVE:  SUBJECTIVE STATEMENT: No new complaints.    PERTINENT HISTORY:  Left THA, right TKA, neck surgery.  PAIN:  Are you having pain? Yes: NPRS scale: 6/10. Pain location: Low back.   Pain description: As above.   Aggravating factors: As above.   Relieving factors: As above.    PRECAUTIONS: None  RED FLAGS: None   WEIGHT BEARING RESTRICTIONS: No  FALLS:  Has patient fallen in last 6 months? No  LIVING ENVIRONMENT: Lives with: lives with their spouse Lives in: House/apartment Has following equipment at home: None  OCCUPATION: Retired.    PLOF:  Independent with basic ADLs  PATIENT GOALS: Reduce pain and try to improve posture some.     OBJECTIVE:  Note: Objective measures were completed at Evaluation unless otherwise noted.  DIAGNOSTIC FINDINGS:  MPRESSION: 1. Advanced chronic disc and endplate degeneration throughout the lumbar spine, stable since 2020 except at L1-L2 (see #2). No acute osseous abnormality.   2. Progressed multifactorial moderate to severe spinal and left lateral recess stenosis at L1-L2, but stable moderate L1 foraminal stenosis greater on the left.   3. Moderate to severe chronic spinal and left lateral recess stenosis L2-L3 through L5-S1. And moderate to severe neural foraminal stenosis also at the left L3, L4, and L5 nerve levels.   4. Mild degenerative spinal stenosis at the level of the conus medullaris, T12-L1. But no conus mass effect or signal abnormality.  PATIENT SURVEYS:  ODI:  13/50.   POSTURE: rounded shoulders, forward head, decreased lumbar lordosis, and flexed trunk .  He stands in ~15 degrees of trunk flexion.  Left iliac crest significantly higher than right.  Right trunk lean, right genu valgum.  He has an additional shoe lift to compensates for a very significant leg length discrepancy.    PALPATION: His lumbar musculature is very taut per palpation.    LUMBAR ROM:   WFL active lumbar flexion and extension to 0 degrees.   LOWER EXTREMITY MMT:   Left hip flexion is 4 to 4+/5.  Bilateral knee strength is normal.  Ankle strength is 4 to 4+/5.     LUMBAR SPECIAL TESTS:  Negative SLR test.    FUNCTIONAL TESTS:  5 times sit to stand: 11 seconds. Timed up and go (TUG): 9 seconds.    GAIT: The patient walks with the aforementioned postural abnormalities.    TREATMENT DATE: 12/15/23;  Nustep level 3 x 15 minutes f/b patient in left sdly position with pillow between knees for comfort while receiving STW/M x 10 minutes to bilateral lumbar musculature f/b HMP and IFC at 80-150 Hz  on 40% scan x 20 minutes to patient's lumbar region.    Normal modality resposne following removal of modality.                                                                                                                            PATIENT EDUCATION:  Education details:  Person educated:  Education method:  Education comprehension:   HOME EXERCISE PROGRAM:   ASSESSMENT:  CLINICAL IMPRESSION: Patient's lumbar musculature is very taut to palpation.  He tolerated STW/M as needed.  OBJECTIVE IMPAIRMENTS: Abnormal gait, decreased activity tolerance, difficulty walking, decreased ROM, decreased strength, increased muscle spasms, postural dysfunction, and pain.   ACTIVITY LIMITATIONS: carrying, lifting, bending, standing, and locomotion level  PARTICIPATION LIMITATIONS: meal prep, cleaning, laundry, and community activity  PERSONAL FACTORS: Time since onset of injury/illness/exacerbation and 1-2 comorbidities: left THA, right TKA are also affecting patient's functional outcome.   REHAB POTENTIAL: Good(-).  CLINICAL DECISION MAKING: Evolving/moderate complexity  EVALUATION COMPLEXITY: Moderate   GOALS:  SHORT TERM GOALS: Target date: 12/22/23  Ind with an initial HEP. Goal status: INITIAL  LONG TERM GOALS: Target date: 01/19/24  Ind with an advanced HEP.  Goal status: INITIAL  2.  Improve ODI score by at least 4 points.  Goal status: INITIAL  3.  Walk a community distance with pain not > 4/10.  Goal status: INITIAL  4.  Perform ADL's with pain not > 4/10.  Goal status: INITIAL  PLAN:  PT FREQUENCY: 2x/week  PT DURATION: 6 weeks  PLANNED INTERVENTIONS: 97110-Therapeutic exercises, 97530- Therapeutic activity, [id number]- Neuromuscular re-education, 97535- Self Care, [id number]- Manual therapy, [id number]- Electrical stimulation (unattended), 97035- Ultrasound, Patient/Family education, Cryotherapy, and Moist heat.  PLAN FOR NEXT SESSION: Postural exercises, core exercise  progression, STW/M.  Modalities as needed.     Liviah Cake, [place], PT 12/15/2023, 12:10 PM

## 2023-12-22 ENCOUNTER — Ambulatory Visit: Admitting: Physical Therapy

## 2023-12-22 DIAGNOSIS — M62830 Muscle spasm of back: Secondary | ICD-10-CM

## 2023-12-22 DIAGNOSIS — M5459 Other low back pain: Secondary | ICD-10-CM | POA: Diagnosis not present

## 2023-12-22 DIAGNOSIS — R293 Abnormal posture: Secondary | ICD-10-CM

## 2023-12-22 NOTE — Therapy (Signed)
 OUTPATIENT PHYSICAL THERAPY THORACOLUMBAR TREATMENT  Patient Name: Jonathan Harvey MRN: [id number] DOB:05-Sep-1945, 78 y.o., male Today's Date: 12/22/2023  END OF SESSION:  PT End of Session - 12/22/23 1126     Visit Number 3    Number of Visits 12    Date for Recertification  01/12/24    PT Start Time 1015    PT Stop Time 1127    PT Time Calculation (min) 72 min    Activity Tolerance Patient tolerated treatment well    Behavior During Therapy [facility] for tasks assessed/performed           Past Medical History:  Diagnosis Date   Arthritis    all over (12/25/2016)   Chronic lower back pain    severe (12/25/2016)   Hepatitis B 2008   History of kidney stones    Hypertension    Hypertriglyceridemia    Prostate cancer (HCC) 10/2006   s/p robotic prostatectomy    Renal cell carcinoma, right (HCC)    Spinal stenosis    Past Surgical History:  Procedure Laterality Date   CARPAL TUNNEL RELEASE Right 2010   CYST EXCISION Left    elbow   FEMUR SURGERY Left ~ 1962   femur was growing crooked; broke it to straighten it out; body cast for ~ 4 months   FINGER SURGERY Right 1987   right thumb and index finger (pt denies this hx on 12/25/2016)   FRACTURE SURGERY     INGUINAL HERNIA REPAIR Left 2008   JOINT REPLACEMENT     MASTOIDECTOMY  1982   ORIF GREATER TROCHANTERIC FRACTURE Left 12/25/2016    ACETABULAR REVISION/notes 12/25/2016   PROSTATECTOMY  10/2006   ROBOTIC ASSITED PARTIAL NEPHRECTOMY Right 01/30/2017   Procedure: ROBOTIC ASSITED PARTIAL NEPHRECTOMY;  Surgeon: Cam Morene ORN, MD;  Location: [facility];  Service: Urology;  Laterality: Right;   TOTAL HIP ARTHROPLASTY Left 2002   TOTAL HIP REVISION Left 04/24/2016   Procedure: TOTAL HIP REVISION;  Surgeon: Dempsey Sensor, MD;  Location: [facility];  Service: Orthopedics;  Laterality: Left;   TOTAL HIP REVISION Left 12/25/2016   Procedure: OPEN REDUCTION INTERNAL FIXATION GREATER TROCHANTERIC FRACTURE, ACETABULAR REVISION;   Surgeon: Sensor Dempsey, MD;  Location: [facility];  Service: Orthopedics;  Laterality: Left;   TOTAL KNEE ARTHROPLASTY Right 08/26/2016   Procedure: RIGHT TOTAL KNEE ARTHROPLASTY;  Surgeon: Sensor Dempsey, MD;  Location: [facility];  Service: Orthopedics;  Laterality: Right;   Patient Active Problem List   Diagnosis Date Noted   Renal cell carcinoma of right kidney (HCC) 02/01/2017   Postoperative anemia due to acute blood loss 02/01/2017   Renal mass 01/30/2017   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    Rationale for Evaluation and Treatment: Rehabilitation  THERAPY DIAG:  Other low back pain  Abnormal posture  Muscle spasm of back  ONSET DATE: Ongoing.    SUBJECTIVE:  SUBJECTIVE STATEMENT: Dr. Said I'll need surgery.  PERTINENT HISTORY:  Left THA, right TKA, neck surgery.  PAIN:  Are you having pain? Yes: NPRS scale: 6/10. Pain location: Low back.   Pain description: As above.   Aggravating factors: As above.   Relieving factors: As above.    PRECAUTIONS: None  RED FLAGS: None   WEIGHT BEARING RESTRICTIONS: No  FALLS:  Has patient fallen in last 6 months? No  LIVING ENVIRONMENT: Lives with: lives with their spouse Lives in: House/apartment Has following equipment at home: None  OCCUPATION: Retired.     PLOF: Independent with basic ADLs  PATIENT GOALS: Reduce pain and try to improve posture some.     OBJECTIVE:  Note: Objective measures were completed at Evaluation unless otherwise noted.  DIAGNOSTIC FINDINGS:  MPRESSION: 1. Advanced chronic disc and endplate degeneration throughout the lumbar spine, stable since 2020 except at L1-L2 (see #2). No acute osseous abnormality.   2. Progressed multifactorial moderate to severe spinal and left lateral recess stenosis at L1-L2, but stable moderate L1 foraminal stenosis greater on the left.   3. Moderate to severe chronic spinal and left lateral recess stenosis L2-L3 through L5-S1. And moderate to severe neural foraminal stenosis also at the left L3, L4, and L5 nerve levels.   4. Mild degenerative spinal stenosis at the level of the conus medullaris, T12-L1. But no conus mass effect or signal abnormality.  PATIENT SURVEYS:  ODI:  13/50.   POSTURE: rounded shoulders, forward head, decreased lumbar lordosis, and flexed trunk .  He stands in ~15 degrees of trunk flexion.  Left iliac crest significantly higher than right.  Right trunk lean, right genu valgum.  He has an additional shoe lift to compensates for a very significant leg length discrepancy.    PALPATION: His lumbar musculature is very taut per palpation.    LUMBAR ROM:   WFL active lumbar flexion and extension to 0 degrees.   LOWER EXTREMITY MMT:   Left hip flexion is 4 to 4+/5.  Bilateral knee strength is normal.  Ankle strength is 4 to 4+/5.     LUMBAR SPECIAL TESTS:  Negative SLR test.    FUNCTIONAL TESTS:  5 times sit to stand: 11 seconds. Timed up and go (TUG): 9 seconds.    GAIT: The patient walks with the aforementioned postural abnormalities.    TREATMENT DATE:   12/22/23:  Nustep level 4 x 15 minutes f/b back ext machine with ab bracing 50# x 5 minutes f/b STW/M x 18 minutes with patient in right sdly position with pillow between knees for comfort f/b  HMP and IFC at 80-150 Hz on 40% scan x 20 minutes.    12/15/23:  Nustep level 3 x 15 minutes f/b patient in left sdly position with pillow between knees for comfort while receiving STW/M x 10 minutes to bilateral lumbar musculature f/b HMP and IFC at 80-150 Hz on 40% scan x 20 minutes to patient's lumbar region.    Normal modality resposne following removal of modality.  PATIENT EDUCATION:  Education details:  Person educated:  International aid/development worker:  Education comprehension:   HOME EXERCISE PROGRAM:   ASSESSMENT:  CLINICAL IMPRESSION: Patient did very well with the addition of back and ab machine performing with very good technique and without complaint.    OBJECTIVE IMPAIRMENTS: Abnormal gait, decreased activity tolerance, difficulty walking, decreased ROM, decreased strength, increased muscle spasms, postural dysfunction, and pain.   ACTIVITY LIMITATIONS: carrying, lifting, bending, standing, and locomotion level  PARTICIPATION LIMITATIONS: meal prep, cleaning, laundry, and community activity  PERSONAL FACTORS: Time since onset of injury/illness/exacerbation and 1-2 comorbidities: left THA, right TKA are also affecting patient's functional outcome.   REHAB POTENTIAL: Good(-).  CLINICAL DECISION MAKING: Evolving/moderate complexity  EVALUATION COMPLEXITY: Moderate   GOALS:  SHORT TERM GOALS: Target date: 12/22/23  Ind with an initial HEP. Goal status: INITIAL  LONG TERM GOALS: Target date: 01/19/24  Ind with an advanced HEP.  Goal status: INITIAL  2.  Improve ODI score by at least 4 points.  Goal status: INITIAL  3.  Walk a community distance with pain not > 4/10.  Goal status: INITIAL  4.  Perform ADL's with pain not > 4/10.  Goal status: INITIAL  PLAN:  PT FREQUENCY: 2x/week  PT DURATION: 6 weeks  PLANNED INTERVENTIONS:  97110-Therapeutic exercises, 97530- Therapeutic activity, [id number]- Neuromuscular re-education, 97535- Self Care, 02859- Manual therapy, [id number]- Electrical stimulation (unattended), 97035- Ultrasound, Patient/Family education, Cryotherapy, and Moist heat.  PLAN FOR NEXT SESSION: Postural exercises, core exercise progression, STW/M.  Modalities as needed.     Rudolfo Brandow, [place], PT 12/22/2023, 11:46 AM

## 2023-12-29 ENCOUNTER — Encounter: Payer: Self-pay | Admitting: *Deleted

## 2023-12-29 ENCOUNTER — Ambulatory Visit: Admitting: *Deleted

## 2023-12-29 DIAGNOSIS — M5459 Other low back pain: Secondary | ICD-10-CM

## 2023-12-29 DIAGNOSIS — R293 Abnormal posture: Secondary | ICD-10-CM

## 2023-12-29 DIAGNOSIS — M62830 Muscle spasm of back: Secondary | ICD-10-CM

## 2023-12-29 NOTE — Therapy (Signed)
 OUTPATIENT PHYSICAL THERAPY THORACOLUMBAR TREATMENT  Patient Name: Jonathan Harvey MRN: [id number] DOB:16-Dec-1945, 78 y.o., male Today's Date: 12/29/2023  END OF SESSION:  PT End of Session - 12/29/23 1021     Visit Number 4    Number of Visits 12    Date for Recertification  01/12/24    PT Start Time 1020    PT Stop Time 1123    PT Time Calculation (min) 63 min           Past Medical History:  Diagnosis Date   Arthritis    all over (12/25/2016)   Chronic lower back pain    severe (12/25/2016)   Hepatitis B 2008   History of kidney stones    Hypertension    Hypertriglyceridemia    Prostate cancer (HCC) 10/2006   s/p robotic prostatectomy    Renal cell carcinoma, right (HCC)    Spinal stenosis    Past Surgical History:  Procedure Laterality Date   CARPAL TUNNEL RELEASE Right 2010   CYST EXCISION Left    elbow   FEMUR SURGERY Left ~ 1962   femur was growing crooked; broke it to straighten it out; body cast for ~ 4 months   FINGER SURGERY Right 1987   right thumb and index finger (pt denies this hx on 12/25/2016)   FRACTURE SURGERY     INGUINAL HERNIA REPAIR Left 2008   JOINT REPLACEMENT     MASTOIDECTOMY  1982   ORIF GREATER TROCHANTERIC FRACTURE Left 12/25/2016    ACETABULAR REVISION/notes 12/25/2016   PROSTATECTOMY  10/2006   ROBOTIC ASSITED PARTIAL NEPHRECTOMY Right 01/30/2017   Procedure: ROBOTIC ASSITED PARTIAL NEPHRECTOMY;  Surgeon: Cam Morene ORN, MD;  Location: [facility];  Service: Urology;  Laterality: Right;   TOTAL HIP ARTHROPLASTY Left 2002   TOTAL HIP REVISION Left 04/24/2016   Procedure: TOTAL HIP REVISION;  Surgeon: Dempsey Sensor, MD;  [facility]: [facility];  Service: Orthopedics;  Laterality: Left;   TOTAL HIP REVISION Left 12/25/2016   Procedure: OPEN REDUCTION INTERNAL FIXATION GREATER TROCHANTERIC FRACTURE, ACETABULAR REVISION;  Surgeon: Sensor Dempsey, MD;  Location: [facility];  Service: Orthopedics;  Laterality: Left;   TOTAL KNEE ARTHROPLASTY  Right 08/26/2016   Procedure: RIGHT TOTAL KNEE ARTHROPLASTY;  Surgeon: Sensor Dempsey, MD;  [facility];  Service: Orthopedics;  Laterality: Right;   Patient Active Problem List   Diagnosis Date Noted   Renal cell carcinoma of right kidney (HCC) 02/01/2017   Postoperative anemia due to acute blood loss 02/01/2017   Renal mass 01/30/2017   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    Rationale for Evaluation and Treatment: Rehabilitation  THERAPY DIAG:  Other low back pain  Abnormal posture  Muscle spasm of back  ONSET DATE: Ongoing.    SUBJECTIVE:  SUBJECTIVE STATEMENT: Dr. Said I'll need surgery. Doing fair today, but not any better  PERTINENT HISTORY:  Left THA, right TKA, neck surgery.  PAIN:  Are you having pain? Yes: NPRS scale: 6/10. Pain location: Low back.   Pain description: As above.   Aggravating factors: As above.   Relieving factors: As above.    PRECAUTIONS: None  RED FLAGS: None   WEIGHT BEARING RESTRICTIONS: No  FALLS:  Has patient fallen in last 6 months? No  LIVING ENVIRONMENT: Lives with: lives with their spouse Lives in: House/apartment Has following equipment at home: None  OCCUPATION: Retired.    PLOF: Independent with basic ADLs  PATIENT GOALS: Reduce pain and try to  improve posture some.     OBJECTIVE:  Note: Objective measures were completed at Evaluation unless otherwise noted.  DIAGNOSTIC FINDINGS:  MPRESSION: 1. Advanced chronic disc and endplate degeneration throughout the lumbar spine, stable since 2020 except at L1-L2 (see #2). No acute osseous abnormality.   2. Progressed multifactorial moderate to severe spinal and left lateral recess stenosis at L1-L2, but stable moderate L1 foraminal stenosis greater on the left.   3. Moderate to severe chronic spinal and left lateral recess stenosis L2-L3 through L5-S1. And moderate to severe neural foraminal stenosis also at the left L3, L4, and L5 nerve levels.   4. Mild degenerative spinal stenosis at the level of the conus medullaris, T12-L1. But no conus mass effect or signal abnormality.  PATIENT SURVEYS:  ODI:  13/50.   POSTURE: rounded shoulders, forward head, decreased lumbar lordosis, and flexed trunk .  He stands in ~15 degrees of trunk flexion.  Left iliac crest significantly higher than right.  Right trunk lean, right genu valgum.  He has an additional shoe lift to compensates for a very significant leg length discrepancy.    PALPATION: His lumbar musculature is very taut per palpation.    LUMBAR ROM:   WFL active lumbar flexion and extension to 0 degrees.   LOWER EXTREMITY MMT:   Left hip flexion is 4 to 4+/5.  Bilateral knee strength is normal.  Ankle strength is 4 to 4+/5.     LUMBAR SPECIAL TESTS:  Negative SLR test.    FUNCTIONAL TESTS:  5 times sit to stand: 11 seconds. Timed up and go (TUG): 9 seconds.    GAIT: The patient walks with the aforementioned postural abnormalities.    TREATMENT DATE:   12/29/23:  Nustep level 4 x 15 minutes f/b back ext machine with ab bracing 40# x 6 minutes Added Tband ROWS and EXT to HEP. Green tband given for HEP  STW/M x 13 minutes with patient in right sdly position with pillow between knees for comfort   HMP and IFC at 80-150  Hz on 40% scan x 15 minutes.    12/15/23:  Nustep level 3 x 15 minutes f/b patient in left sdly position with pillow between knees for comfort while receiving STW/M x 10 minutes to bilateral lumbar musculature f/b HMP and IFC at 80-150 Hz on 40% scan x 20 minutes to patient's lumbar region.    Normal modality resposne following removal of modality.  PATIENT EDUCATION:  Education details:  Person educated:  International aid/development worker:  Education comprehension:   HOME EXERCISE PROGRAM:   ASSESSMENT:  CLINICAL IMPRESSION: Patient arrived today doing about the same with LBP and hip pain. Rx focused on therex and core activation and discussion and review of AB bracing with ADL's and movement patterns to decrease pain triggers.Added rows and ext to HEP and did well  OBJECTIVE IMPAIRMENTS: Abnormal gait, decreased activity tolerance, difficulty walking, decreased ROM, decreased strength, increased muscle spasms, postural dysfunction, and pain.   ACTIVITY LIMITATIONS: carrying, lifting, bending, standing, and locomotion level  PARTICIPATION LIMITATIONS: meal prep, cleaning, laundry, and community activity  PERSONAL FACTORS: Time since onset of injury/illness/exacerbation and 1-2 comorbidities: left THA, right TKA are also affecting patient's functional outcome.   REHAB POTENTIAL: Good(-).  CLINICAL DECISION MAKING: Evolving/moderate complexity  EVALUATION COMPLEXITY: Moderate   GOALS:  SHORT TERM GOALS: Target date: 12/22/23  Ind with an initial HEP. Goal status: MET  LONG TERM GOALS: Target date: 01/19/24  Ind with an advanced HEP.  Goal status: Partially met  2.  Improve ODI score by at least 4 points.  Goal status: On going   13/50  3.  Walk a community distance with pain not > 4/10.  Goal status: NM 8/10  4.  Perform ADL's with pain not > 4/10.  Goal  status: NM    8/10  PLAN:  PT FREQUENCY: 2x/week  PT DURATION: 6 weeks  PLANNED INTERVENTIONS: 97110-Therapeutic exercises, 97530- Therapeutic activity, [id number]- Neuromuscular re-education, 97535- Self Care, 02859- Manual therapy, G0283- Electrical stimulation (unattended), 97035- Ultrasound, Patient/Family education, Cryotherapy, and Moist heat.  PLAN FOR NEXT SESSION: On hold x 2 weeks.  Postural exercises, core exercise progression, STW/M.  Modalities as needed.     Dravon Nott,[name], PTA 12/29/2023, 1:20 PM [place] [facility] [address] [place], [place], [address] Phone: [phone]   Fax:  [phone]  Patient Details  Name: KODIE KISHI MRN: [id number] Date of Birth: [birth year] Referring Provider:  Norita Miquel HERO, MD  Encounter Date: 12/29/2023   Bodhi Moradi,[name], PTA 12/29/2023, 1:20 PM  [place] [facility] [address] [place], [place], [address] Phone: [phone]   Fax:  [phone]

## 2024-01-08 ENCOUNTER — Inpatient Hospital Stay (HOSPITAL_COMMUNITY)

## 2024-01-08 ENCOUNTER — Other Ambulatory Visit: Payer: Self-pay

## 2024-01-08 ENCOUNTER — Inpatient Hospital Stay (HOSPITAL_COMMUNITY)
Admission: EM | Admit: 2024-01-08 | Discharge: 2024-01-10 | DRG: 244 | Disposition: A | Discharge status: Home / Self Care | Attending: Internal Medicine | Admitting: Internal Medicine

## 2024-01-08 DIAGNOSIS — I493 Ventricular premature depolarization: Secondary | ICD-10-CM | POA: Diagnosis not present

## 2024-01-08 DIAGNOSIS — I10 Essential (primary) hypertension: Secondary | ICD-10-CM | POA: Diagnosis not present

## 2024-01-08 DIAGNOSIS — Z87442 Personal history of urinary calculi: Secondary | ICD-10-CM

## 2024-01-08 DIAGNOSIS — I4510 Unspecified right bundle-branch block: Secondary | ICD-10-CM | POA: Diagnosis present

## 2024-01-08 DIAGNOSIS — E781 Pure hyperglyceridemia: Secondary | ICD-10-CM | POA: Diagnosis present

## 2024-01-08 DIAGNOSIS — I119 Hypertensive heart disease without heart failure: Secondary | ICD-10-CM | POA: Diagnosis present

## 2024-01-08 DIAGNOSIS — M159 Polyosteoarthritis, unspecified: Secondary | ICD-10-CM | POA: Diagnosis present

## 2024-01-08 DIAGNOSIS — Z7982 Long term (current) use of aspirin: Secondary | ICD-10-CM

## 2024-01-08 DIAGNOSIS — Z87891 Personal history of nicotine dependence: Secondary | ICD-10-CM | POA: Diagnosis not present

## 2024-01-08 DIAGNOSIS — Z79899 Other long term (current) drug therapy: Secondary | ICD-10-CM

## 2024-01-08 DIAGNOSIS — Z905 Acquired absence of kidney: Secondary | ICD-10-CM

## 2024-01-08 DIAGNOSIS — E739 Lactose intolerance, unspecified: Secondary | ICD-10-CM | POA: Diagnosis present

## 2024-01-08 DIAGNOSIS — I442 Atrioventricular block, complete: Principal | ICD-10-CM | POA: Diagnosis present

## 2024-01-08 DIAGNOSIS — Z85528 Personal history of other malignant neoplasm of kidney: Secondary | ICD-10-CM

## 2024-01-08 DIAGNOSIS — Z951 Presence of aortocoronary bypass graft: Secondary | ICD-10-CM | POA: Diagnosis not present

## 2024-01-08 DIAGNOSIS — Z96642 Presence of left artificial hip joint: Secondary | ICD-10-CM | POA: Diagnosis present

## 2024-01-08 DIAGNOSIS — Z8546 Personal history of malignant neoplasm of prostate: Secondary | ICD-10-CM | POA: Diagnosis not present

## 2024-01-08 DIAGNOSIS — G8929 Other chronic pain: Secondary | ICD-10-CM | POA: Diagnosis present

## 2024-01-08 DIAGNOSIS — R001 Bradycardia, unspecified: Secondary | ICD-10-CM | POA: Diagnosis present

## 2024-01-08 DIAGNOSIS — Z96651 Presence of right artificial knee joint: Secondary | ICD-10-CM | POA: Diagnosis present

## 2024-01-08 LAB — BASIC METABOLIC PANEL WITH GFR
Anion gap: 10 (ref 5–15)
BUN: 42 mg/dL — ABNORMAL HIGH (ref 8–23)
CO2: 26 mmol/L (ref 22–32)
Calcium: 8.6 mg/dL — ABNORMAL LOW (ref 8.9–10.3)
Chloride: 108 mmol/L (ref 98–111)
Creatinine, Ser: 1.88 mg/dL — ABNORMAL HIGH (ref 0.61–1.24)
GFR, Estimated: 37 mL/min — ABNORMAL LOW (ref >60)
Glucose, Bld: 121 mg/dL — ABNORMAL HIGH (ref 70–99)
Potassium: 3.6 mmol/L (ref 3.5–5.1)
Sodium: 144 mmol/L (ref 135–145)

## 2024-01-08 LAB — CBC WITH DIFFERENTIAL/PLATELET
Abs Immature Granulocytes: 0.03 K/uL (ref 0.00–0.07)
Basophils Absolute: 0 K/uL (ref 0.0–0.1)
Basophils Relative: 1 %
Eosinophils Absolute: 0.2 K/uL (ref 0.0–0.5)
Eosinophils Relative: 5 %
HCT: 38.5 % — ABNORMAL LOW (ref 39.0–52.0)
Hemoglobin: 12.5 g/dL — ABNORMAL LOW (ref 13.0–17.0)
Immature Granulocytes: 1 %
Lymphocytes Relative: 26 %
Lymphs Abs: 1.3 K/uL (ref 0.7–4.0)
MCH: 32.5 pg (ref 26.0–34.0)
MCHC: 32.5 g/dL (ref 30.0–36.0)
MCV: 100 fL (ref 80.0–100.0)
Monocytes Absolute: 0.5 K/uL (ref 0.1–1.0)
Monocytes Relative: 9 %
Neutro Abs: 3.1 K/uL (ref 1.7–7.7)
Neutrophils Relative %: 58 %
Platelets: 238 K/uL (ref 150–400)
RBC: 3.85 MIL/uL — ABNORMAL LOW (ref 4.22–5.81)
RDW: 15.1 % (ref 11.5–15.5)
WBC: 5.2 K/uL (ref 4.0–10.5)
nRBC: 0 % (ref 0.0–0.2)

## 2024-01-08 LAB — TSH: TSH: 1.02 u[IU]/mL (ref 0.350–4.500)

## 2024-01-08 LAB — MAGNESIUM: Magnesium: 2.2 mg/dL (ref 1.7–2.4)

## 2024-01-08 MED ORDER — ACETAMINOPHEN 325 MG PO TABS: 650.0000 mg | ORAL_TABLET | Freq: Four times a day (QID) | ORAL | Status: DC | PRN

## 2024-01-08 MED ORDER — POTASSIUM CHLORIDE CRYS ER 20 MEQ PO TBCR
40.0000 meq | EXTENDED_RELEASE_TABLET | Freq: Once | ORAL | Status: AC
  Administered 2024-01-08: 40 meq via ORAL
  Filled 2024-01-08: qty 2

## 2024-01-08 MED ORDER — ATROPINE SULFATE 1 MG/10ML IJ SOSY
0.5000 mg | PREFILLED_SYRINGE | Freq: Once | INTRAMUSCULAR | Status: AC
  Administered 2024-01-08: 0.5 mg via INTRAVENOUS
  Filled 2024-01-08: qty 10

## 2024-01-08 MED ORDER — TRAMADOL HCL 50 MG PO TABS: 50.0000 mg | ORAL_TABLET | Freq: Three times a day (TID) | ORAL | Status: DC | PRN

## 2024-01-08 MED ORDER — ACETAMINOPHEN 650 MG RE SUPP: 650.0000 mg | Freq: Four times a day (QID) | RECTAL | Status: DC | PRN

## 2024-01-08 MED ORDER — ONDANSETRON HCL 4 MG PO TABS: 4.0000 mg | ORAL_TABLET | Freq: Four times a day (QID) | ORAL | Status: DC | PRN

## 2024-01-08 MED ORDER — ONDANSETRON HCL 4 MG/2ML IJ SOLN: 4.0000 mg | Freq: Four times a day (QID) | INTRAMUSCULAR | Status: DC | PRN

## 2024-01-08 MED ORDER — POLYETHYLENE GLYCOL 3350 17 G PO PACK
17.0000 g | PACK | Freq: Every day | ORAL | Status: DC | PRN
Start: 2024-01-08 — End: 2024-01-10

## 2024-01-08 NOTE — ED Notes (Signed)
Zoll pads applied 
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
## 2024-01-08 NOTE — ED Provider Notes (Signed)
 Jonathan Harvey Provider Note   CSN: [id number] Arrival date & time: 01/08/24  8177     Patient presents with: No chief complaint on file.   Jonathan Harvey is a 78 y.o. male.   78 year old male presents today for concern of complete heart block.  He was checking his blood pressure earlier in the evening and his blood pressure machine return heart rate of 30 bpm.  He called EMS and they took an EKG which confirmed complete heart block and brought him in for evaluation.  He denies chest pain, lightheadedness, shortness of breath, palpitations.  The history is provided by the patient. No language interpreter was used.       Prior to Admission medications   Medication Sig Start Date End Date Taking? Authorizing Provider  aspirin  EC 81 MG tablet Take 81 mg by mouth daily.    [provider]  cetirizine-pseudoephedrine (ZYRTEC-D) 5-120 MG tablet Take 1 tablet by mouth at bedtime.    [provider]  Cholecalciferol (VITAMIN D3) 3000 units TABS Take 2,000 Units by mouth.    [provider]  Cyanocobalamin (VITAMIN B 12 PO) Take by mouth daily.    [provider]  furosemide  (LASIX ) 40 MG tablet Take 20 mg by mouth daily.     [provider]  gabapentin  (NEURONTIN ) 300 MG capsule Take 300 mg See admin instructions by mouth. Morning, Lunch,  (may take dose if needed at bedtime)    [provider]  losartan -hydrochlorothiazide  (HYZAAR) 100-25 MG tablet Take 1 tablet by mouth daily.    [provider]  metoprolol  succinate (TOPROL -XL) 25 MG 24 hr tablet Take 25 mg by mouth daily.    [provider]  Multiple Vitamin (MULTIVITAMIN) tablet Take 1 tablet by mouth daily.    [provider]  simvastatin  (ZOCOR ) 40 MG tablet Take 20 mg by mouth at bedtime.    [provider]  traMADol  (ULTRAM ) 50 MG tablet Take 1-2 tablets (50-100 mg total) by mouth every 6 (six) hours as  needed for moderate pain or severe pain (for pain). 01/30/17   Cory Palma, PA-C    Allergies: Lactose intolerance (gi)    Review of Systems  Constitutional:  Negative for chills and fever.  Respiratory:  Negative for shortness of breath.   Cardiovascular:  Negative for chest pain and palpitations.  Neurological:  Negative for light-headedness.  All other systems reviewed and are negative.   Updated Vital Signs BP (!) 132/52 (BP Location: Right Arm)   Pulse (!) 30   Temp (!) 97.5 F (36.4 C) (Oral)   Resp 12   Ht 5' 8 (1.727 m)   Wt 82.6 kg   SpO2 100%   BMI 27.67 kg/m   Physical Exam Vitals and nursing note reviewed.  Constitutional:      General: He is not in acute distress.    Appearance: Normal appearance. He is not ill-appearing.  HENT:     Head: Normocephalic and atraumatic.     Nose: Nose normal.  Eyes:     Conjunctiva/sclera: Conjunctivae normal.  Cardiovascular:     Rate and Rhythm: Regular rhythm. Bradycardia present.  Pulmonary:     Effort: Pulmonary effort is normal. No respiratory distress.  Musculoskeletal:        General: No deformity. Normal range of motion.     Cervical back: Normal range of motion.     Right lower leg: Edema (Trace pitting edema) present.  Left lower leg: Edema (Trace pitting edema) present.  Skin:    Findings: No rash.  Neurological:     General: No focal deficit present.     Mental Status: He is alert and oriented to person, place, and time. Mental status is at baseline.     (all labs ordered are listed, but only abnormal results are displayed) Labs Reviewed  CBC WITH DIFFERENTIAL/PLATELET - Abnormal; Notable for the following components:      Result Value   RBC 3.85 (*)    Hemoglobin 12.5 (*)    HCT 38.5 (*)    All other components within normal limits  BASIC METABOLIC PANEL WITH GFR - Abnormal; Notable for the following components:   Glucose, Bld 121 (*)    BUN 42 (*)    Creatinine, Ser 1.88 (*)    Calcium  8.6 (*)    GFR, Estimated 37 (*)    All other components within normal limits  MAGNESIUM  TSH    EKG: EKG Interpretation Date/Time:  Thursday January 08 2024 18:29:26 EST Ventricular Rate:  28 PR Interval:  162 QRS Duration:  171 QT Interval:  562 QTC Calculation: 383 R Axis:   -58  Text Interpretation: 3rd degree heart block Confirmed by Ula Barter [phone]) on 01/08/2024 6:32:12 PM  Radiology: No results found.   .Critical Care  Performed by: Hildegard Loge, PA-C Authorized by: Hildegard Loge, PA-C   Critical care provider statement:    Critical care time (minutes):  30   Critical care was necessary to treat or prevent imminent or life-threatening deterioration of the following conditions:  Cardiac failure   Critical care was time spent personally by me on the following activities:  Development of treatment plan with patient or surrogate, discussions with consultants, evaluation of patient's response to treatment, examination of patient, ordering and review of laboratory studies, ordering and review of radiographic studies, ordering and performing treatments and interventions, pulse oximetry, re-evaluation of patient's condition and review of old charts   Care discussed with: admitting provider      Medications Ordered in the ED  atropine 1 MG/10ML injection 0.5 mg (0.5 mg Intravenous Given 01/08/24 1847)                                    Medical Decision Making Amount and/or Complexity of Data Reviewed Labs: ordered.  Risk Prescription drug management. Decision regarding hospitalization.   Medical Decision Making / ED Course   This patient presents to the ED for concern of bradycardia, this involves an extensive number of treatment options, and is a complaint that carries with it a high risk of complications and morbidity.  The differential diagnosis includes complete heart block, second-degree block, sinus bradycardia  MDM: 78 year old male presents with above  complaints. Without acute distress.  However heart rate of about 30.  EKG shows evidence of complete heart block. Cardiac pads in place.  Will give him half a milligram of atropine. Cardiology consulted.  Recommend holding his metoprolol  and admitted to medicine service due to his comorbidities.  Discussed with hospitalist.  They will evaluate patient for admission.  Lab Tests: -I ordered, reviewed, and interpreted labs.   The pertinent results include:   Labs Reviewed  CBC WITH DIFFERENTIAL/PLATELET - Abnormal; Notable for the following components:      Result Value   RBC 3.85 (*)    Hemoglobin 12.5 (*)    HCT  38.5 (*)    All other components within normal limits  BASIC METABOLIC PANEL WITH GFR - Abnormal; Notable for the following components:   Glucose, Bld 121 (*)    BUN 42 (*)    Creatinine, Ser 1.88 (*)    Calcium 8.6 (*)    GFR, Estimated 37 (*)    All other components within normal limits  MAGNESIUM  TSH      EKG  EKG Interpretation Date/Time:  Thursday January 08 2024 18:29:26 EST Ventricular Rate:  28 PR Interval:  162 QRS Duration:  171 QT Interval:  562 QTC Calculation: 383 R Axis:   -58  Text Interpretation: 3rd degree heart block Confirmed by Ula Barter [phone]) on 01/08/2024 6:32:12 PM         Medicines ordered and prescription drug management: Meds ordered this encounter  Medications   atropine 1 MG/10ML injection 0.5 mg   potassium chloride  SA (KLOR-CON  M) CR tablet 40 mEq    -I have reviewed the patients home medicines and have made adjustments as needed  Critical interventions Atropine, cardiac pads, cardiology consult  Consultations Obtained: I requested consultation with the Dr. Sheena of cardiology,  and discussed lab and imaging findings as well as pertinent plan - they recommend: As above   Cardiac Monitoring: The patient was maintained on a cardiac monitor.  I personally viewed and interpreted the cardiac monitored which showed an  underlying rhythm of: Complete heart block with rates of about 30   Reevaluation: After the interventions noted above, I reevaluated the patient and found that they have :stayed the same  Co morbidities that complicate the patient evaluation  Past Medical History:  Diagnosis Date   Arthritis    all over (12/25/2016)   Chronic lower back pain    severe (12/25/2016)   Hepatitis B 2008   History of kidney stones    Hypertension    Hypertriglyceridemia    Prostate cancer (HCC) 10/2006   s/p robotic prostatectomy    Renal cell carcinoma, right (HCC)    Spinal stenosis       Dispostion: Discussed with hospitalist.  Patient will be admitted for further eval and management  Final diagnoses:  Complete heart block [facility])    ED Discharge Orders     None          Hildegard Loge, PA-C 01/08/24 2009    Ula Barter SAUNDERS, MD 01/08/24 2235

## 2024-01-08 NOTE — ED Triage Notes (Signed)
 Patient arrives via [facility] EMS for  third degree HB. Patient called EMS after he attempted to check his BP and noticed his HR was 30. Patient saw cards recently for possible pacemaker placement. HR 29-32, BP 140/80. Asymptomatic. Metoprolol  and losartan . 18 LAC. Patient found to be in third degree without history of same per patient. No chest pain, shortness of breath, diaphoresis. No meds given en route.

## 2024-01-08 NOTE — Consult Note (Signed)
 Cardiology Consultation   Patient ID: Jonathan Harvey MRN: [id number]; DOB: 03-15-1945  Admit date: 01/08/2024 Date of Consult: 01/08/2024  PCP:  Norita Miquel HERO, MD   [facility] Providers Cardiologist:  None        Patient Profile: Jonathan Harvey is a 78 y.o. male with a hx of bifascicular block, chronic pain, hypertension who is being seen 01/08/2024 for the evaluation of  at the request of Dr. Ula.  Chart reviewed the patient recently followed with [facility] cardiology at that time he presented for evaluation after syncope episode.  During that visit they suspected that his syncope was medicated related as he was on gabapentin  and Lyrica as well as tramadol  at the time.  He was encouraged to stop the gabapentin  and Lyrica.  At that time also on ZIO monitor was placed on the patient for 7 days.  I reviewed the report from the monitor and care everywhere I do not have access to the actual ECG from the recording.  The ZIO monitor show evidence of 1 pause that lasted 3.1 seconds at a heartbeat of 20 beats per second another pause due to high-grade AV block and episodes of 1211 second-degree Mobitz 2.  History of Present Illness: Mr. Pop presents today to the emergency department after he reported his heart rate being significantly low during the time he was doing his blood pressure.  He denies any significant chest pain or shortness of breath.  But the concern of the low heart rate brought him in.  On presentation he was noted to be in complete heart block.    Past Medical History:  Diagnosis Date   Arthritis    all over (12/25/2016)   Chronic lower back pain    severe (12/25/2016)   Hepatitis B 2008   History of kidney stones    Hypertension    Hypertriglyceridemia    Prostate cancer (HCC) 10/2006   s/p robotic prostatectomy    Renal cell carcinoma, right (HCC)    Spinal stenosis     Past Surgical History:  Procedure Laterality Date   CARPAL TUNNEL RELEASE  Right 2010   CYST EXCISION Left    elbow   FEMUR SURGERY Left ~ 1962   femur was growing crooked; broke it to straighten it out; body cast for ~ 4 months   FINGER SURGERY Right 1987   right thumb and index finger (pt denies this hx on 12/25/2016)   FRACTURE SURGERY     INGUINAL HERNIA REPAIR Left 2008   JOINT REPLACEMENT     MASTOIDECTOMY  1982   ORIF GREATER TROCHANTERIC FRACTURE Left 12/25/2016    ACETABULAR REVISION/notes 12/25/2016   PROSTATECTOMY  10/2006   ROBOTIC ASSITED PARTIAL NEPHRECTOMY Right 01/30/2017   Procedure: ROBOTIC ASSITED PARTIAL NEPHRECTOMY;  Surgeon: Cam Morene ORN, MD;  Location: [facility];  Service: Urology;  Laterality: Right;   TOTAL HIP ARTHROPLASTY Left 2002   TOTAL HIP REVISION Left 04/24/2016   Procedure: TOTAL HIP REVISION;  Surgeon: Dempsey Sensor, MD;  Location: [facility];  Service: Orthopedics;  Laterality: Left;   TOTAL HIP REVISION Left 12/25/2016   Procedure: OPEN REDUCTION INTERNAL FIXATION GREATER TROCHANTERIC FRACTURE, ACETABULAR REVISION;  Surgeon: Sensor Dempsey, MD;  Location: [facility];  Service: Orthopedics;  Laterality: Left;   TOTAL KNEE ARTHROPLASTY Right 08/26/2016   Procedure: RIGHT TOTAL KNEE ARTHROPLASTY;  Surgeon: Sensor Dempsey, MD;  Location: [facility];  Service: Orthopedics;  Laterality: Right;       Scheduled  Meds:  Continuous Infusions:  sodium chloride      PRN Meds:   Allergies:    Allergies  Allergen Reactions   Lactose Intolerance (Gi) Other (See Comments)    Mild GI upset    Social History:   Social History   Socioeconomic History   Marital status: Married    Spouse name: Not on file   Number of children: Not on file   Years of education: Not on file   Highest education level: Not on file  Occupational History   Not on file  Tobacco Use   Smoking status: Former    Types: Pipe    Quit date: 1991    Years since quitting: 34.8   Smokeless tobacco: Never  Vaping Use   Vaping status: Never Used  Substance and Sexual  Activity   Alcohol use: Yes    Alcohol/week: 3.0 standard drinks of alcohol    Types: 1 Glasses of wine, 2 Cans of beer per week    Comment: daily   Drug use: Not Currently    Types: Marijuana    Comment: as archivist   Sexual activity: Yes    Partners: Female  Other Topics Concern   Not on file  Social History Narrative   Not on file   Social Drivers of Health   Financial Resource Strain: Low Risk  (08/03/2022)   Received from [facility]   Overall Financial Resource Strain (CARDIA)    Difficulty of Paying Living Expenses: Not hard at all  Food Insecurity: No Food Insecurity (08/03/2022)   Received from [facility]   Hunger Vital Sign    Within the past 12 months, you worried that your food would run out before you got the money to buy more.: Never true    Within the past 12 months, the food you bought just didn't last and you didn't have money to get more.: Never true  Transportation Needs: No Transportation Needs (08/03/2022)   Received from [facility] - Transportation    Lack of Transportation (Medical): No    Lack of Transportation (Non-Medical): No  Physical Activity: Insufficiently Active (08/03/2022)   Received from [facility]   Exercise Vital Sign    On average, how many days per week do you engage in moderate to strenuous exercise (like a brisk walk)?: 2 days    On average, how many minutes do you engage in exercise at this level?: 20 min  Stress: No Stress Concern Present (08/03/2022)   Received from [facility] - Occupational Stress Questionnaire    Feeling of Stress : Not at all  Social Connections: Socially Integrated (08/03/2022)   Received from [facility]   Social Network    How would you rate your social network (family, work, friends)?: Good participation with social networks  Intimate Partner Violence: Not At Risk (08/03/2022)   Received from [facility]   HITS    Over the last 12 months  how often did your partner physically hurt you?: Never    Over the last 12 months how often did your partner insult you or talk down to you?: Never    Over the last 12 months how often did your partner threaten you with physical harm?: Never    Over the last 12 months how often did your partner scream or curse at you?: Never    Family History:   No family history on file.   ROS:  Please see the history of present illness.   All other ROS reviewed and negative.     Physical Exam/Data: Vitals:   01/08/24 1829 01/08/24 1831  BP:  (!) 132/52  Pulse:  (!) 30  Resp:  12  Temp:  (!) 97.5 F (36.4 C)  TempSrc:  Oral  SpO2:  100%  Weight: 82.6 kg   Height: 5' 8 (1.727 m)    No intake or output data in the 24 hours ending 01/08/24 1849    01/08/2024    6:29 PM 09/14/2019    6:59 PM 07/23/2017    9:38 AM  Last 3 Weights  Weight (lbs) 182 lb 191 lb 191 lb  Weight (kg) 82.555 kg 86.637 kg 86.637 kg     Body mass index is 27.67 kg/m.  General:  Well nourished, well developed, in no acute distress HEENT: normal Neck: no JVD Vascular: No carotid bruits; Distal pulses 2+ bilaterally Cardiac:  normal S1, S2; RRR; no murmur  Lungs:  clear to auscultation bilaterally, no wheezing, rhonchi or rales  Abd: soft, nontender, no hepatomegaly  Ext: no edema Musculoskeletal:  No deformities, BUE and BLE strength normal and equal Skin: warm and dry  Neuro:  CNs 2-12 intact, no focal abnormalities noted Psych:  Normal affect   EKG:  The EKG was personally reviewed and demonstrates: Complete A-V dissociation/third-degree AV block heart rate 28 bpm Telemetry:  Telemetry was personally reviewed and demonstrates:    Relevant CV Studies: Report from   Laboratory Data: High Sensitivity Troponin:  No results for input(s): TROPONINIHS in the last 720 hours.   ChemistryNo results for input(s): NA, K, CL, CO2, GLUCOSE, BUN, CREATININE, CALCIUM, MG, GFRNONAA, GFRAA, ANIONGAP  in the last 168 hours.  No results for input(s): PROT, ALBUMIN, AST, ALT, ALKPHOS, BILITOT in the last 168 hours. Lipids No results for input(s): CHOL, TRIG, HDL, LABVLDL, LDLCALC, CHOLHDL in the last 168 hours.  HematologyNo results for input(s): WBC, RBC, HGB, HCT, MCV, MCH, MCHC, RDW, PLT in the last 168 hours. Thyroid No results for input(s): TSH, FREET4 in the last 168 hours.  BNPNo results for input(s): BNP, PROBNP in the last 168 hours.  DDimer No results for input(s): DDIMER in the last 168 hours.  Radiology/Studies:  No results found.   Assessment and Plan: Complete A-V dissociation/ complete heart  Hypertensive heart disease  Hypertension   He is in complete A-V dissociation in the ED.  Agree with his transdermal pacer.  He has taking his beta-blocker this morning metoprolol  succinate 25 mg daily.  We are going to stop this and do a beta-blocker washout.  In the meantime though we will have our EP colleagues see him.   His recent ZIO monitor showed evidence of 1 pause that lasted 3.1 seconds at a heartbeat of 20 beats per second another pause due to high-grade AV block and episodes of 1211 second-degree Mobitz 2-this was in the setting of his beta-blocker use.  I was able to discuss with the patient and his wife about the plan as well as his recent findings.  He will benefit from echocardiogram will get 1 today.  His blood pressure with systolics in the 130s to 140s I will hold off on starting his antihypertensive medication unless if his blood pressure is greater than 150.  We will continue to follow with you.    For questions or updates, please contact [facility] Please consult [email or web address] for contact info under    Signed, Malynda Smolinski,  DO  01/08/2024 6:49 PM

## 2024-01-09 ENCOUNTER — Inpatient Hospital Stay (HOSPITAL_COMMUNITY)

## 2024-01-09 ENCOUNTER — Inpatient Hospital Stay (HOSPITAL_COMMUNITY)
Admission: EM | Disposition: A | Discharge status: Home / Self Care | Payer: Self-pay | Source: Home / Self Care | Attending: Internal Medicine

## 2024-01-09 DIAGNOSIS — I442 Atrioventricular block, complete: Secondary | ICD-10-CM | POA: Diagnosis not present

## 2024-01-09 HISTORY — PX: PACEMAKER IMPLANT: EP1218

## 2024-01-09 LAB — CBC
HCT: 38.4 % — ABNORMAL LOW (ref 39.0–52.0)
Hemoglobin: 12.6 g/dL — ABNORMAL LOW (ref 13.0–17.0)
MCH: 32.6 pg (ref 26.0–34.0)
MCHC: 32.8 g/dL (ref 30.0–36.0)
MCV: 99.2 fL (ref 80.0–100.0)
Platelets: 217 K/uL (ref 150–400)
RBC: 3.87 MIL/uL — ABNORMAL LOW (ref 4.22–5.81)
RDW: 14.9 % (ref 11.5–15.5)
WBC: 4.6 K/uL (ref 4.0–10.5)
nRBC: 0 % (ref 0.0–0.2)

## 2024-01-09 LAB — ECHOCARDIOGRAM COMPLETE
Height: 68 in
S' Lateral: 3.4 cm
Weight: 2912 [oz_av]

## 2024-01-09 LAB — BASIC METABOLIC PANEL WITH GFR
Anion gap: 14 (ref 5–15)
BUN: 41 mg/dL — ABNORMAL HIGH (ref 8–23)
CO2: 24 mmol/L (ref 22–32)
Calcium: 9.2 mg/dL (ref 8.9–10.3)
Chloride: 107 mmol/L (ref 98–111)
Creatinine, Ser: 1.76 mg/dL — ABNORMAL HIGH (ref 0.61–1.24)
GFR, Estimated: 40 mL/min — ABNORMAL LOW (ref >60)
Glucose, Bld: 101 mg/dL — ABNORMAL HIGH (ref 70–99)
Potassium: 4.1 mmol/L (ref 3.5–5.1)
Sodium: 145 mmol/L (ref 135–145)

## 2024-01-09 SURGERY — PACEMAKER IMPLANT

## 2024-01-09 MED ORDER — CHLORHEXIDINE GLUCONATE 4 % EX SOLN
60.0000 mL | Freq: Once | CUTANEOUS | Status: DC
  Filled 2024-01-09: qty 60

## 2024-01-09 MED ORDER — LIDOCAINE HCL 1 % IJ SOLN
INTRAMUSCULAR | Status: AC
  Filled 2024-01-09: qty 60

## 2024-01-09 MED ORDER — CEFAZOLIN SODIUM-DEXTROSE 1-4 GM/50ML-% IV SOLN
1.0000 g | Freq: Four times a day (QID) | INTRAVENOUS | Status: AC
  Administered 2024-01-09 – 2024-01-10 (×2): 1 g via INTRAVENOUS
  Filled 2024-01-09 (×2): qty 50

## 2024-01-09 MED ORDER — PERFLUTREN LIPID MICROSPHERE
1.0000 mL | INTRAVENOUS | Status: AC | PRN
  Administered 2024-01-09: 5 mL via INTRAVENOUS

## 2024-01-09 MED ORDER — CEFAZOLIN SODIUM-DEXTROSE 2-4 GM/100ML-% IV SOLN
2.0000 g | INTRAVENOUS | Status: AC
  Administered 2024-01-09: 2 g via INTRAVENOUS

## 2024-01-09 MED ORDER — ACETAMINOPHEN 325 MG PO TABS: 325.0000 mg | ORAL_TABLET | ORAL | Status: DC | PRN

## 2024-01-09 MED ORDER — FENTANYL CITRATE (PF) 100 MCG/2ML IJ SOLN
INTRAMUSCULAR | Status: DC | PRN
  Administered 2024-01-09 (×2): 12.5 ug via INTRAVENOUS

## 2024-01-09 MED ORDER — FENTANYL CITRATE (PF) 100 MCG/2ML IJ SOLN
INTRAMUSCULAR | Status: AC
  Filled 2024-01-09: qty 2

## 2024-01-09 MED ORDER — MIDAZOLAM HCL 2 MG/2ML IJ SOLN
INTRAMUSCULAR | Status: AC
  Filled 2024-01-09: qty 2

## 2024-01-09 MED ORDER — SODIUM CHLORIDE 0.9 % IV SOLN: INTRAVENOUS | Status: DC

## 2024-01-09 MED ORDER — SODIUM CHLORIDE 0.9 % IV SOLN
INTRAVENOUS | Status: AC
  Filled 2024-01-09: qty 2

## 2024-01-09 MED ORDER — SODIUM CHLORIDE 0.9 % IV SOLN
80.0000 mg | INTRAVENOUS | Status: AC
  Administered 2024-01-09: 80 mg
  Filled 2024-01-09: qty 2

## 2024-01-09 MED ORDER — MIDAZOLAM HCL 5 MG/5ML IJ SOLN
INTRAMUSCULAR | Status: DC | PRN
  Administered 2024-01-09 (×2): 1 mg via INTRAVENOUS

## 2024-01-09 MED ORDER — CEFAZOLIN SODIUM-DEXTROSE 2-4 GM/100ML-% IV SOLN
INTRAVENOUS | Status: AC
  Filled 2024-01-09: qty 100

## 2024-01-09 SURGICAL SUPPLY — 12 items
CABLE SURGICAL S-101-97-12 (CABLE) ×1 IMPLANT
CATH CPS LOCATOR 3D MED (CATHETERS) IMPLANT
LEAD ULTIPACE 52 LPA1231/52 (Lead) IMPLANT
LEAD ULTIPACE 65 LPA1231/65 (Lead) IMPLANT
PACEMAKER ASSURITY DR-RF (Pacemaker) IMPLANT
PAD DEFIB RADIO PHYSIO CONN (PAD) ×1 IMPLANT
SHEATH 7FR PRELUDE SNAP 13 (SHEATH) IMPLANT
SHEATH 9.5FR PRELUDE SNAP 13 (SHEATH) IMPLANT
SLITTER AGILIS HISPRO (INSTRUMENTS) IMPLANT
TOOL HELIX LOCKING (MISCELLANEOUS) IMPLANT
TRAY PACEMAKER INSERTION (PACKS) ×1 IMPLANT
WIRE HI TORQ VERSACORE-J 145CM (WIRE) IMPLANT
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
## 2024-01-09 NOTE — ED Notes (Signed)
 CCMD called to report patient's transfer to 6E25.

## 2024-01-09 NOTE — ED Notes (Signed)
 Help patient stand and use the urinal patient has call bell in reach
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
## 2024-01-09 NOTE — Discharge Instructions (Signed)
 After Your Pacemaker   You have a Abbott Pacemaker  If you have a Medtronic or Biotronik device, plug in your home monitor once you get home, and no manual interaction is required.   If you have an Abbott or [employer]one device, plug your home monitor once you get home, sit near the device, and press the large activation button. Sit nearby until the process is complete, usually notated by lights on the monitor.   If you were set up for monitoring using an app on your phone, make sure the app remains open in the background and the Bluetooth remains on.  ACTIVITY Do not lift your arm above shoulder height for 1 week after your procedure. After 7 days, you may progress as below.  You should remove your sling 24 hours after your procedure, unless otherwise instructed by your provider.     Friday January 16, 2024  Saturday January 17, 2024 Sunday January 18, 2024 Monday January 19, 2024   Do not lift, push, pull, or carry anything over 10 pounds with the affected arm until 6 weeks (Friday February 20, 2024 ) after your procedure.   You may drive AFTER your wound check, unless you have been told otherwise by your provider.   Ask your healthcare provider when you can go back to work   INCISION/Dressing If you are on a blood thinner such as Coumadin, Xarelto, Eliquis, Plavix, or Pradaxa please confirm with your provider when this should be resumed.   If large square, outer bandage is left in place, this can be removed after 24 hours from your procedure. Do not remove steri-strips or glue as below.   If a PRESSURE DRESSING (a bulky dressing that usually goes up over your shoulder) was applied or left in place, please follow instructions given by your provider on when to return to have this removed.   Monitor your Pacemaker site for redness, swelling, and drainage. Call the device clinic at [phone] if you experience these symptoms or fever/chills.  If your incision is sealed  with Steri-strips or staples, you may shower 7 days after your procedure or when told by your provider. Do not remove the steri-strips or let the shower hit directly on your site. You may wash around your site with soap and water .    If you were discharged in a sling, please do not wear this during the day more than 48 hours after your surgery unless otherwise instructed. This may increase the risk of stiffness and soreness in your shoulder.   Avoid lotions, ointments, or perfumes over your incision until it is well-healed.  You may use a hot tub or a pool AFTER your wound check appointment if the incision is completely closed.  Pacemaker Alerts:  Some alerts are vibratory and others beep. These are NOT emergencies. Please call our office to let us  know. If this occurs at night or on weekends, it can wait until the next business day. Send a remote transmission.  If your device is capable of reading fluid status (for heart failure), you will be offered monthly monitoring to review this with you.   DEVICE MANAGEMENT Remote monitoring is used to monitor your pacemaker from home. This monitoring is scheduled every 91 days by our office. It allows us  to keep an eye on the functioning of your device to ensure it is working properly. You will routinely see your Electrophysiologist annually (more often if necessary).  This will appear as a REMOTE check on your  MyChart schedule. These are automatic and there is nothing for you to manually do unless otherwise instructed.  You should receive your ID card for your new device in 4-8 weeks. Keep this card with you at all times once received. Consider wearing a medical alert bracelet or necklace.  Your Pacemaker may be MRI compatible. This will be discussed at your next office visit/wound check.  You should avoid contact with strong electric or magnetic fields.   Do not use amateur (ham) radio equipment or electric (arc) welding torches. MP3 player headphones  with magnets should not be used. Some devices are safe to use if held at least 12 inches (30 cm) from your Pacemaker. These include power tools, lawn mowers, and speakers. If you are unsure if something is safe to use, ask your health care provider.  When using your cell phone, hold it to the ear that is on the opposite side from the Pacemaker. Do not leave your cell phone in a pocket over the Pacemaker.  You may safely use electric blankets, heating pads, computers, and microwave ovens.  Call the office right away if: You have chest pain. You feel more short of breath than you have felt before. You feel more light-headed than you have felt before. Your incision starts to open up.  This information is not intended to replace advice given to you by your health care provider. Make sure you discuss any questions you have with your health care provider.

## 2024-01-09 NOTE — Progress Notes (Signed)
  Echocardiogram 2D Echocardiogram has been performed.  Koleen KANDICE Popper, RDCS 01/09/2024, 11:59 AM

## 2024-01-10 ENCOUNTER — Inpatient Hospital Stay (HOSPITAL_COMMUNITY)

## 2024-01-10 ENCOUNTER — Encounter (HOSPITAL_COMMUNITY): Payer: Self-pay | Admitting: Internal Medicine

## 2024-01-10 DIAGNOSIS — I442 Atrioventricular block, complete: Secondary | ICD-10-CM | POA: Diagnosis not present

## 2024-01-10 MED ORDER — FUROSEMIDE 40 MG PO TABS: 40.0000 mg | ORAL_TABLET | Freq: Every day | ORAL | Status: AC | PRN

## 2024-01-10 NOTE — Progress Notes (Signed)
Discharge instructions reviewed  with patient utilizing teach back method no questions at this time.  °

## 2024-01-10 NOTE — Progress Notes (Addendum)
  Progress Note  Patient Name: Jonathan Harvey Date of Encounter: 01/10/2024 [facility] Cardiologist: None   Interval Summary   Dual-chamber pacemaker implant yesterday. No acute overnight events. Patient reports feeling relatively well. No new or acute complaints.   Vital Signs Vitals:   01/09/24 2013 01/10/24 0012 01/10/24 0403 01/10/24 0810  BP: (!) 138/59  131/67 (!) 119/92  Pulse: 75  95   Resp: 20 20  16   Temp: 97.9 F (36.6 C) 97.9 F (36.6 C) 98.2 F (36.8 C) 98.3 F (36.8 C)  TempSrc: Oral Oral Oral Oral  SpO2: 98%  94%   Weight:      Height:        Intake/Output Summary (Last 24 hours) at 01/10/2024 0950 Last data filed at 01/10/2024 0945 Gross per 24 hour  Intake 587 ml  Output 1150 ml  Net -563 ml      01/08/2024    6:29 PM 09/14/2019    6:59 PM 07/23/2017    9:38 AM  Last 3 Weights  Weight (lbs) 182 lb 191 lb 191 lb  Weight (kg) 82.555 kg 86.637 kg 86.637 kg      Telemetry/ECG  AS-VP - Personally Reviewed  Physical Exam  General: Well developed, in no acute distress.  Neck: No JVD.  Cardiac: Normal rate, regular rhythm.  Left chest pacer site with dried blood, no active bleeding or hematoma. Resp: Normal work of breathing.  Ext: No edema.  Neuro: No gross focal deficits.  Psych: Normal affect.   Assessment & Plan  #S/p dual chamber pacemaker 11/7 #CHB #Syncope - Chest x-ray with appropriate lead positions.  Formal read still pending. - Device interrogation performed today.  Appropriate device function and stable lead parameters. VP >99%. - Usual post implant discharge instructions regarding activity restrictions and wound care were provided. - Follow-up in device clinic in 10 to 14 days.  If final CXR report shows no pneumothorax then okay to discharge from EP perspective.  Signed, Fonda Kitty, MD
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
## 2024-01-12 ENCOUNTER — Encounter: Payer: Self-pay | Admitting: Emergency Medicine

## 2024-01-12 ENCOUNTER — Encounter (HOSPITAL_COMMUNITY): Payer: Self-pay | Admitting: Internal Medicine

## 2024-01-12 MED FILL — Midazolam HCl Inj 2 MG/2ML (Base Equivalent): INTRAMUSCULAR | Qty: 2 | Status: AC

## 2024-01-12 MED FILL — Lidocaine HCl Local Preservative Free (PF) Inj 1%: INTRAMUSCULAR | Qty: 30 | Status: AC

## 2024-01-14 ENCOUNTER — Telehealth: Payer: Self-pay

## 2024-01-14 NOTE — Telephone Encounter (Signed)
 Follow-up after same day discharge: Implant date: 03/11/2023 MD: GT Device: PPM Location: L chest    Wound check visit: 01/22/2024 90 day MD follow-up: 04/16/2024  Remote Transmission received:yes   Dressing/sling removed: n/a  Confirm OAC restart on: n/a  Please continue to monitor your cardiac device site for redness, swelling, and drainage. Call the device clinic at [phone] if you experience these symptoms, fever/chills, or have questions about your device.   Remote monitoring is used to monitor your cardiac device from home. This monitoring is scheduled every 91 days by our office. It allows us  to keep an eye on the functioning of your device to ensure it is working properly.

## 2024-01-22 ENCOUNTER — Ambulatory Visit: Attending: Cardiovascular Disease

## 2024-01-22 DIAGNOSIS — I442 Atrioventricular block, complete: Secondary | ICD-10-CM

## 2024-01-22 NOTE — Patient Instructions (Signed)
  After Your Pacemaker   Monitor your pacemaker site for redness, swelling, and drainage. Call the device clinic at [phone] if you experience these symptoms or fever/chills.  Your incision was closed with Steri-strips or staples:  You may shower 7 days after your procedure and wash your incision with soap and water . Avoid lotions, ointments, or perfumes over your incision until it is well-healed.  You may use a hot tub or a pool after your wound check appointment if the incision is completely closed.  Do not lift, push or pull greater than 10 pounds with the affected arm until [facility]. There are no other restrictions in arm movement after your wound check appointment.  You may drive, unless driving has been restricted by your healthcare providers.  Remote monitoring is used to monitor your pacemaker from home. This monitoring is scheduled every 91 days by our office. It allows us  to keep an eye on the functioning of your device to ensure it is working properly. You will routinely see your Electrophysiologist annually (more often if necessary).

## 2024-01-23 ENCOUNTER — Telehealth: Payer: Self-pay

## 2024-01-23 LAB — CUP PACEART INCLINIC DEVICE CHECK
Battery Remaining Longevity: 115 mo
Battery Voltage: 3.1 V
Brady Statistic RA Percent Paced: 0.25 %
Brady Statistic RV Percent Paced: 99.68 %
Date Time Interrogation Session: 20251120161400
Implantable Lead Connection Status: 753985
Implantable Lead Connection Status: 753985
Implantable Lead Implant Date: 20251107
Implantable Lead Implant Date: 20251107
Implantable Lead Location: 753859
Implantable Lead Location: 753860
Implantable Pulse Generator Implant Date: 20251107
Lead Channel Impedance Value: 462.5 Ohm
Lead Channel Impedance Value: 512.5 Ohm
Lead Channel Pacing Threshold Amplitude: 0.5 V
Lead Channel Pacing Threshold Amplitude: 0.5 V
Lead Channel Pacing Threshold Amplitude: 1 V
Lead Channel Pacing Threshold Pulse Width: 0.5 ms
Lead Channel Pacing Threshold Pulse Width: 0.5 ms
Lead Channel Pacing Threshold Pulse Width: 0.5 ms
Lead Channel Sensing Intrinsic Amplitude: 12 mV
Lead Channel Sensing Intrinsic Amplitude: 3.4 mV
Lead Channel Setting Pacing Amplitude: 1.25 V
Lead Channel Setting Pacing Amplitude: 3.5 V
Lead Channel Setting Pacing Pulse Width: 0.5 ms
Lead Channel Setting Sensing Sensitivity: 4 mV
Pulse Gen Model: 2272
Pulse Gen Serial Number: 8328247

## 2024-01-23 NOTE — Progress Notes (Signed)
 Normal DUAL chamber pacemaker wound check. Presenting rhythm: AS/VP 97. Wound well healed, small amount of swelling present at pocket that is healing. No signs of infection, wound edges well approximated, dermabond is in place.  Dr. Almetta evaluated wound site and confirms appropriate healing.  Routine testing performed. Thresholds, sensing, and impedance consistent with implant measurements and at 3.5V safety margin/auto capture until 3 month visit. No episodes. Reviewed arm restrictions to continue for 6 weeks total post op.  Pt enrolled in remote follow-up.  Patient with numerous questions regarding device and medications.  1.5 hours spent with patient addressing concerns and medication guidance provided by Dr. Almetta.  Of note, patient will resume Furosemide  daily as he feels he needs it, on days he takes his Furosmide, he will take the Potassium.

## 2024-01-23 NOTE — Telephone Encounter (Signed)
Attempted to contact patient. No answer, left message to call back

## 2024-01-23 NOTE — Telephone Encounter (Signed)
 Pt wants to know if he can start lifting things, he forgot to ask on wound check and about his lyrica , he seems to need it 3 times daily and he doesn't understand why it went to one a day. Pt thinks that he needs to have it more than that

## 2024-01-23 NOTE — Telephone Encounter (Signed)
 Certified letter sent

## 2024-01-26 NOTE — Telephone Encounter (Signed)
 Spoke with patient.   Reviewed lifting restrictions and ask him to follow back up with his PCP regarding the Lyrica, unsure why it was decreased in the hospital.  Should not be an issue from a device perspective, but would recommend he contact primary about going back on the 2-3 per day dosing as he was taking prior.

## 2024-02-10 ENCOUNTER — Ambulatory Visit: Payer: Self-pay | Admitting: Student in an Organized Health Care Education/Training Program

## 2024-02-12 ENCOUNTER — Telehealth: Payer: Self-pay

## 2024-02-12 NOTE — Telephone Encounter (Signed)
 Mychart message sent to the patient .

## 2024-02-12 NOTE — Telephone Encounter (Signed)
 Pt called in stating that he received the letter we sent about disconnected moniter. Pt is sending in transmission and states his HR has been in the high 80's and would like to know if he should be concerned. Pt does not feel bad

## 2024-02-20 ENCOUNTER — Ambulatory Visit

## 2024-02-20 DIAGNOSIS — I442 Atrioventricular block, complete: Secondary | ICD-10-CM

## 2024-02-21 LAB — CUP PACEART REMOTE DEVICE CHECK
Battery Remaining Longevity: 98 mo
Battery Remaining Percentage: 95.5 %
Battery Voltage: 3.04 V
Brady Statistic AP VP Percent: 4 %
Brady Statistic AP VS Percent: 1 %
Brady Statistic AS VP Percent: 96 %
Brady Statistic AS VS Percent: 1 %
Brady Statistic RA Percent Paced: 3.7 %
Brady Statistic RV Percent Paced: 99 %
Date Time Interrogation Session: 20251219020013
Implantable Lead Connection Status: 753985
Implantable Lead Connection Status: 753985
Implantable Lead Implant Date: 20251107
Implantable Lead Implant Date: 20251107
Implantable Lead Location: 753859
Implantable Lead Location: 753860
Implantable Pulse Generator Implant Date: 20251107
Lead Channel Impedance Value: 430 Ohm
Lead Channel Impedance Value: 440 Ohm
Lead Channel Pacing Threshold Amplitude: 0.5 V
Lead Channel Pacing Threshold Amplitude: 0.75 V
Lead Channel Pacing Threshold Pulse Width: 0.5 ms
Lead Channel Pacing Threshold Pulse Width: 0.5 ms
Lead Channel Sensing Intrinsic Amplitude: 12 mV
Lead Channel Sensing Intrinsic Amplitude: 2.9 mV
Lead Channel Setting Pacing Amplitude: 1 V
Lead Channel Setting Pacing Amplitude: 3.5 V
Lead Channel Setting Pacing Pulse Width: 0.5 ms
Lead Channel Setting Sensing Sensitivity: 4 mV
Pulse Gen Model: 2272
Pulse Gen Serial Number: 8328247

## 2024-02-23 NOTE — Progress Notes (Signed)
 Remote PPM Transmission

## 2024-02-27 ENCOUNTER — Ambulatory Visit: Payer: Self-pay | Admitting: Student in an Organized Health Care Education/Training Program

## 2024-03-16 ENCOUNTER — Other Ambulatory Visit (HOSPITAL_COMMUNITY): Payer: Self-pay | Admitting: Urology

## 2024-03-16 ENCOUNTER — Ambulatory Visit (HOSPITAL_COMMUNITY)
Admission: RE | Admit: 2024-03-16 | Discharge: 2024-03-16 | Disposition: A | Discharge status: Home / Self Care | Source: Ambulatory Visit | Attending: Urology | Admitting: Urology

## 2024-03-16 DIAGNOSIS — C642 Malignant neoplasm of left kidney, except renal pelvis: Secondary | ICD-10-CM | POA: Insufficient documentation

## 2024-04-16 ENCOUNTER — Ambulatory Visit: Admitting: Student in an Organized Health Care Education/Training Program

## 2024-05-21 ENCOUNTER — Ambulatory Visit

## 2024-08-20 ENCOUNTER — Ambulatory Visit
